# Patient Record
Sex: Female | Born: 1960
Health system: Southern US, Community
[De-identification: ages and names within clinical notes are randomized; demographics above are authoritative.]

## PROBLEM LIST (undated history)

## (undated) DIAGNOSIS — G0490 Encephalitis and encephalomyelitis, unspecified: Secondary | ICD-10-CM

## (undated) DIAGNOSIS — G40409 Other generalized epilepsy and epileptic syndromes, not intractable, without status epilepticus: Secondary | ICD-10-CM

## (undated) DIAGNOSIS — E785 Hyperlipidemia, unspecified: Secondary | ICD-10-CM

## (undated) DIAGNOSIS — G039 Meningitis, unspecified: Secondary | ICD-10-CM

## (undated) DIAGNOSIS — E039 Hypothyroidism, unspecified: Secondary | ICD-10-CM

## (undated) DIAGNOSIS — E7800 Pure hypercholesterolemia, unspecified: Secondary | ICD-10-CM

## (undated) DIAGNOSIS — K219 Gastro-esophageal reflux disease without esophagitis: Secondary | ICD-10-CM

## (undated) DIAGNOSIS — G43909 Migraine, unspecified, not intractable, without status migrainosus: Secondary | ICD-10-CM

## (undated) DIAGNOSIS — K909 Intestinal malabsorption, unspecified: Secondary | ICD-10-CM

## (undated) DIAGNOSIS — R011 Cardiac murmur, unspecified: Secondary | ICD-10-CM

## (undated) DIAGNOSIS — K8020 Calculus of gallbladder without cholecystitis without obstruction: Secondary | ICD-10-CM

## (undated) DIAGNOSIS — R233 Spontaneous ecchymoses: Secondary | ICD-10-CM

## (undated) DIAGNOSIS — K760 Fatty (change of) liver, not elsewhere classified: Secondary | ICD-10-CM

## (undated) DIAGNOSIS — E119 Type 2 diabetes mellitus without complications: Secondary | ICD-10-CM

## (undated) DIAGNOSIS — D509 Iron deficiency anemia, unspecified: Secondary | ICD-10-CM

## (undated) DIAGNOSIS — H409 Unspecified glaucoma: Secondary | ICD-10-CM

## (undated) DIAGNOSIS — H269 Unspecified cataract: Secondary | ICD-10-CM

## (undated) DIAGNOSIS — E042 Nontoxic multinodular goiter: Secondary | ICD-10-CM

## (undated) DIAGNOSIS — R42 Dizziness and giddiness: Secondary | ICD-10-CM

## (undated) DIAGNOSIS — O139 Gestational [pregnancy-induced] hypertension without significant proteinuria, unspecified trimester: Secondary | ICD-10-CM

## (undated) DIAGNOSIS — G4730 Sleep apnea, unspecified: Secondary | ICD-10-CM

## (undated) DIAGNOSIS — J189 Pneumonia, unspecified organism: Secondary | ICD-10-CM

## (undated) DIAGNOSIS — R58 Hemorrhage, not elsewhere classified: Secondary | ICD-10-CM

## (undated) DIAGNOSIS — J42 Unspecified chronic bronchitis: Secondary | ICD-10-CM

## (undated) DIAGNOSIS — K429 Umbilical hernia without obstruction or gangrene: Secondary | ICD-10-CM

## (undated) DIAGNOSIS — R238 Other skin changes: Secondary | ICD-10-CM

## (undated) DIAGNOSIS — Z9289 Personal history of other medical treatment: Secondary | ICD-10-CM

## (undated) HISTORY — DX: Hyperlipidemia, unspecified: E78.5

## (undated) HISTORY — PX: WISDOM TOOTH EXTRACTION: SHX21

## (undated) HISTORY — DX: Hemorrhage, not elsewhere classified: R58

## (undated) HISTORY — DX: Unspecified cataract: H26.9

## (undated) HISTORY — DX: Calculus of gallbladder without cholecystitis without obstruction: K80.20

## (undated) HISTORY — PX: WRIST SURGERY: SHX841

## (undated) HISTORY — DX: Unspecified glaucoma: H40.9

## (undated) HISTORY — DX: Intestinal malabsorption, unspecified: K90.9

## (undated) HISTORY — DX: Nontoxic multinodular goiter: E04.2

## (undated) HISTORY — PX: COLONOSCOPY: SHX174

## (undated) HISTORY — DX: Cardiac murmur, unspecified: R01.1

## (undated) HISTORY — PX: DILATION AND CURETTAGE OF UTERUS: SHX78

## (undated) HISTORY — DX: Fatty (change of) liver, not elsewhere classified: K76.0

## (undated) HISTORY — PX: TONSILLECTOMY AND ADENOIDECTOMY: SUR1326

## (undated) HISTORY — DX: Pure hypercholesterolemia, unspecified: E78.00

## (undated) HISTORY — DX: Umbilical hernia without obstruction or gangrene: K42.9

## (undated) HISTORY — PX: OTHER SURGICAL HISTORY: SHX169

---

## 1987-06-04 HISTORY — PX: CARPAL TUNNEL RELEASE: SHX101

## 1994-06-03 DIAGNOSIS — O139 Gestational [pregnancy-induced] hypertension without significant proteinuria, unspecified trimester: Secondary | ICD-10-CM

## 1994-06-03 HISTORY — DX: Gestational (pregnancy-induced) hypertension without significant proteinuria, unspecified trimester: O13.9

## 1995-06-04 HISTORY — PX: HERNIA REPAIR: SHX51

## 1997-06-03 DIAGNOSIS — G40409 Other generalized epilepsy and epileptic syndromes, not intractable, without status epilepticus: Secondary | ICD-10-CM

## 1997-06-03 DIAGNOSIS — G0490 Encephalitis and encephalomyelitis, unspecified: Secondary | ICD-10-CM

## 1997-06-03 HISTORY — PX: KNEE ARTHROSCOPY W/ MENISCAL REPAIR: SHX1877

## 1997-06-03 HISTORY — DX: Encephalitis and encephalomyelitis, unspecified: G04.90

## 1997-06-03 HISTORY — PX: TUBAL LIGATION: SHX77

## 1997-06-03 HISTORY — DX: Other generalized epilepsy and epileptic syndromes, not intractable, without status epilepticus: G40.409

## 1999-06-04 HISTORY — PX: REDUCTION MAMMAPLASTY: SUR839

## 2006-06-03 DIAGNOSIS — Z9289 Personal history of other medical treatment: Secondary | ICD-10-CM

## 2006-06-03 DIAGNOSIS — D509 Iron deficiency anemia, unspecified: Secondary | ICD-10-CM

## 2006-06-03 HISTORY — DX: Iron deficiency anemia, unspecified: D50.9

## 2006-06-03 HISTORY — PX: ABDOMINAL HYSTERECTOMY: SHX81

## 2006-06-03 HISTORY — DX: Personal history of other medical treatment: Z92.89

## 2007-06-04 HISTORY — PX: OVARIAN CYST REMOVAL: SHX89

## 2008-06-03 DIAGNOSIS — F4321 Adjustment disorder with depressed mood: Secondary | ICD-10-CM

## 2008-06-03 HISTORY — DX: Adjustment disorder with depressed mood: F43.21

## 2009-07-12 ENCOUNTER — Encounter: Admission: RE | Admit: 2009-07-12 | Discharge: 2009-07-12 | Payer: Self-pay | Admitting: Family Medicine

## 2010-03-01 ENCOUNTER — Encounter: Admission: RE | Admit: 2010-03-01 | Discharge: 2010-03-01 | Payer: Self-pay | Admitting: Family Medicine

## 2011-09-17 ENCOUNTER — Other Ambulatory Visit: Payer: Self-pay | Admitting: Endocrinology

## 2011-09-17 DIAGNOSIS — E049 Nontoxic goiter, unspecified: Secondary | ICD-10-CM

## 2011-11-04 ENCOUNTER — Ambulatory Visit
Admission: RE | Admit: 2011-11-04 | Discharge: 2011-11-04 | Disposition: A | Discharge status: Home / Self Care | Payer: BC Managed Care – PPO | Source: Ambulatory Visit | Attending: Endocrinology | Admitting: Endocrinology

## 2011-11-04 DIAGNOSIS — E049 Nontoxic goiter, unspecified: Secondary | ICD-10-CM

## 2012-01-01 ENCOUNTER — Other Ambulatory Visit: Payer: Self-pay | Admitting: Family Medicine

## 2012-01-01 DIAGNOSIS — R7989 Other specified abnormal findings of blood chemistry: Secondary | ICD-10-CM

## 2012-01-22 ENCOUNTER — Ambulatory Visit
Admission: RE | Admit: 2012-01-22 | Discharge: 2012-01-22 | Disposition: A | Discharge status: Home / Self Care | Payer: BC Managed Care – PPO | Source: Ambulatory Visit | Attending: Family Medicine | Admitting: Family Medicine

## 2012-01-22 DIAGNOSIS — R7989 Other specified abnormal findings of blood chemistry: Secondary | ICD-10-CM

## 2012-03-13 ENCOUNTER — Ambulatory Visit
Admission: RE | Admit: 2012-03-13 | Discharge: 2012-03-13 | Disposition: A | Discharge status: Home / Self Care | Payer: BC Managed Care – PPO | Source: Ambulatory Visit | Attending: Family Medicine | Admitting: Family Medicine

## 2012-03-13 ENCOUNTER — Other Ambulatory Visit: Payer: Self-pay | Admitting: Family Medicine

## 2012-03-13 DIAGNOSIS — R1012 Left upper quadrant pain: Secondary | ICD-10-CM

## 2012-03-13 DIAGNOSIS — R1011 Right upper quadrant pain: Secondary | ICD-10-CM

## 2012-03-13 MED ORDER — IOHEXOL 300 MG/ML  SOLN
125.0000 mL | Freq: Once | INTRAMUSCULAR | Status: AC | PRN
  Administered 2012-03-13: 125 mL via INTRAVENOUS

## 2012-03-20 ENCOUNTER — Ambulatory Visit (INDEPENDENT_AMBULATORY_CARE_PROVIDER_SITE_OTHER): Payer: BC Managed Care – PPO | Admitting: General Surgery

## 2012-04-02 ENCOUNTER — Encounter (INDEPENDENT_AMBULATORY_CARE_PROVIDER_SITE_OTHER): Payer: Self-pay | Admitting: General Surgery

## 2012-04-02 ENCOUNTER — Ambulatory Visit (INDEPENDENT_AMBULATORY_CARE_PROVIDER_SITE_OTHER): Payer: BC Managed Care – PPO | Admitting: General Surgery

## 2012-04-02 VITALS — BP 120/76 | HR 88 | Temp 97.9°F | Resp 14 | Ht 62.0 in | Wt 225.6 lb

## 2012-04-02 DIAGNOSIS — K8020 Calculus of gallbladder without cholecystitis without obstruction: Secondary | ICD-10-CM

## 2012-04-02 NOTE — Progress Notes (Signed)
No chief complaint on file.   HISTORY:  Briana Kelley is a 51 y.o. female who presents to clinic with upper abdominal pain.  It is not associated with what she eats.  It is located in her LUQ, mid-epigastrum and RUQ.  It is associated with bloating and some constipation. This has been going on for the past month.  She states that she recently had an EGD and was told her stomach lining was inflamed.  She has been on a PPI for 3 weeks with little change in her symptoms.  She states that she is not able to eat a large amount at any meal due to pain.  She has an extensive surgical history with multiple pelvic surgeries.  The last surgery resulted in a bowel injury that was drained and healed on it's own.    Past Medical History  Diagnosis Date  . Anemia   . Blood transfusion without reported diagnosis   . Diabetes mellitus without complication   . Heart murmur   . Hyperlipidemia   . Seizures   . Thyroid disease        Past Surgical History  Procedure Date  . Abdominal hysterectomy 2008    partial  . Cesarean section [id number]  . Breast surgery 2001    breast reduction  . Hernia repair 1997    umbilical  . Ovarian cyst removal 2009      Current Outpatient Prescriptions  Medication Sig Dispense Refill  . ARMOUR THYROID 90 MG tablet       . atorvastatin (LIPITOR) 10 MG tablet       . butalbital-acetaminophen-caffeine (FIORICET WITH CODEINE) 50-325-40-30 MG per capsule Take 1 capsule by mouth every 4 (four) hours as needed.      . Cholecalciferol (VITAMIN D PO) Take by mouth.      . Flaxseed, Linseed, (FLAX SEED OIL PO) Take by mouth.      Marland Kitchen glimepiride (AMARYL) 2 MG tablet       . KOMBIGLYZE XR 2.10-998 MG TB24       . lisinopril (PRINIVIL,ZESTRIL) 5 MG tablet       . omeprazole (PRILOSEC) 40 MG capsule       . thyroid (ARMOUR) 120 MG tablet Take 120 mg by mouth daily.      . verapamil (CALAN-SR) 240 MG CR tablet       . amitriptyline (ELAVIL) 25 MG tablet          Allergies    Allergen Reactions  . Asa (Aspirin) Shortness Of Breath  . Celebrex (Celecoxib) Shortness Of Breath  . Ultram (Tramadol) Shortness Of Breath  . Pamelor (Nortriptyline Hcl) Other (See Comments)    Jaw pain   . Percocet (Oxycodone-Acetaminophen) Other (See Comments)    halucinations      Family History  Problem Relation Age of Onset  . Breast cancer Mother       History   Social History  . Marital Status: Divorced    Spouse Name: N/A    Number of Children: N/A  . Years of Education: N/A   Social History Main Topics  . Smoking status: Never Smoker   . Smokeless tobacco: Not on file  . Alcohol Use: No  . Drug Use: No  . Sexually Active: Not on file   Other Topics Concern  . Not on file   Social History Narrative  . No narrative on file       REVIEW OF SYSTEMS - PERTINENT POSITIVES ONLY: Review  of Systems - General ROS: negative for - chills, fever or weight loss Hematological and Lymphatic ROS: negative for - bleeding problems, blood clots or jaundice Respiratory ROS: no cough, shortness of breath, or wheezing Cardiovascular ROS: no chest pain or dyspnea on exertion Gastrointestinal ROS: positive for - abdominal pain, constipation and nausea negative for - heartburn or melena Genito-Urinary ROS: no dysuria, trouble voiding, or hematuria  EXAM: Filed Vitals:   04/02/12 1536  BP: 120/76  Pulse: 88  Temp: 97.9 F (36.6 C)  Resp: 14    General appearance: alert, cooperative and no distress Resp: clear to auscultation bilaterally Cardio: regular rate and rhythm GI: soft, tender to palpation in RUQ and mid-epigastrum   LABORATORY RESULTS: Available labs are reviewed  Tbili- 0.5 AST- 48 ALT-57 Alb- 4.1 Amy-26   RADIOLOGY RESULTS:   Images and reports are reviewed. RUQ US IMPRESSION:  1. Multiple gallstones. No pain over the gallbladder. The gallbladder is contracted.  2. Diffuse fatty infiltration of the liver.   Abd CT IMPRESSION:  1. Contracted  gallbladder with gallstones.  2. Hepatic steatosis with sparing along the gallbladder fossa.   ASSESSMENT AND PLAN:  The anatomy & physiology of hepatobiliary & pancreatic function was discussed.  The pathophysiology of gallbladder dysfunction was discussed.  Natural history risks without surgery was discussed.   I feel the risks of no intervention will lead to serious problems that outweigh the operative risks; therefore, I recommended cholecystectomy to remove the pathology.  I explained laparoscopic techniques with possible need for an open approach.  Possible cholangiogram to evaluate the bilary tract was explained as well.    Risks such as bleeding, infection, abscess, leak, injury to other organs, need for further treatment, heart attack, death, and other risks were discussed.  I specifically told her that I was not sure that this will help address the problem of her abd pain as she does not have typical symptoms.   Goals of post-operative recovery were discussed as well.  We will work to minimize all complications.  We discussed her significant adhesions from previous surgery.  I told her that if it did not seem safe to perform a laparoscopic repair that I would do it open.  An educational handout further explaining the pathology and treatment options was given as well.  Questions were answered.  The patient expresses understanding & wishes to proceed with surgery.    Vanita Panda, MD Colon and Rectal Surgery / General Surgery [facility]      Visit Diagnoses: 1. Cholelithiasis     Primary Care Physician: Cala Bradford, MD

## 2012-04-02 NOTE — Patient Instructions (Signed)
Please refer to the educational materials that we gave you.  We will schedule you for surgery.

## 2012-04-14 ENCOUNTER — Telehealth (INDEPENDENT_AMBULATORY_CARE_PROVIDER_SITE_OTHER): Payer: Self-pay

## 2012-04-14 ENCOUNTER — Encounter (INDEPENDENT_AMBULATORY_CARE_PROVIDER_SITE_OTHER): Payer: Self-pay

## 2012-04-14 NOTE — Telephone Encounter (Signed)
I called the pt to let her know I got her message stating she needs a return to work note.  I will get that to her today.  She wants it faxed to [phone] to her attention.  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

## 2012-05-04 ENCOUNTER — Encounter (HOSPITAL_COMMUNITY): Payer: Self-pay

## 2012-05-04 ENCOUNTER — Encounter (HOSPITAL_COMMUNITY)
Admission: RE | Admit: 2012-05-04 | Discharge: 2012-05-04 | Disposition: A | Discharge status: Home / Self Care | Payer: BC Managed Care – PPO | Source: Ambulatory Visit | Attending: General Surgery | Admitting: General Surgery

## 2012-05-04 ENCOUNTER — Ambulatory Visit (HOSPITAL_COMMUNITY)
Admission: RE | Admit: 2012-05-04 | Discharge: 2012-05-04 | Disposition: A | Discharge status: Home / Self Care | Payer: BC Managed Care – PPO | Source: Ambulatory Visit | Attending: General Surgery | Admitting: General Surgery

## 2012-05-04 DIAGNOSIS — Z01818 Encounter for other preprocedural examination: Secondary | ICD-10-CM | POA: Insufficient documentation

## 2012-05-04 DIAGNOSIS — R0989 Other specified symptoms and signs involving the circulatory and respiratory systems: Secondary | ICD-10-CM | POA: Insufficient documentation

## 2012-05-04 DIAGNOSIS — R059 Cough, unspecified: Secondary | ICD-10-CM | POA: Insufficient documentation

## 2012-05-04 DIAGNOSIS — R05 Cough: Secondary | ICD-10-CM | POA: Insufficient documentation

## 2012-05-04 DIAGNOSIS — E119 Type 2 diabetes mellitus without complications: Secondary | ICD-10-CM | POA: Insufficient documentation

## 2012-05-04 DIAGNOSIS — R509 Fever, unspecified: Secondary | ICD-10-CM | POA: Insufficient documentation

## 2012-05-04 HISTORY — DX: Meningitis, unspecified: G03.9

## 2012-05-04 HISTORY — DX: Gastro-esophageal reflux disease without esophagitis: K21.9

## 2012-05-04 HISTORY — DX: Other skin changes: R23.8

## 2012-05-04 HISTORY — DX: Migraine, unspecified, not intractable, without status migrainosus: G43.909

## 2012-05-04 HISTORY — DX: Pneumonia, unspecified organism: J18.9

## 2012-05-04 HISTORY — DX: Dizziness and giddiness: R42

## 2012-05-04 HISTORY — DX: Spontaneous ecchymoses: R23.3

## 2012-05-04 HISTORY — DX: Sleep apnea, unspecified: G47.30

## 2012-05-04 HISTORY — DX: Hypothyroidism, unspecified: E03.9

## 2012-05-04 LAB — BASIC METABOLIC PANEL
BUN: 3 mg/dL — ABNORMAL LOW (ref 6–23)
CO2: 28 mEq/L (ref 19–32)
Calcium: 9.1 mg/dL (ref 8.4–10.5)
Chloride: 106 mEq/L (ref 96–112)
Creatinine, Ser: 0.61 mg/dL (ref 0.50–1.10)
GFR calc Af Amer: >90 mL/min (ref >90)
GFR calc non Af Amer: >90 mL/min (ref >90)
Glucose, Bld: 60 mg/dL — ABNORMAL LOW (ref 70–99)
Potassium: 3.5 mEq/L (ref 3.5–5.1)
Sodium: 144 mEq/L (ref 135–145)

## 2012-05-04 LAB — CBC
HCT: 38.6 % (ref 36.0–46.0)
Hemoglobin: 12.2 g/dL (ref 12.0–15.0)
MCH: 24.8 pg — ABNORMAL LOW (ref 26.0–34.0)
MCHC: 31.6 g/dL (ref 30.0–36.0)
MCV: 78.6 fL (ref 78.0–100.0)
Platelets: 300 10*3/uL (ref 150–400)
RBC: 4.91 MIL/uL (ref 3.87–5.11)
RDW: 17.1 % — ABNORMAL HIGH (ref 11.5–15.5)
WBC: 6.1 10*3/uL (ref 4.0–10.5)

## 2012-05-04 LAB — SURGICAL PCR SCREEN
MRSA, PCR: NEGATIVE
Staphylococcus aureus: NEGATIVE

## 2012-05-04 NOTE — Progress Notes (Signed)
Patient informed Nurse that she had a stress test in [place], [place] with Dr. Lynnae Prude a few years ago. Records requested. Patient denied having a cardiac cath, but did inform Nurse that she had sleep apnea but once she had her tonsils and adenoids removed she no longer wore sleep machine. Patient stated "they told me that I was fine and I haven't worn it since."

## 2012-05-04 NOTE — Pre-Procedure Instructions (Signed)
20 CHAQUANA NICHOLS  05/04/2012   Your procedure is scheduled on: Wednesday May 13, 2012.  Report to [facility] at 0830 AM.  Call this number if you have problems the morning of surgery: [phone]   Remember:   Do not eat food or drink:After Midnight.    Take these medicines the morning of surgery with A SIP OF WATER: Omeprazole (Prilosec), Verapamil (Calan), and Thyroid (Armour)   Do not wear jewelry, make-up or nail polish.  Do not wear lotions, powders, or perfumes. You may NOT wear deodorant.  Do not shave 48 hours prior to surgery.   Do not bring valuables to the hospital.  Contacts, dentures or bridgework may not be worn into surgery.  Leave suitcase in the car. After surgery it may be brought to your room.  For patients admitted to the hospital, checkout time is 11:00 AM the day of discharge.   Patients discharged the day of surgery will not be allowed to drive home.  Name and phone number of your driver:   Special Instructions: Shower using CHG 2 nights before surgery and the night before surgery.  If you shower the day of surgery use CHG.  Use special wash - you have one bottle of CHG for all showers.  You should use approximately 1/3 of the bottle for each shower.   Please read over the following fact sheets that you were given: Pain Booklet, Coughing and Deep Breathing, MRSA Information and Surgical Site Infection Prevention

## 2012-05-11 ENCOUNTER — Encounter (HOSPITAL_COMMUNITY): Payer: Self-pay | Admitting: Pharmacy Technician

## 2012-05-12 MED ORDER — DEXTROSE 5 % IV SOLN
2.0000 g | INTRAVENOUS | Status: AC
  Administered 2012-05-13: 2 g via INTRAVENOUS
  Filled 2012-05-12: qty 2

## 2012-05-13 ENCOUNTER — Ambulatory Visit (HOSPITAL_COMMUNITY): Payer: BC Managed Care – PPO

## 2012-05-13 ENCOUNTER — Encounter (HOSPITAL_COMMUNITY): Payer: Self-pay | Admitting: Certified Registered Nurse Anesthetist

## 2012-05-13 ENCOUNTER — Encounter (HOSPITAL_COMMUNITY): Payer: Self-pay | Admitting: General Practice

## 2012-05-13 ENCOUNTER — Encounter (HOSPITAL_COMMUNITY)
Admission: RE | Disposition: A | Discharge status: Home / Self Care | Payer: Self-pay | Source: Ambulatory Visit | Attending: General Surgery

## 2012-05-13 ENCOUNTER — Encounter (HOSPITAL_COMMUNITY): Payer: Self-pay | Admitting: *Deleted

## 2012-05-13 ENCOUNTER — Ambulatory Visit (HOSPITAL_COMMUNITY): Payer: BC Managed Care – PPO | Admitting: Certified Registered Nurse Anesthetist

## 2012-05-13 ENCOUNTER — Inpatient Hospital Stay (HOSPITAL_COMMUNITY)
Admission: RE | Admit: 2012-05-13 | Discharge: 2012-05-15 | DRG: 494 | Disposition: A | Discharge status: Home / Self Care | Payer: BC Managed Care – PPO | Source: Ambulatory Visit | Attending: General Surgery | Admitting: General Surgery

## 2012-05-13 DIAGNOSIS — G40909 Epilepsy, unspecified, not intractable, without status epilepticus: Secondary | ICD-10-CM | POA: Diagnosis present

## 2012-05-13 DIAGNOSIS — K819 Cholecystitis, unspecified: Secondary | ICD-10-CM

## 2012-05-13 DIAGNOSIS — G4733 Obstructive sleep apnea (adult) (pediatric): Secondary | ICD-10-CM | POA: Diagnosis present

## 2012-05-13 DIAGNOSIS — Z6841 Body Mass Index (BMI) 40.0 and over, adult: Secondary | ICD-10-CM

## 2012-05-13 DIAGNOSIS — E119 Type 2 diabetes mellitus without complications: Secondary | ICD-10-CM | POA: Diagnosis present

## 2012-05-13 DIAGNOSIS — R000 Tachycardia, unspecified: Secondary | ICD-10-CM | POA: Diagnosis not present

## 2012-05-13 DIAGNOSIS — K8010 Calculus of gallbladder with chronic cholecystitis without obstruction: Secondary | ICD-10-CM

## 2012-05-13 DIAGNOSIS — E079 Disorder of thyroid, unspecified: Secondary | ICD-10-CM | POA: Diagnosis present

## 2012-05-13 DIAGNOSIS — K219 Gastro-esophageal reflux disease without esophagitis: Secondary | ICD-10-CM | POA: Diagnosis present

## 2012-05-13 DIAGNOSIS — R030 Elevated blood-pressure reading, without diagnosis of hypertension: Secondary | ICD-10-CM | POA: Diagnosis not present

## 2012-05-13 DIAGNOSIS — K811 Chronic cholecystitis: Principal | ICD-10-CM | POA: Diagnosis present

## 2012-05-13 DIAGNOSIS — Z79899 Other long term (current) drug therapy: Secondary | ICD-10-CM

## 2012-05-13 DIAGNOSIS — E039 Hypothyroidism, unspecified: Secondary | ICD-10-CM | POA: Diagnosis present

## 2012-05-13 HISTORY — DX: Encephalitis and encephalomyelitis, unspecified: G04.90

## 2012-05-13 HISTORY — PX: CHOLECYSTECTOMY: SHX55

## 2012-05-13 HISTORY — DX: Other generalized epilepsy and epileptic syndromes, not intractable, without status epilepticus: G40.409

## 2012-05-13 HISTORY — DX: Type 2 diabetes mellitus without complications: E11.9

## 2012-05-13 HISTORY — DX: Unspecified chronic bronchitis: J42

## 2012-05-13 HISTORY — DX: Gestational (pregnancy-induced) hypertension without significant proteinuria, unspecified trimester: O13.9

## 2012-05-13 HISTORY — DX: Iron deficiency anemia, unspecified: D50.9

## 2012-05-13 HISTORY — DX: Personal history of other medical treatment: Z92.89

## 2012-05-13 LAB — GLUCOSE, CAPILLARY
Glucose-Capillary: 110 mg/dL — ABNORMAL HIGH (ref 70–99)
Glucose-Capillary: 194 mg/dL — ABNORMAL HIGH (ref 70–99)

## 2012-05-13 SURGERY — LAPAROSCOPIC CHOLECYSTECTOMY WITH INTRAOPERATIVE CHOLANGIOGRAM
Anesthesia: General | Site: Abdomen | Wound class: Dirty or Infected

## 2012-05-13 MED ORDER — SODIUM CHLORIDE 0.9 % IR SOLN
Status: DC | PRN
  Administered 2012-05-13: 1

## 2012-05-13 MED ORDER — AMITRIPTYLINE HCL 25 MG PO TABS
25.0000 mg | ORAL_TABLET | Freq: Every day | ORAL | Status: DC
  Administered 2012-05-13 – 2012-05-14 (×2): 25 mg via ORAL
  Filled 2012-05-13 (×3): qty 1

## 2012-05-13 MED ORDER — CHLORHEXIDINE GLUCONATE 4 % EX LIQD: 1.0000 "application " | Freq: Once | CUTANEOUS | Status: DC

## 2012-05-13 MED ORDER — ONDANSETRON HCL 4 MG PO TABS: 4.0000 mg | ORAL_TABLET | Freq: Four times a day (QID) | ORAL | Status: DC | PRN

## 2012-05-13 MED ORDER — LINAGLIPTIN 5 MG PO TABS
5.0000 mg | ORAL_TABLET | Freq: Every day | ORAL | Status: DC
  Administered 2012-05-14 – 2012-05-15 (×2): 5 mg via ORAL
  Filled 2012-05-13 (×2): qty 1

## 2012-05-13 MED ORDER — IOHEXOL 300 MG/ML  SOLN
INTRAMUSCULAR | Status: DC | PRN
  Administered 2012-05-13: 20 mL via INTRAVENOUS

## 2012-05-13 MED ORDER — ROCURONIUM BROMIDE 100 MG/10ML IV SOLN
INTRAVENOUS | Status: DC | PRN
  Administered 2012-05-13 (×2): 10 mg via INTRAVENOUS
  Administered 2012-05-13: 50 mg via INTRAVENOUS

## 2012-05-13 MED ORDER — HYDROCODONE-ACETAMINOPHEN 5-325 MG PO TABS: 1.0000 | ORAL_TABLET | ORAL | Status: DC | PRN

## 2012-05-13 MED ORDER — ONDANSETRON HCL 4 MG/2ML IJ SOLN
INTRAMUSCULAR | Status: AC
  Administered 2012-05-13: 4 mg
  Filled 2012-05-13: qty 2

## 2012-05-13 MED ORDER — METFORMIN HCL ER 500 MG PO TB24
1000.0000 mg | ORAL_TABLET | Freq: Every day | ORAL | Status: DC
  Administered 2012-05-14 – 2012-05-15 (×2): 1000 mg via ORAL
  Filled 2012-05-13 (×3): qty 2

## 2012-05-13 MED ORDER — THYROID 60 MG PO TABS
90.0000 mg | ORAL_TABLET | ORAL | Status: DC
  Filled 2012-05-13: qty 1

## 2012-05-13 MED ORDER — PANTOPRAZOLE SODIUM 40 MG PO TBEC
40.0000 mg | DELAYED_RELEASE_TABLET | Freq: Every day | ORAL | Status: DC
  Administered 2012-05-14 – 2012-05-15 (×2): 40 mg via ORAL
  Filled 2012-05-13 (×3): qty 1

## 2012-05-13 MED ORDER — GLIMEPIRIDE 2 MG PO TABS
2.0000 mg | ORAL_TABLET | Freq: Every day | ORAL | Status: DC
  Administered 2012-05-14 – 2012-05-15 (×2): 2 mg via ORAL
  Filled 2012-05-13 (×3): qty 1

## 2012-05-13 MED ORDER — ONDANSETRON HCL 4 MG/2ML IJ SOLN
INTRAMUSCULAR | Status: DC | PRN
  Administered 2012-05-13: 4 mg via INTRAVENOUS

## 2012-05-13 MED ORDER — FENTANYL CITRATE 0.05 MG/ML IJ SOLN
25.0000 ug | INTRAMUSCULAR | Status: DC | PRN
  Administered 2012-05-13 (×2): 25 ug via INTRAVENOUS

## 2012-05-13 MED ORDER — PROPOFOL 10 MG/ML IV BOLUS
INTRAVENOUS | Status: DC | PRN
  Administered 2012-05-13: 180 mg via INTRAVENOUS

## 2012-05-13 MED ORDER — PROMETHAZINE HCL 25 MG/ML IJ SOLN
6.2500 mg | Freq: Once | INTRAMUSCULAR | Status: AC
  Administered 2012-05-13: 6.25 mg via INTRAVENOUS

## 2012-05-13 MED ORDER — PROMETHAZINE HCL 25 MG/ML IJ SOLN
INTRAMUSCULAR | Status: AC
  Administered 2012-05-13: 6.25 mg via INTRAVENOUS
  Filled 2012-05-13: qty 1

## 2012-05-13 MED ORDER — HYDROMORPHONE HCL PF 1 MG/ML IJ SOLN
1.0000 mg | INTRAMUSCULAR | Status: DC | PRN
  Administered 2012-05-13 – 2012-05-14 (×2): 1 mg via INTRAVENOUS
  Filled 2012-05-13 (×3): qty 1

## 2012-05-13 MED ORDER — HEMOSTATIC AGENTS (NO CHARGE) OPTIME
TOPICAL | Status: DC | PRN
  Administered 2012-05-13: 1 via TOPICAL

## 2012-05-13 MED ORDER — NEOSTIGMINE METHYLSULFATE 1 MG/ML IJ SOLN
INTRAMUSCULAR | Status: DC | PRN
  Administered 2012-05-13: 5 mg via INTRAVENOUS

## 2012-05-13 MED ORDER — ATORVASTATIN CALCIUM 10 MG PO TABS
10.0000 mg | ORAL_TABLET | Freq: Every day | ORAL | Status: DC
  Administered 2012-05-13 – 2012-05-15 (×3): 10 mg via ORAL
  Filled 2012-05-13 (×3): qty 1

## 2012-05-13 MED ORDER — FENTANYL CITRATE 0.05 MG/ML IJ SOLN
INTRAMUSCULAR | Status: AC
  Administered 2012-05-13: 25 ug via INTRAVENOUS
  Filled 2012-05-13: qty 2

## 2012-05-13 MED ORDER — BUPIVACAINE-EPINEPHRINE 0.25% -1:200000 IJ SOLN
INTRAMUSCULAR | Status: DC | PRN
  Administered 2012-05-13: 10 mL

## 2012-05-13 MED ORDER — FENTANYL CITRATE 0.05 MG/ML IJ SOLN
INTRAMUSCULAR | Status: DC | PRN
  Administered 2012-05-13: 100 ug via INTRAVENOUS
  Administered 2012-05-13 (×2): 50 ug via INTRAVENOUS

## 2012-05-13 MED ORDER — THYROID 120 MG PO TABS
120.0000 mg | ORAL_TABLET | ORAL | Status: DC
  Administered 2012-05-14 – 2012-05-15 (×2): 120 mg via ORAL
  Filled 2012-05-13 (×3): qty 1

## 2012-05-13 MED ORDER — THYROID 120 MG PO TABS: 120.0000 mg | ORAL_TABLET | ORAL | Status: DC

## 2012-05-13 MED ORDER — INSULIN ASPART 100 UNIT/ML ~~LOC~~ SOLN
0.0000 [IU] | Freq: Three times a day (TID) | SUBCUTANEOUS | Status: DC
  Administered 2012-05-14: 2 [IU] via SUBCUTANEOUS
  Administered 2012-05-14: 1 [IU] via SUBCUTANEOUS
  Administered 2012-05-14: 2 [IU] via SUBCUTANEOUS
  Administered 2012-05-15: 1 [IU] via SUBCUTANEOUS

## 2012-05-13 MED ORDER — SAXAGLIPTIN-METFORMIN ER 2.5-1000 MG PO TB24: 2.0000 | ORAL_TABLET | Freq: Every day | ORAL | Status: DC

## 2012-05-13 MED ORDER — LACTATED RINGERS IV SOLN
INTRAVENOUS | Status: DC | PRN
  Administered 2012-05-13 (×2): via INTRAVENOUS

## 2012-05-13 MED ORDER — MIDAZOLAM HCL 5 MG/5ML IJ SOLN
INTRAMUSCULAR | Status: DC | PRN
  Administered 2012-05-13: 2 mg via INTRAVENOUS

## 2012-05-13 MED ORDER — BUPIVACAINE-EPINEPHRINE PF 0.25-1:200000 % IJ SOLN
INTRAMUSCULAR | Status: AC
  Filled 2012-05-13: qty 30

## 2012-05-13 MED ORDER — ENOXAPARIN SODIUM 40 MG/0.4ML ~~LOC~~ SOLN
40.0000 mg | SUBCUTANEOUS | Status: DC
  Administered 2012-05-14: 40 mg via SUBCUTANEOUS
  Filled 2012-05-13 (×3): qty 0.4

## 2012-05-13 MED ORDER — PHENYLEPHRINE HCL 10 MG/ML IJ SOLN
INTRAMUSCULAR | Status: DC | PRN
  Administered 2012-05-13 (×5): 40 ug via INTRAVENOUS

## 2012-05-13 MED ORDER — ONDANSETRON HCL 4 MG/2ML IJ SOLN
4.0000 mg | Freq: Four times a day (QID) | INTRAMUSCULAR | Status: DC | PRN
  Administered 2012-05-14 (×2): 4 mg via INTRAVENOUS
  Filled 2012-05-13 (×2): qty 2

## 2012-05-13 MED ORDER — GLYCOPYRROLATE 0.2 MG/ML IJ SOLN
INTRAMUSCULAR | Status: DC | PRN
  Administered 2012-05-13: .8 mg via INTRAVENOUS

## 2012-05-13 MED ORDER — 0.9 % SODIUM CHLORIDE (POUR BTL) OPTIME
TOPICAL | Status: DC | PRN
  Administered 2012-05-13: 1000 mL

## 2012-05-13 MED ORDER — SODIUM CHLORIDE 0.9 % IV SOLN
INTRAVENOUS | Status: DC
  Administered 2012-05-13 – 2012-05-14 (×2): via INTRAVENOUS

## 2012-05-13 MED ORDER — EPHEDRINE SULFATE 50 MG/ML IJ SOLN
INTRAMUSCULAR | Status: DC | PRN
  Administered 2012-05-13 (×4): 5 mg via INTRAVENOUS

## 2012-05-13 MED ORDER — ONDANSETRON HCL 4 MG/2ML IJ SOLN
4.0000 mg | Freq: Four times a day (QID) | INTRAMUSCULAR | Status: AC | PRN
  Administered 2012-05-13: 4 mg via INTRAVENOUS

## 2012-05-13 MED ORDER — LACTATED RINGERS IV SOLN
INTRAVENOUS | Status: DC
  Administered 2012-05-13: 10:00:00 via INTRAVENOUS

## 2012-05-13 MED ORDER — ALBUTEROL SULFATE HFA 108 (90 BASE) MCG/ACT IN AERS
INHALATION_SPRAY | RESPIRATORY_TRACT | Status: DC | PRN
  Administered 2012-05-13: 2 via RESPIRATORY_TRACT

## 2012-05-13 MED ORDER — LIDOCAINE HCL (CARDIAC) 20 MG/ML IV SOLN
INTRAVENOUS | Status: DC | PRN
  Administered 2012-05-13: 60 mg via INTRAVENOUS

## 2012-05-13 MED ORDER — VERAPAMIL HCL ER 240 MG PO TBCR
240.0000 mg | EXTENDED_RELEASE_TABLET | Freq: Every day | ORAL | Status: DC
  Administered 2012-05-14 – 2012-05-15 (×2): 240 mg via ORAL
  Filled 2012-05-13 (×3): qty 1

## 2012-05-13 MED ORDER — LISINOPRIL 5 MG PO TABS
5.0000 mg | ORAL_TABLET | Freq: Every day | ORAL | Status: DC
  Administered 2012-05-13 – 2012-05-15 (×3): 5 mg via ORAL
  Filled 2012-05-13 (×3): qty 1

## 2012-05-13 SURGICAL SUPPLY — 44 items
APPLIER CLIP 5 13 M/L LIGAMAX5 (MISCELLANEOUS) ×3
BANDAGE ADHESIVE 1X3 (GAUZE/BANDAGES/DRESSINGS) ×12 IMPLANT
BENZOIN TINCTURE PRP APPL 2/3 (GAUZE/BANDAGES/DRESSINGS) ×3 IMPLANT
CANISTER SUCTION 2500CC (MISCELLANEOUS) ×3 IMPLANT
CATH REDDICK CHOLANGI 4FR 50CM (CATHETERS) ×3 IMPLANT
CHLORAPREP W/TINT 26ML (MISCELLANEOUS) ×3 IMPLANT
CLIP APPLIE 5 13 M/L LIGAMAX5 (MISCELLANEOUS) ×2 IMPLANT
CLOTH BEACON ORANGE TIMEOUT ST (SAFETY) ×3 IMPLANT
COVER MAYO STAND STRL (DRAPES) IMPLANT
COVER SURGICAL LIGHT HANDLE (MISCELLANEOUS) ×3 IMPLANT
DECANTER SPIKE VIAL GLASS SM (MISCELLANEOUS) ×3 IMPLANT
DRAPE C-ARM 42X72 X-RAY (DRAPES) IMPLANT
DRAPE UTILITY 15X26 W/TAPE STR (DRAPE) ×6 IMPLANT
ELECT REM PT RETURN 9FT ADLT (ELECTROSURGICAL) ×3
ELECTRODE REM PT RTRN 9FT ADLT (ELECTROSURGICAL) ×2 IMPLANT
GLOVE BIO SURGEON STRL SZ 6.5 (GLOVE) ×3 IMPLANT
GLOVE BIO SURGEON STRL SZ7.5 (GLOVE) ×3 IMPLANT
GLOVE BIOGEL PI IND STRL 7.0 (GLOVE) ×6 IMPLANT
GLOVE BIOGEL PI INDICATOR 7.0 (GLOVE) ×3
GLOVE SURG SIGNA 7.5 PF LTX (GLOVE) ×3 IMPLANT
GOWN PREVENTION PLUS XLARGE (GOWN DISPOSABLE) ×3 IMPLANT
GOWN PREVENTION PLUS XXLARGE (GOWN DISPOSABLE) ×3 IMPLANT
GOWN STRL NON-REIN LRG LVL3 (GOWN DISPOSABLE) ×9 IMPLANT
GOWN STRL REIN XL XLG (GOWN DISPOSABLE) ×3 IMPLANT
KIT BASIN OR (CUSTOM PROCEDURE TRAY) ×3 IMPLANT
KIT ROOM TURNOVER OR (KITS) ×3 IMPLANT
MATRIX HEMOSTAT SURGIFLO (HEMOSTASIS) ×3 IMPLANT
NS IRRIG 1000ML POUR BTL (IV SOLUTION) ×3 IMPLANT
PAD ARMBOARD 7.5X6 YLW CONV (MISCELLANEOUS) ×6 IMPLANT
POUCH SPECIMEN RETRIEVAL 10MM (ENDOMECHANICALS) IMPLANT
SCISSORS LAP 5X35 DISP (ENDOMECHANICALS) IMPLANT
SET CHOLANGIOGRAPH 5 50 .035 (SET/KITS/TRAYS/PACK) ×3 IMPLANT
SET IRRIG TUBING LAPAROSCOPIC (IRRIGATION / IRRIGATOR) ×3 IMPLANT
SLEEVE ENDOPATH XCEL 5M (ENDOMECHANICALS) ×6 IMPLANT
SPECIMEN JAR SMALL (MISCELLANEOUS) ×3 IMPLANT
SUT MON AB 4-0 PC3 18 (SUTURE) ×3 IMPLANT
SUT VIC AB 2-0 SH 27 (SUTURE) ×1
SUT VIC AB 2-0 SH 27X BRD (SUTURE) ×2 IMPLANT
TOWEL OR 17X24 6PK STRL BLUE (TOWEL DISPOSABLE) ×3 IMPLANT
TOWEL OR 17X26 10 PK STRL BLUE (TOWEL DISPOSABLE) ×3 IMPLANT
TRAY LAPAROSCOPIC (CUSTOM PROCEDURE TRAY) ×3 IMPLANT
TROCAR XCEL BLUNT TIP 100MML (ENDOMECHANICALS) ×3 IMPLANT
TROCAR XCEL NON-BLD 5MMX100MML (ENDOMECHANICALS) ×3 IMPLANT
WATER STERILE IRR 1000ML POUR (IV SOLUTION) IMPLANT

## 2012-05-13 NOTE — Op Note (Signed)
05/13/2012  6:09 PM  PATIENT:  Briana Kelley  51 y.o. female  Patient Care Team: Cala Bradford, MD as PCP - General (Family Medicine)  PRE-OPERATIVE DIAGNOSIS:  cholelithiasis  POST-OPERATIVE DIAGNOSIS:  Cholecystitis  PROCEDURE:  Procedure(s): LAPAROSCOPIC CHOLECYSTECTOMY WITH INTRAOPERATIVE CHOLANGIOGRAM  SURGEON:  Surgeon(s): Romie Levee, MD Axel Filler, MD  ASSISTANT: Derrell Lolling   ANESTHESIA:   general  EBL:  Total I/O In: 1000 [I.V.:1000] Out: 75 [Urine:75]  DRAINS: none   SPECIMEN:  Source of Specimen:  gallbladder  DISPOSITION OF SPECIMEN:  PATHOLOGY  COUNTS:  YES  PLAN OF CARE: Admit for overnight observation  PATIENT DISPOSITION:  PACU - hemodynamically stable.  INDICATION: This is a 51yoF with upper quadrant pain.  She has a contracted gallbladder with gallstones and this is thought to possibly be the cause of her symptoms.  The anatomy & physiology of hepatobiliary & pancreatic function was discussed.  The pathophysiology of gallbladder dysfunction was discussed.  Natural history risks without surgery was discussed.   I feel the risks of no intervention will lead to serious problems that outweigh the operative risks; therefore, I recommended cholecystectomy to remove the pathology.  I explained laparoscopic techniques with possible need for an open approach.  Probable cholangiogram to evaluate the bilary tract was explained as well.    Risks such as bleeding, infection, abscess, leak, injury to other organs, need for further treatment, heart attack, death, and other risks were discussed.  I noted a good likelihood this will help address the problem.  Possibility that this will not correct all abdominal symptoms was explained.  Goals of post-operative recovery were discussed as well.    OR FINDINGS: Contracted, intrahepatic gallbladder  DESCRIPTION:   The patient was identified & brought into the operating room. The patient was positioned supine  with arms out. SCDs were active during the entire case. The patient underwent general anesthesia without any difficulty.  The abdomen was prepped and draped in a sterile fashion. A Surgical Timeout was performed and confirmed our plan.  We positioned the patient in reverse [place] & right side up.  I placed a Hassan laparoscopic port through the umbilicus using open entry technique.  Entry was clean. There were no adhesions to the anterior abdominal wall supraumbilically.  We induced carbon dioxide insufflation. Camera inspection revealed no injury.    I proceeded to continue with laparoscopic technique. I placed a #5 port in mid subcostal region, another 5mm port in the right flank near the anterior axillary line, and a 5mm port in the left subxiphoid region obliquely within the falciform ligament.  I turned attention to the right upper quadrant.  The gallbladder fundus was contracted and difficult to elevate cephalad. I used cautery and blunt dissection to free the peritoneal coverings between the gallbladder and the liver on the posteriolateral and anteriomedial walls.   I used careful blunt and cautery dissection with a maryland dissector to help get a good critical view of the cystic artery and cystic duct. I did further dissection to free a few centimeters of the  gallbladder off the liver bed to get a good critical view of the infundibulum and cystic duct. I mobilized the cystic artery.  I skeletonized the cystic duct.  After getting a good 360 view, I decided to perform a cholangiogram.  I placed a clip on the infundibulum.   I did a partial cystic duct-otomy and ensured patency. I placed a 5 French balloon cholangiocatheter through a puncture site at the  right subcostal ridge of the abdominal wall and directed it into the cystic duct.  We ran a cholangiogram with dilute radio-opaque contrast and continuous fluoroscopy.  Contrast flowed from a side branch consistent with cystic duct  cannulization. Contrast flowed up the common hepatic duct into the right and left intrahepatic chains out to secondary radicals. Contrast flowed down the common bile duct easily across the normal ampulla into the duodenum.  This was consistent with a normal cholangiogram.  I removed the cholangiocatheter.  I placed clips on the cystic duct x3.  I completed cystic duct transection.   I placed clips on the cystic artery x3 with 2 proximally.  I ligated the cystic artery using scissors. I freed the gallbladder from its remaining attachments to the liver.  The gallbladder was significantly intrahepatic and the resection was difficult.  I ensured hemostasis on the gallbladder fossa of the liver and elsewhere. I inspected the rest of the abdomen & detected no injury nor bleeding elsewhere.  I irrigated the RUQ with normal saline and placed fibular for added hemostasis.  I removed the gallbladder through the umbilical port site.  I closed the umbilical fascia using 0 Vicryl stitches.   I closed the skin using 2-0 and 4-0 vicryl stitches.  Sterile dressings were applied. The patient was extubated & arrived in the PACU in stable condition.  I had discussed postoperative care with the patient in the holding area.   I will discuss  operative findings and postoperative goals / instructions with the patient's family.  Instructions are written in the chart as well.  Due to the difficult nature of the case, and some desaturations during the case, I am going to admit her for overnight observation.

## 2012-05-13 NOTE — Anesthesia Postprocedure Evaluation (Signed)
Anesthesia Post Note  Patient: Briana Kelley  Procedure(s) Performed: Procedure(s) (LRB): LAPAROSCOPIC CHOLECYSTECTOMY WITH INTRAOPERATIVE CHOLANGIOGRAM (N/A)  Anesthesia type: General  Patient location: PACU  Post pain: Pain level controlled and Adequate analgesia  Post assessment: Post-op Vital signs reviewed, Patient's Cardiovascular Status Stable, Respiratory Function Stable, Patent Airway and Pain level controlled  Last Vitals:  Filed Vitals:   05/13/12 1500  BP: 135/75  Pulse: 84  Temp:   Resp: 26    Post vital signs: Reviewed and stable  Level of consciousness: awake, alert  and oriented  Complications: No apparent anesthesia complications

## 2012-05-13 NOTE — Transfer of Care (Signed)
Immediate Anesthesia Transfer of Care Note  Patient: Briana Kelley  Procedure(s) Performed: Procedure(s) (LRB) with comments: LAPAROSCOPIC CHOLECYSTECTOMY WITH INTRAOPERATIVE CHOLANGIOGRAM (N/A)  Patient Location: PACU  Anesthesia Type:General  Level of Consciousness: awake, oriented, sedated and patient cooperative  Airway & Oxygen Therapy: Patient Spontanous Breathing and Patient connected to face mask oxygen  Post-op Assessment: Report given to PACU RN, Post -op Vital signs reviewed and stable and Patient moving all extremities  Post vital signs: Reviewed and stable  Complications: No apparent anesthesia complications

## 2012-05-13 NOTE — Preoperative (Signed)
Beta Blockers   Reason not to administer Beta Blockers:Not Applicable 

## 2012-05-13 NOTE — Anesthesia Preprocedure Evaluation (Signed)
Anesthesia Evaluation  Patient identified by MRN, date of birth, ID band Patient awake    Reviewed: Allergy & Precautions, H&P , NPO status , Patient's Chart, lab work & pertinent test results  Airway Mallampati: III  Neck ROM: full    Dental   Pulmonary sleep apnea ,          Cardiovascular     Neuro/Psych  Headaches, Seizures -,     GI/Hepatic GERD-  ,  Endo/Other  diabetes, Type 2Hypothyroidism Morbid obesity  Renal/GU      Musculoskeletal   Abdominal   Peds  Hematology   Anesthesia Other Findings   Reproductive/Obstetrics                           Anesthesia Physical Anesthesia Plan  ASA: III  Anesthesia Plan: General   Post-op Pain Management:    Induction: Intravenous  Airway Management Planned: Oral ETT  Additional Equipment:   Intra-op Plan:   Post-operative Plan: Extubation in OR  Informed Consent: I have reviewed the patients History and Physical, chart, labs and discussed the procedure including the risks, benefits and alternatives for the proposed anesthesia with the patient or authorized representative who has indicated his/her understanding and acceptance.     Plan Discussed with: CRNA and Surgeon  Anesthesia Plan Comments:         Anesthesia Quick Evaluation

## 2012-05-13 NOTE — Progress Notes (Signed)
Arrived to room 9 from pacu, sleepy but easily arousable, denies nausea, c/o abd pain 5/10, unable to move self from stretcher to bed, mother at bedside, oriented to room and surroundings

## 2012-05-13 NOTE — Anesthesia Procedure Notes (Signed)
Procedure Name: Intubation Date/Time: 05/13/2012 12:05 PM Performed by: Angelica Pou Pre-anesthesia Checklist: Patient identified, Timeout performed, Emergency Drugs available, Suction available and Patient being monitored Patient Re-evaluated:Patient Re-evaluated prior to inductionOxygen Delivery Method: Circle system utilized Preoxygenation: Pre-oxygenation with 100% oxygen Intubation Type: IV induction Ventilation: Mask ventilation without difficulty and Oral airway inserted - appropriate to patient size Laryngoscope Size: Mac and 3 Grade View: Grade I Tube type: Oral Number of attempts: 1 Airway Equipment and Method: Stylet and Oral airway Placement Confirmation: ETT inserted through vocal cords under direct vision,  breath sounds checked- equal and bilateral and positive ETCO2 Secured at: 21 cm Tube secured with: Tape Dental Injury: Teeth and Oropharynx as per pre-operative assessment  Comments: Positioned to pt comfort using a shoulder roll prior to induction.

## 2012-05-13 NOTE — H&P (Signed)
No chief complaint on file.  HISTORY: Briana Kelley is a 51 y.o. female who presents to clinic with upper abdominal pain. It is not associated with what she eats. It is located in her LUQ, mid-epigastrum and RUQ. It is associated with bloating and some constipation. This has been going on for the past month. She states that she recently had an EGD and was told her stomach lining was inflamed. She has been on a PPI for 3 weeks with little change in her symptoms. She states that she is not able to eat a large amount at any meal due to pain. She has an extensive surgical history with multiple pelvic surgeries. The last surgery resulted in a bowel injury that was drained and healed on it's own.   Of note the patient complains of recent hematuria that has resolved, no pain or fevers.  We will get UA if her urine is bloody in PACU  Past Medical History   Diagnosis  Date   .  Anemia    .  Blood transfusion without reported diagnosis    .  Diabetes mellitus without complication    .  Heart murmur    .  Hyperlipidemia    .  Seizures    .  Thyroid disease     Past Surgical History   Procedure  Date   .  Abdominal hysterectomy  2008     partial   .  Cesarean section  [id number]   .  Breast surgery  2001     breast reduction   .  Hernia repair  1997     umbilical   .  Ovarian cyst removal  2009    Current Outpatient Prescriptions   Medication  Sig  Dispense  Refill   .  ARMOUR THYROID 90 MG tablet      .  atorvastatin (LIPITOR) 10 MG tablet      .  butalbital-acetaminophen-caffeine (FIORICET WITH CODEINE) 50-325-40-30 MG per capsule  Take 1 capsule by mouth every 4 (four) hours as needed.     .  Cholecalciferol (VITAMIN D PO)  Take by mouth.     .  Flaxseed, Linseed, (FLAX SEED OIL PO)  Take by mouth.     Marland Kitchen  glimepiride (AMARYL) 2 MG tablet      .  KOMBIGLYZE XR 2.10-998 MG TB24      .  lisinopril (PRINIVIL,ZESTRIL) 5 MG tablet      .  omeprazole (PRILOSEC) 40 MG capsule      .  thyroid  (ARMOUR) 120 MG tablet  Take 120 mg by mouth daily.     .  verapamil (CALAN-SR) 240 MG CR tablet      .  amitriptyline (ELAVIL) 25 MG tablet       Allergies   Allergen  Reactions   .  Asa (Aspirin)  Shortness Of Breath   .  Celebrex (Celecoxib)  Shortness Of Breath   .  Ultram (Tramadol)  Shortness Of Breath   .  Pamelor (Nortriptyline Hcl)  Other (See Comments)     Jaw pain   .  Percocet (Oxycodone-Acetaminophen)  Other (See Comments)     halucinations    Family History   Problem  Relation  Age of Onset   .  Breast cancer  Mother     History    Social History   .  Marital Status:  Divorced     Spouse Name:  N/A     Number  of Children:  N/A   .  Years of Education:  N/A    Social History Main Topics   .  Smoking status:  Never Smoker   .  Smokeless tobacco:  Not on file   .  Alcohol Use:  No   .  Drug Use:  No   .  Sexually Active:  Not on file    Other Topics  Concern   .  Not on file    Social History Narrative   .  No narrative on file   REVIEW OF SYSTEMS - PERTINENT POSITIVES ONLY:  Review of Systems - General ROS: negative for - chills, fever or weight loss  Hematological and Lymphatic ROS: negative for - bleeding problems, blood clots or jaundice  Respiratory ROS: no cough, shortness of breath, or wheezing  Cardiovascular ROS: no chest pain or dyspnea on exertion  Gastrointestinal ROS: positive for - abdominal pain, constipation and nausea  negative for - heartburn or melena  Genito-Urinary ROS: no dysuria, trouble voiding, or hematuria  EXAM: Filed Vitals:   05/13/12 0841  BP: 120/78  Pulse: 92  Temp: 98.2 F (36.8 C)  Resp: 18     General appearance: alert, cooperative and no distress  Resp: clear to auscultation bilaterally  Cardio: regular rate and rhythm  GI: soft, tender to palpation in RUQ and mid-epigastrum  LABORATORY RESULTS:  Available labs are reviewed  Tbili- 0.5  AST- 48  ALT-57  Alb- 4.1  Amy-26  RADIOLOGY RESULTS:  Images and  reports are reviewed.  RUQ US IMPRESSION:  1. Multiple gallstones. No pain over the gallbladder. The gallbladder is contracted.  2. Diffuse fatty infiltration of the liver.  Abd CT IMPRESSION:  1. Contracted gallbladder with gallstones.  2. Hepatic steatosis with sparing along the gallbladder fossa.  ASSESSMENT AND PLAN:  The anatomy & physiology of hepatobiliary & pancreatic function was discussed. The pathophysiology of gallbladder dysfunction was discussed. Natural history risks without surgery was discussed. I feel the risks of no intervention will lead to serious problems that outweigh the operative risks; therefore, I recommended cholecystectomy to remove the pathology. I explained laparoscopic techniques with possible need for an open approach. Possible cholangiogram to evaluate the bilary tract was explained as well.  Risks such as bleeding, infection, abscess, leak, injury to other organs, need for further treatment, heart attack, death, and other risks were discussed. I specifically told her that I was not sure that this will help address the problem of her abd pain as she does not have typical symptoms. Goals of post-operative recovery were discussed as well. We will work to minimize all complications. We discussed her significant adhesions from previous surgery. I told her that if it did not seem safe to perform a laparoscopic repair that I would do it open. An educational handout further explaining the pathology and treatment options was given as well. Questions were answered. The patient expresses understanding & wishes to proceed with surgery.  Vanita Panda, MD  Colon and Rectal Surgery / General Surgery  [facility]

## 2012-05-13 NOTE — Progress Notes (Signed)
Report given to kay rn as caregiver 

## 2012-05-14 ENCOUNTER — Inpatient Hospital Stay (HOSPITAL_COMMUNITY): Payer: BC Managed Care – PPO

## 2012-05-14 LAB — GLUCOSE, CAPILLARY
Glucose-Capillary: 151 mg/dL — ABNORMAL HIGH (ref 70–99)
Glucose-Capillary: 170 mg/dL — ABNORMAL HIGH (ref 70–99)
Glucose-Capillary: 152 mg/dL — ABNORMAL HIGH (ref 70–99)
Glucose-Capillary: 136 mg/dL — ABNORMAL HIGH (ref 70–99)
Glucose-Capillary: 143 mg/dL — ABNORMAL HIGH (ref 70–99)

## 2012-05-14 LAB — CBC
HCT: 34.5 % — ABNORMAL LOW (ref 36.0–46.0)
Hemoglobin: 11.1 g/dL — ABNORMAL LOW (ref 12.0–15.0)
MCH: 24.8 pg — ABNORMAL LOW (ref 26.0–34.0)
MCHC: 32.2 g/dL (ref 30.0–36.0)
MCV: 77.2 fL — ABNORMAL LOW (ref 78.0–100.0)
Platelets: 406 10*3/uL — ABNORMAL HIGH (ref 150–400)
RBC: 4.47 MIL/uL (ref 3.87–5.11)
RDW: 17 % — ABNORMAL HIGH (ref 11.5–15.5)
WBC: 22.6 10*3/uL — ABNORMAL HIGH (ref 4.0–10.5)

## 2012-05-14 MED ORDER — HYDROCODONE-ACETAMINOPHEN 5-325 MG PO TABS: 1.0000 | ORAL_TABLET | ORAL | Status: DC | PRN

## 2012-05-14 MED ORDER — IBUPROFEN 600 MG PO TABS
600.0000 mg | ORAL_TABLET | Freq: Three times a day (TID) | ORAL | Status: DC | PRN
  Administered 2012-05-15 (×2): 600 mg via ORAL
  Filled 2012-05-14 (×4): qty 1

## 2012-05-14 MED ORDER — BUTALBITAL-APAP-CAFFEINE 50-325-40 MG PO TABS
1.0000 | ORAL_TABLET | ORAL | Status: DC | PRN
  Administered 2012-05-14 – 2012-05-15 (×2): 1 via ORAL
  Filled 2012-05-14 (×2): qty 1

## 2012-05-14 NOTE — Progress Notes (Signed)
1 Day Post-Op lap chole Subjective: Coughing overnight, sipping on liquids  Objective: Vital signs in last 24 hours: Temp:  [97.1 F (36.2 C)-99.3 F (37.4 C)] 99.3 F (37.4 C) (12/12 1009) Pulse Rate:  [79-117] 113  (12/12 1009) Resp:  [17-28] 18  (12/12 1009) BP: (121-164)/(63-105) 132/77 mmHg (12/12 1009) SpO2:  [92 %-98 %] 95 % (12/12 1009) Weight:  [225 lb (102.059 kg)] 225 lb (102.059 kg) (12/12 0818)   Intake/Output from previous day: 12/11 0701 - 12/12 0700 In: 1000 [I.V.:1000] Out: 75 [Urine:75] Intake/Output this shift:     General appearance: alert and cooperative Resp: clear to auscultation bilaterally GI: soft, appropriately tender  Incision: no significant drainage  Lab Results:   Basename 05/14/12 0525  WBC 22.6*  HGB 11.1*  HCT 34.5*  PLT 406*   BMET No results found for this basename: NA:2,K:2,CL:2,CO2:2,GLUCOSE:2,BUN:2,CREATININE:2,CALCIUM:2 in the last 72 hours PT/INR No results found for this basename: LABPROT:2,INR:2 in the last 72 hours ABG No results found for this basename: PHART:2,PCO2:2,PO2:2,HCO3:2 in the last 72 hours  MEDS, Scheduled    . amitriptyline  25 mg Oral QHS  . atorvastatin  10 mg Oral Daily  . [COMPLETED] cefOXitin  2 g Intravenous On Call to OR  . enoxaparin (LOVENOX) injection  40 mg Subcutaneous Q24H  . glimepiride  2 mg Oral QAC breakfast  . insulin aspart  0-9 Units Subcutaneous TID WC  . linagliptin  5 mg Oral Daily  . lisinopril  5 mg Oral Daily  . metFORMIN  1,000 mg Oral Q breakfast  . [COMPLETED] ondansetron      . pantoprazole  40 mg Oral Daily  . [COMPLETED] promethazine  6.25 mg Intravenous Once  . thyroid  120 mg Oral Custom  . thyroid  90 mg Oral Custom  . verapamil  240 mg Oral Daily  . [DISCONTINUED] chlorhexidine  1 application Topical Once  . [DISCONTINUED] Saxagliptin-Metformin  2 tablet Oral Daily  . [DISCONTINUED] thyroid  120 mg Oral See admin instructions  . [DISCONTINUED] thyroid  90 mg  Oral 2 times weekly    Studies/Results: Dg Cholangiogram Operative  05/13/2012  *RADIOLOGY REPORT*  Clinical Data:   Cholelithiasis  INTRAOPERATIVE CHOLANGIOGRAM  Technique:  Cholangiographic images from the C-arm fluoroscopic device were submitted for interpretation post-operatively.  Please see the procedural report for the amount of contrast and the fluoroscopy time utilized.  Comparison:  None  Findings:  No persistent filling defects in the common duct. Intrahepatic ducts are incompletely visualized, appearing decompressed centrally. Contrast passes into the duodenum.  IMPRESSION  Negative for retained common duct stone.   Original Report Authenticated By: D. Andria Rhein, MD     Assessment: s/p Procedure(s): LAPAROSCOPIC CHOLECYSTECTOMY WITH INTRAOPERATIVE CHOLANGIOGRAM Patient Active Problem List  Diagnosis  . Cholecystitis      Plan: Advance diet Pt with desats in OR and coughing, will check CXR to make sure no major pathology is causing this Advance diet as tolerated Pt may go home later today if she tolerates a diet and her pain is controlled with PO meds   LOS: 1 day     .Vanita Panda, MD [facility] Surgery, [place] [phone]   05/14/2012 11:44 AM

## 2012-05-15 ENCOUNTER — Encounter (HOSPITAL_COMMUNITY): Payer: Self-pay | Admitting: General Surgery

## 2012-05-15 LAB — CBC
HCT: 33.2 % — ABNORMAL LOW (ref 36.0–46.0)
Hemoglobin: 10.5 g/dL — ABNORMAL LOW (ref 12.0–15.0)
MCH: 24.4 pg — ABNORMAL LOW (ref 26.0–34.0)
MCHC: 31.6 g/dL (ref 30.0–36.0)
MCV: 77 fL — ABNORMAL LOW (ref 78.0–100.0)
Platelets: 349 10*3/uL (ref 150–400)
RBC: 4.31 MIL/uL (ref 3.87–5.11)
RDW: 17.1 % — ABNORMAL HIGH (ref 11.5–15.5)
WBC: 19.8 10*3/uL — ABNORMAL HIGH (ref 4.0–10.5)

## 2012-05-15 LAB — COMPREHENSIVE METABOLIC PANEL
ALT: 27 U/L (ref 0–35)
AST: 23 U/L (ref 0–37)
Albumin: 2.8 g/dL — ABNORMAL LOW (ref 3.5–5.2)
Alkaline Phosphatase: 84 U/L (ref 39–117)
BUN: 10 mg/dL (ref 6–23)
CO2: 25 mEq/L (ref 19–32)
Calcium: 8.9 mg/dL (ref 8.4–10.5)
Chloride: 101 mEq/L (ref 96–112)
Creatinine, Ser: 0.47 mg/dL — ABNORMAL LOW (ref 0.50–1.10)
GFR calc Af Amer: >90 mL/min (ref >90)
GFR calc non Af Amer: >90 mL/min (ref >90)
Glucose, Bld: 161 mg/dL — ABNORMAL HIGH (ref 70–99)
Potassium: 2.9 mEq/L — ABNORMAL LOW (ref 3.5–5.1)
Sodium: 138 mEq/L (ref 135–145)
Total Bilirubin: 0.6 mg/dL (ref 0.3–1.2)
Total Protein: 6.6 g/dL (ref 6.0–8.3)

## 2012-05-15 LAB — GLUCOSE, CAPILLARY
Glucose-Capillary: 129 mg/dL — ABNORMAL HIGH (ref 70–99)
Glucose-Capillary: 120 mg/dL — ABNORMAL HIGH (ref 70–99)
Glucose-Capillary: 115 mg/dL — ABNORMAL HIGH (ref 70–99)

## 2012-05-15 MED ORDER — IBUPROFEN 600 MG PO TABS: 600.0000 mg | ORAL_TABLET | Freq: Three times a day (TID) | ORAL | Status: DC | PRN

## 2012-05-15 MED ORDER — POTASSIUM CHLORIDE CRYS ER 20 MEQ PO TBCR
40.0000 meq | EXTENDED_RELEASE_TABLET | Freq: Every day | ORAL | Status: DC
  Administered 2012-05-15: 40 meq via ORAL
  Filled 2012-05-15: qty 2

## 2012-05-15 NOTE — Progress Notes (Signed)
2 Days Post-Op lap chole Subjective: Looks better today, slightly tachy and hypertensive but refuses narcotics  Objective: Vital signs in last 24 hours: Temp:  [99.4 F (37.4 C)-100.4 F (38 C)] 99.4 F (37.4 C) (12/13 1440) Pulse Rate:  [99-122] 99  (12/13 1440) Resp:  [17-19] 18  (12/13 1440) BP: (116-152)/(68-94) 116/68 mmHg (12/13 1440) SpO2:  [92 %] 92 % (12/13 1440)   Intake/Output from previous day: 12/12 0701 - 12/13 0700 In: 1663.5 [P.O.:240; I.V.:1423.5] Out: 400 [Urine:400] Intake/Output this shift: Total I/O In: -  Out: 800 [Urine:800]   General appearance: alert and cooperative Resp: clear to auscultation bilaterally GI: soft, appropriately tender  Incision: no significant drainage  Lab Results:   [facility] 05/15/12 0943 05/14/12 0525  WBC 19.8* 22.6*  HGB 10.5* 11.1*  HCT 33.2* 34.5*  PLT 349 406*   BMET  Basename 05/15/12 0943  NA 138  K 2.9*  CL 101  CO2 25  GLUCOSE 161*  BUN 10  CREATININE 0.47*  CALCIUM 8.9   PT/INR No results found for this basename: LABPROT:2,INR:2 in the last 72 hours ABG No results found for this basename: PHART:2,PCO2:2,PO2:2,HCO3:2 in the last 72 hours  MEDS, Scheduled    . amitriptyline  25 mg Oral QHS  . atorvastatin  10 mg Oral Daily  . enoxaparin (LOVENOX) injection  40 mg Subcutaneous Q24H  . glimepiride  2 mg Oral QAC breakfast  . insulin aspart  0-9 Units Subcutaneous TID WC  . linagliptin  5 mg Oral Daily  . lisinopril  5 mg Oral Daily  . metFORMIN  1,000 mg Oral Q breakfast  . pantoprazole  40 mg Oral Daily  . potassium chloride  40 mEq Oral Daily  . thyroid  120 mg Oral Custom  . thyroid  90 mg Oral Custom  . verapamil  240 mg Oral Daily    Studies/Results: Dg Chest Port 1 View  05/14/2012  *RADIOLOGY REPORT*  Clinical Data: Cough, oxygen desaturation, vomiting, recent cholecystectomy.  PORTABLE CHEST - 1 VIEW  Comparison: Chest x-ray of 05/04/2012  Findings: The lungs are not as well  aerated with mild basilar atelectasis.  The heart is within upper limits of normal.  No bony abnormality is seen.  IMPRESSION: Diminished aeration with mild basilar atelectasis.   Original Report Authenticated By: Dwyane Dee, M.D.     Assessment: s/p Procedure(s): LAPAROSCOPIC CHOLECYSTECTOMY WITH INTRAOPERATIVE CHOLANGIOGRAM Patient Active Problem List  Diagnosis  . Cholecystitis      Plan: Tolerating a diet.  Pain better.  CXR showed no pathology.  Ok for d/c home.    LOS: 2 days     .Briana Panda, MD [facility] Surgery, [place] [phone]   05/15/2012 6:35 PM

## 2012-05-16 ENCOUNTER — Encounter (HOSPITAL_COMMUNITY): Payer: Self-pay

## 2012-05-16 ENCOUNTER — Inpatient Hospital Stay (HOSPITAL_COMMUNITY)
Admission: EM | Admit: 2012-05-16 | Discharge: 2012-05-21 | DRG: 584 | Disposition: A | Discharge status: Home / Self Care | Payer: BC Managed Care – PPO | Attending: Internal Medicine | Admitting: Internal Medicine

## 2012-05-16 ENCOUNTER — Telehealth (INDEPENDENT_AMBULATORY_CARE_PROVIDER_SITE_OTHER): Payer: Self-pay | Admitting: General Surgery

## 2012-05-16 ENCOUNTER — Emergency Department (HOSPITAL_COMMUNITY): Payer: BC Managed Care – PPO

## 2012-05-16 DIAGNOSIS — G43909 Migraine, unspecified, not intractable, without status migrainosus: Secondary | ICD-10-CM | POA: Diagnosis present

## 2012-05-16 DIAGNOSIS — G4733 Obstructive sleep apnea (adult) (pediatric): Secondary | ICD-10-CM | POA: Diagnosis present

## 2012-05-16 DIAGNOSIS — R0602 Shortness of breath: Secondary | ICD-10-CM | POA: Diagnosis present

## 2012-05-16 DIAGNOSIS — A498 Other bacterial infections of unspecified site: Secondary | ICD-10-CM | POA: Diagnosis present

## 2012-05-16 DIAGNOSIS — J189 Pneumonia, unspecified organism: Secondary | ICD-10-CM | POA: Diagnosis present

## 2012-05-16 DIAGNOSIS — K651 Peritoneal abscess: Secondary | ICD-10-CM | POA: Diagnosis present

## 2012-05-16 DIAGNOSIS — R509 Fever, unspecified: Secondary | ICD-10-CM

## 2012-05-16 DIAGNOSIS — Z8701 Personal history of pneumonia (recurrent): Secondary | ICD-10-CM

## 2012-05-16 DIAGNOSIS — K819 Cholecystitis, unspecified: Secondary | ICD-10-CM

## 2012-05-16 DIAGNOSIS — K219 Gastro-esophageal reflux disease without esophagitis: Secondary | ICD-10-CM | POA: Diagnosis present

## 2012-05-16 DIAGNOSIS — A419 Sepsis, unspecified organism: Principal | ICD-10-CM | POA: Diagnosis present

## 2012-05-16 DIAGNOSIS — T8143XA Infection following a procedure, organ and space surgical site, initial encounter: Secondary | ICD-10-CM | POA: Diagnosis present

## 2012-05-16 DIAGNOSIS — E785 Hyperlipidemia, unspecified: Secondary | ICD-10-CM | POA: Diagnosis present

## 2012-05-16 DIAGNOSIS — E876 Hypokalemia: Secondary | ICD-10-CM | POA: Diagnosis present

## 2012-05-16 DIAGNOSIS — R109 Unspecified abdominal pain: Secondary | ICD-10-CM | POA: Diagnosis present

## 2012-05-16 DIAGNOSIS — N390 Urinary tract infection, site not specified: Secondary | ICD-10-CM | POA: Diagnosis present

## 2012-05-16 DIAGNOSIS — D72829 Elevated white blood cell count, unspecified: Secondary | ICD-10-CM | POA: Diagnosis present

## 2012-05-16 DIAGNOSIS — Z6841 Body Mass Index (BMI) 40.0 and over, adult: Secondary | ICD-10-CM

## 2012-05-16 DIAGNOSIS — E119 Type 2 diabetes mellitus without complications: Secondary | ICD-10-CM | POA: Diagnosis present

## 2012-05-16 DIAGNOSIS — T8140XA Infection following a procedure, unspecified, initial encounter: Secondary | ICD-10-CM | POA: Diagnosis present

## 2012-05-16 DIAGNOSIS — E669 Obesity, unspecified: Secondary | ICD-10-CM | POA: Diagnosis present

## 2012-05-16 DIAGNOSIS — E039 Hypothyroidism, unspecified: Secondary | ICD-10-CM | POA: Diagnosis present

## 2012-05-16 LAB — URINALYSIS, ROUTINE W REFLEX MICROSCOPIC
Glucose, UA: NEGATIVE mg/dL
Hgb urine dipstick: NEGATIVE
Ketones, ur: >80 mg/dL — AB
Nitrite: POSITIVE — AB
Protein, ur: 100 mg/dL — AB
Specific Gravity, Urine: 1.031 — ABNORMAL HIGH (ref 1.005–1.030)
Urobilinogen, UA: 1 mg/dL (ref 0.0–1.0)
pH: 6 (ref 5.0–8.0)

## 2012-05-16 LAB — CBC WITH DIFFERENTIAL/PLATELET
Basophils Absolute: 0.1 10*3/uL (ref 0.0–0.1)
Basophils Relative: 0 % (ref 0–1)
Eosinophils Absolute: 0.3 10*3/uL (ref 0.0–0.7)
Eosinophils Relative: 2 % (ref 0–5)
HCT: 34.2 % — ABNORMAL LOW (ref 36.0–46.0)
Hemoglobin: 10.8 g/dL — ABNORMAL LOW (ref 12.0–15.0)
Lymphocytes Relative: 20 % (ref 12–46)
Lymphs Abs: 2.8 10*3/uL (ref 0.7–4.0)
MCH: 24.3 pg — ABNORMAL LOW (ref 26.0–34.0)
MCHC: 31.6 g/dL (ref 30.0–36.0)
MCV: 77 fL — ABNORMAL LOW (ref 78.0–100.0)
Monocytes Absolute: 1.5 10*3/uL — ABNORMAL HIGH (ref 0.1–1.0)
Monocytes Relative: 11 % (ref 3–12)
Neutro Abs: 9.5 10*3/uL — ABNORMAL HIGH (ref 1.7–7.7)
Neutrophils Relative %: 67 % (ref 43–77)
Platelets: 368 10*3/uL (ref 150–400)
RBC: 4.44 MIL/uL (ref 3.87–5.11)
RDW: 16.9 % — ABNORMAL HIGH (ref 11.5–15.5)
WBC: 14.2 10*3/uL — ABNORMAL HIGH (ref 4.0–10.5)

## 2012-05-16 LAB — COMPREHENSIVE METABOLIC PANEL
ALT: 18 U/L (ref 0–35)
AST: 16 U/L (ref 0–37)
Albumin: 2.6 g/dL — ABNORMAL LOW (ref 3.5–5.2)
Alkaline Phosphatase: 85 U/L (ref 39–117)
BUN: 7 mg/dL (ref 6–23)
CO2: 22 mEq/L (ref 19–32)
Calcium: 9.3 mg/dL (ref 8.4–10.5)
Chloride: 106 mEq/L (ref 96–112)
Creatinine, Ser: 0.46 mg/dL — ABNORMAL LOW (ref 0.50–1.10)
GFR calc Af Amer: >90 mL/min (ref >90)
GFR calc non Af Amer: >90 mL/min (ref >90)
Glucose, Bld: 83 mg/dL (ref 70–99)
Potassium: 3.3 mEq/L — ABNORMAL LOW (ref 3.5–5.1)
Sodium: 142 mEq/L (ref 135–145)
Total Bilirubin: 0.4 mg/dL (ref 0.3–1.2)
Total Protein: 6.8 g/dL (ref 6.0–8.3)

## 2012-05-16 LAB — URINE MICROSCOPIC-ADD ON

## 2012-05-16 LAB — LIPASE, BLOOD: Lipase: 26 U/L (ref 11–59)

## 2012-05-16 MED ORDER — LEVOFLOXACIN IN D5W 750 MG/150ML IV SOLN
750.0000 mg | Freq: Once | INTRAVENOUS | Status: AC
  Administered 2012-05-17: 750 mg via INTRAVENOUS
  Filled 2012-05-16: qty 150

## 2012-05-16 MED ORDER — SODIUM CHLORIDE 0.9 % IV BOLUS (SEPSIS)
1000.0000 mL | Freq: Once | INTRAVENOUS | Status: AC
  Administered 2012-05-16: 1000 mL via INTRAVENOUS

## 2012-05-16 MED ORDER — FENTANYL CITRATE 0.05 MG/ML IJ SOLN
50.0000 ug | Freq: Once | INTRAMUSCULAR | Status: AC
  Administered 2012-05-16: 50 ug via INTRAVENOUS
  Filled 2012-05-16: qty 2

## 2012-05-16 MED ORDER — ONDANSETRON HCL 4 MG/2ML IJ SOLN
4.0000 mg | Freq: Once | INTRAMUSCULAR | Status: AC
  Administered 2012-05-16: 4 mg via INTRAVENOUS
  Filled 2012-05-16: qty 2

## 2012-05-16 NOTE — ED Provider Notes (Signed)
History     CSN: [id number]  Arrival date & time 05/16/12  2127   First MD Initiated Contact with Patient 05/16/12 2155      Chief Complaint  Patient presents with  . Abdominal Pain    (Consider location/radiation/quality/duration/timing/severity/associated sxs/prior treatment) HPI Pt is approximately 3 days s/p laparoscopic cholecystectomy who was discharged home yesterday evening. She was doing well until about 8pm tonight when she started having severe sharp RUQ pain, associated with subjective fever and worse with cough and deep.   Past Medical History  Diagnosis Date  . Heart murmur   . Hyperlipidemia   . GERD (gastroesophageal reflux disease)   . Hypothyroidism   . Meningitis   . Vertigo   . Bruises easily   . Gestational hypertension 1996  . Pneumonia     "several times" (05/13/2012)  . Chronic bronchitis     "most likely q yr" (05/13/2012)  . Sleep apnea     does not wear mask  . Type II diabetes mellitus   . History of blood transfusion 2008    "related to low iron" (05/13/2012)  . Iron deficiency anemia 2008  . Migraine     "chronic" (05/13/2012)  . Grand mal seizure 1999    "related to encephalitis and meningitis" (05/13/2012)  . Encephalitis 1999    Past Surgical History  Procedure Date  . Cesarean section [id number]  . Reduction mammaplasty 2001    breast reduction  . Hernia repair 1997    umbilical  . Ovarian cyst removal 2009  . Knee arthroscopy w/ meniscal repair 1999    left knee  . Carpal tunnel release 1989    left wrist  . Wisdom tooth extraction 1980's?  . Colonoscopy   . Dilation and curettage of uterus 1997; 1998  . Cholecystectomy 05/13/2012    laparoscopic  . Tonsillectomy and adenoidectomy ~ 2000  . Abdominal hysterectomy 2008    partial  . Tubal ligation 1999  . Cholecystectomy 05/13/2012    Procedure: LAPAROSCOPIC CHOLECYSTECTOMY WITH INTRAOPERATIVE CHOLANGIOGRAM;  Surgeon: Romie Levee, MD;  Location: [facility] OR;  Service:  General;  Laterality: N/A;    Family History  Problem Relation Age of Onset  . Breast cancer Mother     History  Substance Use Topics  . Smoking status: Never Smoker   . Smokeless tobacco: Never Used  . Alcohol Use: No    OB History    Grav Para Term Preterm Abortions TAB SAB Ect Mult Living                  Review of Systems All other systems reviewed and are negative except as noted in HPI.   Allergies  Asa; Celebrex; Cinnamon flavor; Nitroglycerin; Percocet; Ultram; Pamelor; and Codeine  Home Medications   Current Outpatient Rx  Name  Route  Sig  Dispense  Refill  . AMITRIPTYLINE HCL 25 MG PO TABS   Oral   Take 50 mg by mouth at bedtime.          Mack Guise THYROID 90 MG PO TABS   Oral   Take 90 mg by mouth 2 (two) times a week. On Saturday and Sunday         . ATORVASTATIN CALCIUM 10 MG PO TABS   Oral   Take 10 mg by mouth daily.          Marland Kitchen BUTALBITAL-APAP-CAFF-COD 50-325-40-30 MG PO CAPS   Oral   Take 1 capsule by mouth every 4 (four) hours  as needed. For headache         . VITAMIN D 1000 UNITS PO TABS   Oral   Take 1,000 Units by mouth daily.         Marland Kitchen FLAX SEED OIL PO   Oral   Take 1 tablet by mouth daily.          Marland Kitchen GLIMEPIRIDE 2 MG PO TABS   Oral   Take 2 mg by mouth daily before breakfast.          . IBUPROFEN 600 MG PO TABS   Oral   Take 1 tablet (600 mg total) by mouth 3 (three) times daily as needed for pain.   30 tablet   0   . KOMBIGLYZE XR 2.10-998 MG PO TB24   Oral   Take 2 tablets by mouth daily.          Marland Kitchen LISINOPRIL 5 MG PO TABS   Oral   Take 5 mg by mouth daily.          Marland Kitchen NARATRIPTAN HCL 2.5 MG PO TABS   Oral   Take 2.5 mg by mouth daily as needed. Take one (1) tablet at onset of headache; if returns or does not resolve, may repeat after 4 hours; do not exceed five (5) mg in 24 hours.         . OMEPRAZOLE 40 MG PO CPDR   Oral   Take 40 mg by mouth daily.          . THYROID 120 MG PO TABS    Oral   Take 120 mg by mouth See admin instructions. Take 1 tablet Mon- Fri         . VERAPAMIL HCL ER 240 MG PO TBCR   Oral   Take 240 mg by mouth daily.            BP 142/84  Pulse 106  Temp 98.5 F (36.9 C) (Oral)  Resp 16  SpO2 92%  Physical Exam  Nursing note and vitals reviewed. Constitutional: She is oriented to person, place, and time. She appears well-developed and well-nourished.  HENT:  Head: Normocephalic and atraumatic.  Eyes: EOM are normal. Pupils are equal, round, and reactive to light.  Neck: Normal range of motion. Neck supple.  Cardiovascular: Normal rate, normal heart sounds and intact distal pulses.   Pulmonary/Chest: Effort normal and breath sounds normal.       Shallow breathing due to pain  Abdominal: Bowel sounds are normal. She exhibits no distension. There is tenderness (RUQ). There is guarding. There is no rebound.  Musculoskeletal: Normal range of motion. She exhibits no edema and no tenderness.  Neurological: She is alert and oriented to person, place, and time. She has normal strength. No cranial nerve deficit or sensory deficit.  Skin: Skin is warm and dry. No rash noted.  Psychiatric: She has a normal mood and affect.    ED Course  Procedures (including critical care time)  Labs Reviewed  CBC WITH DIFFERENTIAL - Abnormal; Notable for the following:    WBC 14.2 (*)     Hemoglobin 10.8 (*)     HCT 34.2 (*)     MCV 77.0 (*)     MCH 24.3 (*)     RDW 16.9 (*)     Neutro Abs 9.5 (*)     Monocytes Absolute 1.5 (*)     All other components within normal limits  COMPREHENSIVE METABOLIC PANEL  LIPASE, BLOOD  URINALYSIS, ROUTINE W REFLEX MICROSCOPIC   No results found.   No diagnosis found.    MDM  Labs and imaging pending. Care signed out to Dr. Dierdre Highman at the change of shift.         Earnest Thalman B. Bernette Mayers, MD 05/16/12 2255

## 2012-05-16 NOTE — Telephone Encounter (Signed)
Senta Kantor called and reported that her mother, Briana Kelley had been coughing a lot and tonight developed severe right side pain under her rib cage.  She has a subjective fever and is coughing up sputum.  The pain is worse with coughing and a deep breath.  I recommend that she go to the emergency room and be evaluated to make sure she does not have a pneumonia or some other infection.

## 2012-05-16 NOTE — ED Notes (Signed)
The pt is alert family  In her room.  She is c/o the room being hot.  Room temp adjusted.  Pain some better

## 2012-05-16 NOTE — ED Notes (Signed)
Patient transported to X-ray 

## 2012-05-16 NOTE — ED Notes (Signed)
Pt reports RUQ pain starting this evening, increase deep breathing and coughing, pt reports having her gall bladder removed Wednesday

## 2012-05-17 ENCOUNTER — Inpatient Hospital Stay (HOSPITAL_COMMUNITY): Payer: BC Managed Care – PPO

## 2012-05-17 ENCOUNTER — Emergency Department (HOSPITAL_COMMUNITY): Payer: BC Managed Care – PPO

## 2012-05-17 ENCOUNTER — Encounter (HOSPITAL_COMMUNITY): Payer: Self-pay | Admitting: Internal Medicine

## 2012-05-17 DIAGNOSIS — R509 Fever, unspecified: Secondary | ICD-10-CM

## 2012-05-17 DIAGNOSIS — A419 Sepsis, unspecified organism: Secondary | ICD-10-CM | POA: Diagnosis present

## 2012-05-17 DIAGNOSIS — R109 Unspecified abdominal pain: Secondary | ICD-10-CM

## 2012-05-17 DIAGNOSIS — G43909 Migraine, unspecified, not intractable, without status migrainosus: Secondary | ICD-10-CM

## 2012-05-17 DIAGNOSIS — E119 Type 2 diabetes mellitus without complications: Secondary | ICD-10-CM

## 2012-05-17 DIAGNOSIS — J189 Pneumonia, unspecified organism: Secondary | ICD-10-CM

## 2012-05-17 DIAGNOSIS — E669 Obesity, unspecified: Secondary | ICD-10-CM

## 2012-05-17 DIAGNOSIS — E785 Hyperlipidemia, unspecified: Secondary | ICD-10-CM

## 2012-05-17 DIAGNOSIS — R0602 Shortness of breath: Secondary | ICD-10-CM

## 2012-05-17 DIAGNOSIS — E039 Hypothyroidism, unspecified: Secondary | ICD-10-CM

## 2012-05-17 DIAGNOSIS — G4733 Obstructive sleep apnea (adult) (pediatric): Secondary | ICD-10-CM

## 2012-05-17 DIAGNOSIS — D72829 Elevated white blood cell count, unspecified: Secondary | ICD-10-CM

## 2012-05-17 DIAGNOSIS — E876 Hypokalemia: Secondary | ICD-10-CM

## 2012-05-17 DIAGNOSIS — N390 Urinary tract infection, site not specified: Secondary | ICD-10-CM

## 2012-05-17 LAB — BASIC METABOLIC PANEL
BUN: 4 mg/dL — ABNORMAL LOW (ref 6–23)
CO2: 23 mEq/L (ref 19–32)
Calcium: 8.5 mg/dL (ref 8.4–10.5)
Chloride: 103 mEq/L (ref 96–112)
Creatinine, Ser: 0.43 mg/dL — ABNORMAL LOW (ref 0.50–1.10)
GFR calc Af Amer: >90 mL/min (ref >90)
GFR calc non Af Amer: >90 mL/min (ref >90)
Glucose, Bld: 78 mg/dL (ref 70–99)
Potassium: 3.3 mEq/L — ABNORMAL LOW (ref 3.5–5.1)
Sodium: 139 mEq/L (ref 135–145)

## 2012-05-17 LAB — GLUCOSE, CAPILLARY
Glucose-Capillary: 89 mg/dL (ref 70–99)
Glucose-Capillary: 94 mg/dL (ref 70–99)
Glucose-Capillary: 81 mg/dL (ref 70–99)

## 2012-05-17 LAB — HEMOGLOBIN A1C
Hgb A1c MFr Bld: 7.1 % — ABNORMAL HIGH (ref <5.7)
Mean Plasma Glucose: 157 mg/dL — ABNORMAL HIGH (ref <117)

## 2012-05-17 LAB — CBC
HCT: 31.5 % — ABNORMAL LOW (ref 36.0–46.0)
Hemoglobin: 9.8 g/dL — ABNORMAL LOW (ref 12.0–15.0)
MCH: 24.3 pg — ABNORMAL LOW (ref 26.0–34.0)
MCHC: 31.1 g/dL (ref 30.0–36.0)
MCV: 78 fL (ref 78.0–100.0)
Platelets: 349 10*3/uL (ref 150–400)
RBC: 4.04 MIL/uL (ref 3.87–5.11)
RDW: 17 % — ABNORMAL HIGH (ref 11.5–15.5)
WBC: 11.6 10*3/uL — ABNORMAL HIGH (ref 4.0–10.5)

## 2012-05-17 LAB — PROTIME-INR
INR: 1.21 (ref 0.00–1.49)
Prothrombin Time: 15.1 seconds (ref 11.6–15.2)

## 2012-05-17 MED ORDER — ALUM & MAG HYDROXIDE-SIMETH 200-200-20 MG/5ML PO SUSP: 30.0000 mL | Freq: Four times a day (QID) | ORAL | Status: DC | PRN

## 2012-05-17 MED ORDER — CIPROFLOXACIN IN D5W 400 MG/200ML IV SOLN
400.0000 mg | Freq: Once | INTRAVENOUS | Status: AC
  Administered 2012-05-17: 400 mg via INTRAVENOUS
  Filled 2012-05-17: qty 200

## 2012-05-17 MED ORDER — ONDANSETRON HCL 4 MG PO TABS: 4.0000 mg | ORAL_TABLET | Freq: Four times a day (QID) | ORAL | Status: DC | PRN

## 2012-05-17 MED ORDER — ALBUTEROL SULFATE (5 MG/ML) 0.5% IN NEBU
2.5000 mg | INHALATION_SOLUTION | Freq: Four times a day (QID) | RESPIRATORY_TRACT | Status: DC
  Administered 2012-05-17 – 2012-05-18 (×3): 2.5 mg via RESPIRATORY_TRACT
  Filled 2012-05-17 (×3): qty 0.5

## 2012-05-17 MED ORDER — SODIUM CHLORIDE 0.9 % IV SOLN
INTRAVENOUS | Status: DC
  Administered 2012-05-17: 05:00:00 via INTRAVENOUS

## 2012-05-17 MED ORDER — FENTANYL CITRATE 0.05 MG/ML IJ SOLN
INTRAMUSCULAR | Status: AC | PRN
  Administered 2012-05-17: 25 ug via INTRAVENOUS
  Administered 2012-05-17: 50 ug via INTRAVENOUS
  Administered 2012-05-17: 25 ug via INTRAVENOUS

## 2012-05-17 MED ORDER — MIDAZOLAM HCL 2 MG/2ML IJ SOLN
INTRAMUSCULAR | Status: AC | PRN
  Administered 2012-05-17 (×2): 1 mg via INTRAVENOUS

## 2012-05-17 MED ORDER — FENTANYL CITRATE 0.05 MG/ML IJ SOLN
INTRAMUSCULAR | Status: AC
  Filled 2012-05-17: qty 2

## 2012-05-17 MED ORDER — ONDANSETRON HCL 4 MG/2ML IJ SOLN: 4.0000 mg | Freq: Three times a day (TID) | INTRAMUSCULAR | Status: AC | PRN

## 2012-05-17 MED ORDER — INSULIN ASPART 100 UNIT/ML ~~LOC~~ SOLN
0.0000 [IU] | SUBCUTANEOUS | Status: DC
  Administered 2012-05-18 (×2): 1 [IU] via SUBCUTANEOUS
  Administered 2012-05-19: 2 [IU] via SUBCUTANEOUS
  Administered 2012-05-20 – 2012-05-21 (×4): 1 [IU] via SUBCUTANEOUS

## 2012-05-17 MED ORDER — ENOXAPARIN SODIUM 40 MG/0.4ML ~~LOC~~ SOLN
40.0000 mg | Freq: Every day | SUBCUTANEOUS | Status: DC
  Filled 2012-05-17: qty 0.4

## 2012-05-17 MED ORDER — FENTANYL CITRATE 0.05 MG/ML IJ SOLN
50.0000 ug | INTRAMUSCULAR | Status: DC | PRN
  Administered 2012-05-17 – 2012-05-19 (×7): 50 ug via INTRAVENOUS
  Filled 2012-05-17 (×7): qty 2

## 2012-05-17 MED ORDER — CIPROFLOXACIN IN D5W 400 MG/200ML IV SOLN
400.0000 mg | Freq: Two times a day (BID) | INTRAVENOUS | Status: DC
  Administered 2012-05-17 – 2012-05-18 (×2): 400 mg via INTRAVENOUS
  Filled 2012-05-17 (×4): qty 200

## 2012-05-17 MED ORDER — IOHEXOL 300 MG/ML  SOLN
20.0000 mL | INTRAMUSCULAR | Status: AC
  Administered 2012-05-17: 20 mL via ORAL

## 2012-05-17 MED ORDER — PIPERACILLIN-TAZOBACTAM 3.375 G IVPB
3.3750 g | Freq: Three times a day (TID) | INTRAVENOUS | Status: DC
  Administered 2012-05-17 – 2012-05-19 (×6): 3.375 g via INTRAVENOUS
  Filled 2012-05-17 (×8): qty 50

## 2012-05-17 MED ORDER — PIPERACILLIN-TAZOBACTAM 3.375 G IVPB
3.3750 g | Freq: Once | INTRAVENOUS | Status: AC
  Administered 2012-05-17: 3.375 g via INTRAVENOUS
  Filled 2012-05-17: qty 50

## 2012-05-17 MED ORDER — VANCOMYCIN HCL IN DEXTROSE 1-5 GM/200ML-% IV SOLN: 1000.0000 mg | Freq: Once | INTRAVENOUS | Status: DC

## 2012-05-17 MED ORDER — ENOXAPARIN SODIUM 40 MG/0.4ML ~~LOC~~ SOLN
40.0000 mg | Freq: Every day | SUBCUTANEOUS | Status: DC
  Administered 2012-05-18 – 2012-05-20 (×3): 40 mg via SUBCUTANEOUS
  Filled 2012-05-17 (×5): qty 0.4

## 2012-05-17 MED ORDER — IOHEXOL 300 MG/ML  SOLN
100.0000 mL | Freq: Once | INTRAMUSCULAR | Status: AC | PRN
  Administered 2012-05-17: 100 mL via INTRAVENOUS

## 2012-05-17 MED ORDER — VANCOMYCIN HCL 10 G IV SOLR
2000.0000 mg | Freq: Once | INTRAVENOUS | Status: AC
  Administered 2012-05-17: 2000 mg via INTRAVENOUS
  Filled 2012-05-17: qty 2000

## 2012-05-17 MED ORDER — VANCOMYCIN HCL 1000 MG IV SOLR
750.0000 mg | Freq: Three times a day (TID) | INTRAVENOUS | Status: DC
  Administered 2012-05-17 – 2012-05-19 (×6): 750 mg via INTRAVENOUS
  Filled 2012-05-17 (×9): qty 750

## 2012-05-17 MED ORDER — ALBUTEROL SULFATE (5 MG/ML) 0.5% IN NEBU: 2.5000 mg | INHALATION_SOLUTION | RESPIRATORY_TRACT | Status: DC | PRN

## 2012-05-17 MED ORDER — ACETAMINOPHEN 650 MG RE SUPP: 650.0000 mg | Freq: Four times a day (QID) | RECTAL | Status: DC | PRN

## 2012-05-17 MED ORDER — ZOLPIDEM TARTRATE 5 MG PO TABS
5.0000 mg | ORAL_TABLET | Freq: Every evening | ORAL | Status: DC | PRN
  Administered 2012-05-20 (×2): 5 mg via ORAL
  Filled 2012-05-17 (×2): qty 1

## 2012-05-17 MED ORDER — BENZONATATE 100 MG PO CAPS
200.0000 mg | ORAL_CAPSULE | Freq: Three times a day (TID) | ORAL | Status: DC | PRN
  Administered 2012-05-19 – 2012-05-21 (×2): 200 mg via ORAL
  Filled 2012-05-17 (×4): qty 2

## 2012-05-17 MED ORDER — MIDAZOLAM HCL 2 MG/2ML IJ SOLN
INTRAMUSCULAR | Status: AC
  Filled 2012-05-17: qty 2

## 2012-05-17 MED ORDER — ACETAMINOPHEN 325 MG PO TABS
650.0000 mg | ORAL_TABLET | Freq: Four times a day (QID) | ORAL | Status: DC | PRN
  Administered 2012-05-17 – 2012-05-20 (×3): 650 mg via ORAL
  Filled 2012-05-17 (×3): qty 2

## 2012-05-17 MED ORDER — SODIUM CHLORIDE 0.9 % IV SOLN: INTRAVENOUS | Status: AC

## 2012-05-17 MED ORDER — POTASSIUM CHLORIDE 10 MEQ/100ML IV SOLN
10.0000 meq | INTRAVENOUS | Status: AC
  Administered 2012-05-17 (×2): 10 meq via INTRAVENOUS
  Filled 2012-05-17 (×2): qty 100

## 2012-05-17 MED ORDER — ONDANSETRON HCL 4 MG/2ML IJ SOLN
4.0000 mg | Freq: Four times a day (QID) | INTRAMUSCULAR | Status: DC | PRN
  Administered 2012-05-17 – 2012-05-19 (×3): 4 mg via INTRAVENOUS
  Filled 2012-05-17 (×4): qty 2

## 2012-05-17 NOTE — Discharge Summary (Signed)
Physician Discharge Summary  Patient ID: Briana Kelley MRN: [id number] DOB/AGE: 52-Sep-1962 52 y.o.  Admit date: 05/13/2012 Discharge date: 05/17/2012  Admission Diagnoses: cholecystis, chronic  Discharge Diagnoses:  Principal Problem:  *Cholecystitis   Discharged Condition: good  Hospital Course: The patient was admitted after a complicated laparoscopic cholecystectomy.  She was discharged home once she was able to tolerate a diet and her pain was controlled.  Consults: None  Significant Diagnostic Studies: labs: cbc, cmet  Treatments: IV hydration and analgesia: ibuprofen  Discharge Exam: Blood pressure 116/68, pulse 110, temperature 101.3 F (38.5 C), temperature source Oral, resp. rate 18, height 5\' 2"  (1.575 m), weight 225 lb (102.059 kg), SpO2 91.00%. General appearance: alert and cooperative GI: normal findings: soft, non-tender  Disposition: 01-Home or Self Care  Discharge Orders    Future Appointments: Provider: Department: Dept Phone: Center:   06/01/2012 10:25 AM Romie Levee, MD [facility] Surgery, [place] [phone] None       Medication List     As of 05/17/2012 10:37 AM    TAKE these medications         amitriptyline 25 MG tablet   Commonly known as: ELAVIL   Take 50 mg by mouth at bedtime.      thyroid 120 MG tablet   Commonly known as: ARMOUR   Take 120 mg by mouth See admin instructions. Take 1 tablet Mon- Fri      ARMOUR THYROID 90 MG tablet   Generic drug: thyroid   Take 90 mg by mouth 2 (two) times a week. On Saturday and Sunday      atorvastatin 10 MG tablet   Commonly known as: LIPITOR   Take 10 mg by mouth daily.      butalbital-acetaminophen-caffeine 50-325-40-30 MG per capsule   Commonly known as: FIORICET WITH CODEINE   Take 1 capsule by mouth every 4 (four) hours as needed. For headache      cholecalciferol 1000 UNITS tablet   Commonly known as: VITAMIN D   Take 1,000 Units by mouth daily.      FLAX SEED OIL PO   Take 1 tablet by mouth daily.      glimepiride 2 MG tablet   Commonly known as: AMARYL   Take 2 mg by mouth daily before breakfast.      ibuprofen 600 MG tablet   Commonly known as: ADVIL,MOTRIN   Take 1 tablet (600 mg total) by mouth 3 (three) times daily as needed for pain.      KOMBIGLYZE XR 2.10-998 MG Tb24   Generic drug: Saxagliptin-Metformin   Take 2 tablets by mouth daily.      lisinopril 5 MG tablet   Commonly known as: PRINIVIL,ZESTRIL   Take 5 mg by mouth daily.      omeprazole 40 MG capsule   Commonly known as: PRILOSEC   Take 40 mg by mouth daily.      verapamil 240 MG CR tablet   Commonly known as: CALAN-SR   Take 240 mg by mouth daily.         Signed: Shaneisha Burkel C. 05/17/2012, 10:37 AM

## 2012-05-17 NOTE — ED Notes (Signed)
Zosyn hung

## 2012-05-17 NOTE — Progress Notes (Signed)
Patient ID: Briana Kelley, female   DOB: 09-13-1960, 51 y.o.   MRN: [id number]    Subjective: This is a patient of Dr. Romie Levee who underwent a lap chole on Wednesday 05-13-12 and was found to have cholecystitis.  She did have fibular placed for additional hemostasis.  She had difficulty with pain control post-operatively and was sent home late Friday evening.  However, apparently after being at home for less than a day continued to have worsening abdominal pain.  She came back to the [facility] where she was found to have a UTI and likely a gb fossa abscess.  We have been asked to evaluate.  Objective: Vital signs in last 24 hours: Temp:  [98.5 F (36.9 C)-98.9 F (37.2 C)] 98.7 F (37.1 C) (12/15 0430) Pulse Rate:  [82-106] 106  (12/15 0430) Resp:  [16-22] 20  (12/15 0430) BP: (140-151)/(76-84) 151/76 mmHg (12/15 0430) SpO2:  [92 %-96 %] 93 % (12/15 0756) Weight:  [221 lb 9 oz (100.5 kg)] 221 lb 9 oz (100.5 kg) (12/15 0430)    Intake/Output from previous day: 12/14 0701 - 12/15 0700 In: 1000 [I.V.:1000] Out: -  Intake/Output this shift:    PE: Heart: regular but tachy Lungs: CTAB Abd: soft, exquisitely tender in RUQ, few BS, obese, does not appear distended  Lab Results:   Basename 05/16/12 2218 05/15/12 0943  WBC 14.2* 19.8*  HGB 10.8* 10.5*  HCT 34.2* 33.2*  PLT 368 349   BMET  Basename 05/16/12 2218 05/15/12 0943  NA 142 138  K 3.3* 2.9*  CL 106 101  CO2 22 25  GLUCOSE 83 161*  BUN 7 10  CREATININE 0.46* 0.47*  CALCIUM 9.3 8.9   PT/INR No results found for this basename: LABPROT:2,INR:2 in the last 72 hours CMP     Component Value Date/Time   NA 142 05/16/2012 2218   K 3.3* 05/16/2012 2218   CL 106 05/16/2012 2218   CO2 22 05/16/2012 2218   GLUCOSE 83 05/16/2012 2218   BUN 7 05/16/2012 2218   CREATININE 0.46* 05/16/2012 2218   CALCIUM 9.3 05/16/2012 2218   PROT 6.8 05/16/2012 2218   ALBUMIN 2.6* 05/16/2012 2218   AST 16 05/16/2012 2218   ALT 18  05/16/2012 2218   ALKPHOS 85 05/16/2012 2218   BILITOT 0.4 05/16/2012 2218   GFRNONAA >90 05/16/2012 2218   GFRAA >90 05/16/2012 2218   Lipase     Component Value Date/Time   LIPASE 26 05/16/2012 2218       Studies/Results: Ct Abdomen Pelvis W Contrast  05/17/2012  *RADIOLOGY REPORT*  Clinical Data: Right upper quadrant pain.  Cough.  CT ABDOMEN AND PELVIS WITH CONTRAST  Technique:  Multidetector CT imaging of the abdomen and pelvis was performed following the standard protocol during bolus administration of intravenous contrast.  Contrast: OMNIPAQUE IOHEXOL 300 MG/ML  SOLN  Comparison: 03/13/2012  Findings: Atelectasis or infiltration in both lung bases.  Diffuse low attenuation change throughout the liver consistent with fatty infiltration.  Since the previous study, there has been interval cholecystectomy.  There is gas and fluid in the gallbladder fossa with infiltration in the right upper quadrant fat.  Changes may represent postoperative collection although abscess in the gallbladder fossa is suspected.  No biliary gas or bile duct dilatation.  The pancreas, spleen, adrenal glands, kidneys, and retroperitoneal lymph nodes are unremarkable.  Calcification of the abdominal aorta without aneurysm.  The stomach, small bowel, and colon are not abnormally distended.  There is infiltration in the anterior abdominal wall in the midline suggesting postoperative change. Small ventral abdominal wall hernia at the site of surgery containing fat.  Pelvis:  The uterus appears surgically absent.  No abnormal adnexal masses.  Bladder wall is not thickened.  No free or loculated pelvic fluid collections.  The appendix is normal.  No diverticulitis.  Normal alignment of the lumbar vertebrae.  IMPRESSION: Interval resection of the gallbladder with gas, fluid, and infiltrative changes in the gallbladder fossa worrisome for abscess.  Probable postoperative hernia containing fat at the midline.  Diffuse  fatty infiltration of the liver.   Original Report Authenticated By: Burman Nieves, M.D.    Dg Abd Acute W/chest  05/16/2012  *RADIOLOGY REPORT*  Clinical Data: Right upper quadrant pain.  Cough and fever.  ACUTE ABDOMEN SERIES (ABDOMEN 2 VIEW & CHEST 1 VIEW)  Comparison: Chest 05/14/2012.  CT abdomen pelvis 03/13/2012  Findings: Shallow inspiration.  Heart size and pulmonary vascularity are normal.  There is linear atelectasis or infiltration in the left lung base and right upper lung.  This is progressing since the previous study.  Gas and stool throughout the abdomen.  No small or large bowel distension.  Surgical clips in the right upper quadrant.  No free air.  No abnormal air fluid levels.  No radiopaque stones. Visualized bones appear intact.  IMPRESSION: Shallow inspiration with increasing linear atelectasis or infiltration in the left lung base and right upper lung. Nonobstructive bowel gas pattern.   Original Report Authenticated By: Burman Nieves, M.D.     Anti-infectives: Anti-infectives     Start     Dose/Rate Route Frequency Ordered Stop   05/17/12 1800   ciprofloxacin (CIPRO) IVPB 400 mg        400 mg 200 mL/hr over 60 Minutes Intravenous 2 times daily 05/17/12 0438     05/17/12 1400   vancomycin (VANCOCIN) 750 mg in sodium chloride 0.9 % 150 mL IVPB        750 mg 150 mL/hr over 60 Minutes Intravenous Every 8 hours 05/17/12 0500     05/17/12 1200  piperacillin-tazobactam (ZOSYN) IVPB 3.375 g       3.375 g 12.5 mL/hr over 240 Minutes Intravenous Every 8 hours 05/17/12 0458     05/17/12 0530   vancomycin (VANCOCIN) 2,000 mg in sodium chloride 0.9 % 500 mL IVPB        2,000 mg 250 mL/hr over 120 Minutes Intravenous  Once 05/17/12 0458 05/17/12 0729   05/17/12 0330   vancomycin (VANCOCIN) IVPB 1000 mg/200 mL premix  Status:  Discontinued        1,000 mg 200 mL/hr over 60 Minutes Intravenous  Once 05/17/12 0327 05/17/12 0451   05/17/12 0315   ciprofloxacin (CIPRO) IVPB 400  mg        400 mg 200 mL/hr over 60 Minutes Intravenous  Once 05/17/12 0305 05/17/12 0504   05/17/12 0315  piperacillin-tazobactam (ZOSYN) IVPB 3.375 g       3.375 g 12.5 mL/hr over 240 Minutes Intravenous  Once 05/17/12 0309 05/17/12 0731   05/17/12 0000   levofloxacin (LEVAQUIN) IVPB 750 mg        750 mg 100 mL/hr over 90 Minutes Intravenous  Once 05/16/12 2350 05/17/12 0220           Assessment/Plan  1. S/p lap chole 2. Likely post op gb fossa abscess 3. UTI Patient Active Problem List  Diagnosis  . Cholecystitis  . HCAP (healthcare-associated pneumonia)  .  Fever  . Abdominal  pain, other specified site  . Hypokalemia  . Leukocytosis  . SOB (shortness of breath)  . Diabetes mellitus  . Hyperlipidemia  . Hypothyroid  . Obesity  . Migraines  . Obstructive sleep apnea (adult) (pediatric)  . UTI (lower urinary tract infection)   Plan: 1. Will make NPO.  I have spoken with the patient about her CT scan findings.  We will ask IR to place a perc drain in the gb fossa abscess for drainage. 2. Hold DVT prophylaxis today 3. Can have clears after the procedure 4. Cont abx therapy   LOS: 1 day    Jourdon Zimmerle E 05/17/2012, 8:01 AM Pager: [phone]

## 2012-05-17 NOTE — ED Notes (Signed)
Report called to the nurse on 4n

## 2012-05-17 NOTE — Procedures (Signed)
Interventional Radiology Procedure Note  Procedure: CT guided placement of a 21F drain in gallbladder fossa fluid collection.  90 mL purulent red/brown fluid aspirated and sent for culture. Complications: None Recommendations: - Maintain drain to bulb suction until acute symptoms resolve and output decreased, then convert to gravity bag - Consider repeat CT prior to tube removal  Signed,  Sterling Big, MD Vascular & Interventional Radiologist [facility] Radiology

## 2012-05-17 NOTE — ED Notes (Signed)
1 liter of saline has infused.  nss hung [employer]

## 2012-05-17 NOTE — H&P (Signed)
[facility] History and Physical  Briana Kelley:[id number] DOB: [birth year] DOA: 05/16/2012  Referring physician: EDP PCP: Cala Bradford, MD  Specialists:   Chief Complaint: SOB, Cough Fevers  HPI: Briana Kelley is a 51 y.o. female who presents to the ED with complaints of 3 days of fevers Cough and worsening SOB over the past 24 hours.   She also reports having Nausea and Vomiting x 1 and not being able to hold down much food following her surgery 4 days ago.   She had a Laparoscopic Cholecystectomy performed on 12/11.      Review of Systems: The patient denies weight loss, vision loss, decreased hearing, hoarseness, chest pain, syncope, dyspnea on exertion, peripheral edema, balance deficits, hemoptysis, melena, hematochezia, severe indigestion/heartburn, hematuria, incontinence, dysuria, muscle weakness, suspicious skin lesions, transient blindness, difficulty walking, depression, unusual weight change, abnormal bleeding, enlarged lymph nodes, angioedema, and breast masses.    Past Medical History  Diagnosis Date  . Heart murmur   . Hyperlipidemia   . GERD (gastroesophageal reflux disease)   . Hypothyroidism   . Meningitis   . Vertigo   . Bruises easily   . Gestational hypertension 1996  . Pneumonia     "several times" (05/13/2012)  . Chronic bronchitis     "most likely q yr" (05/13/2012)  . Sleep apnea     does not wear mask  . Type II diabetes mellitus   . History of blood transfusion 2008    "related to low iron" (05/13/2012)  . Iron deficiency anemia 2008  . Migraine     "chronic" (05/13/2012)  . Grand mal seizure 1999    "related to encephalitis and meningitis" (05/13/2012)  . Encephalitis 1999   Past Surgical History  Procedure Date  . Cesarean section [id number]  . Reduction mammaplasty 2001    breast reduction  . Hernia repair 1997    umbilical  . Ovarian cyst removal 2009  . Knee arthroscopy w/ meniscal repair 1999    left knee  . Carpal  tunnel release 1989    left wrist  . Wisdom tooth extraction 1980's?  . Colonoscopy   . Dilation and curettage of uterus 1997; 1998  . Cholecystectomy 05/13/2012    laparoscopic  . Tonsillectomy and adenoidectomy ~ 2000  . Abdominal hysterectomy 2008    partial  . Tubal ligation 1999  . Cholecystectomy 05/13/2012    Procedure: LAPAROSCOPIC CHOLECYSTECTOMY WITH INTRAOPERATIVE CHOLANGIOGRAM;  Surgeon: Romie Levee, MD;  Location: [facility] OR;  Service: General;  Laterality: N/A;     Medications:  HOME MEDS: Prior to Admission medications   Medication Sig Start Date End Date Taking? Authorizing Provider  amitriptyline (ELAVIL) 25 MG tablet Take 50 mg by mouth at bedtime.  04/01/12  Yes Historical Provider, MD  ARMOUR THYROID 90 MG tablet Take 90 mg by mouth 2 (two) times a week. On Saturday and Sunday 03/30/12  Yes Historical Provider, MD  atorvastatin (LIPITOR) 10 MG tablet Take 10 mg by mouth daily.  03/20/12  Yes Historical Provider, MD  butalbital-acetaminophen-caffeine (FIORICET WITH CODEINE) 50-325-40-30 MG per capsule Take 1 capsule by mouth every 4 (four) hours as needed. For headache   Yes Historical Provider, MD  cholecalciferol (VITAMIN D) 1000 UNITS tablet Take 1,000 Units by mouth daily.   Yes Historical Provider, MD  Flaxseed, Linseed, (FLAX SEED OIL PO) Take 1 tablet by mouth daily.    Yes Historical Provider, MD  glimepiride (AMARYL) 2 MG tablet Take 2 mg by mouth daily  before breakfast.  03/20/12  Yes Historical Provider, MD  ibuprofen (ADVIL,MOTRIN) 600 MG tablet Take 1 tablet (600 mg total) by mouth 3 (three) times daily as needed for pain. 05/15/12  Yes Romie Levee, MD  KOMBIGLYZE XR 2.10-998 MG TB24 Take 2 tablets by mouth daily.  03/23/12  Yes Historical Provider, MD  lisinopril (PRINIVIL,ZESTRIL) 5 MG tablet Take 5 mg by mouth daily.  02/05/12  Yes Historical Provider, MD  naratriptan (AMERGE) 2.5 MG tablet Take 2.5 mg by mouth daily as needed. Take one (1) tablet at  onset of headache; if returns or does not resolve, may repeat after 4 hours; do not exceed five (5) mg in 24 hours.   Yes Historical Provider, MD  omeprazole (PRILOSEC) 40 MG capsule Take 40 mg by mouth daily.  03/02/12  Yes Historical Provider, MD  thyroid (ARMOUR) 120 MG tablet Take 120 mg by mouth See admin instructions. Take 1 tablet Mon- Fri   Yes Historical Provider, MD  verapamil (CALAN-SR) 240 MG CR tablet Take 240 mg by mouth daily.  04/01/12  Yes Historical Provider, MD    Allergies:  Allergies  Allergen Reactions  . Asa (Aspirin) Shortness Of Breath and Other (See Comments)    "cold sweats and migraines" (05/13/2012)  . Celebrex (Celecoxib) Shortness Of Breath  . Cinnamon Flavor Anaphylaxis    sneezing  . Nitroglycerin Nausea And Vomiting    Chills and migraine; patient stayed in hospital one week after given Nitroglycerin  . Percocet (Oxycodone-Acetaminophen) Other (See Comments)    Halucinations; "and blood in my urine" (05/13/2012)  . Ultram (Tramadol) Shortness Of Breath  . Pamelor (Nortriptyline Hcl) Other (See Comments)    Jaw pain   . Codeine Nausea Only    Gives her migraines, makes sick    Social History:   reports that she has never smoked. She has never used smokeless tobacco. She reports that she does not drink alcohol or use illicit drugs.  Family History: Family History  Problem Relation Age of Onset  . Breast cancer Mother   . Breast cancer Maternal Aunt   . Lung cancer Maternal Aunt   . CAD Maternal Grandmother   . Diabetes Father   . Diabetes Paternal Aunt      Physical Exam:  GEN:  Pleasant 51 year old Obese Caucasian Female examined  and in no acute distress; cooperative with exam Filed Vitals:   05/16/12 2134 05/17/12 0248 05/17/12 0356 05/17/12 0430  BP:  140/80 146/83 151/76  Pulse:  82 100 106  Temp: 98.5 F (36.9 C) 98.9 F (37.2 C) 98.6 F (37 C) 98.7 F (37.1 C)  TempSrc: Oral  Oral Oral  Resp: 16 22 16 20   Height:    5\' 2"   (1.575 m)  Weight:    100.5 kg (221 lb 9 oz)  SpO2: 92% 96%  95%   Blood pressure 151/76, pulse 106, temperature 98.7 F (37.1 C), temperature source Oral, resp. rate 20, height 5\' 2"  (1.575 m), weight 100.5 kg (221 lb 9 oz), SpO2 95.00%. PSYCH: She is alert and oriented x4; does not appear anxious does not appear depressed; affect is normal HEENT: Normocephalic and Atraumatic, Mucous membranes pink; PERRLA; EOM intact; Fundi:  Benign;  No scleral icterus, Nares: Patent, Oropharynx: Clear, Fair Dentition, Neck:  FROM, no cervical lymphadenopathy nor thyromegaly or carotid bruit; no JVD; Breasts:: Not examined CHEST WALL: No tenderness CHEST: Normal respiration, clear to auscultation bilaterally HEART: Regular rate and rhythm; no murmurs rubs or gallops  BACK: No kyphosis or scoliosis; no CVA tenderness ABDOMEN: Positive Bowel Sounds, Laparoscopic Surgical Wounds covered with Steri-Strips on ABD X 4 No signs of Infection, Obese, soft non-tender; no masses, no organomegaly, no pannus; no intertriginous candida. Rectal Exam: Not done EXTREMITIES: No bone or joint deformity; age-appropriate arthropathy of the hands and knees; no cyanosis, clubbing or edema; no ulcerations. Genitalia: not examined PULSES: 2+ and symmetric SKIN: Normal hydration no rash or ulceration CNS: Cranial nerves 2-12 grossly intact no focal neurologic deficit    Labs on Admission:  Basic Metabolic Panel:  Lab 05/16/12 1610 05/15/12 0943  NA 142 138  K 3.3* 2.9*  CL 106 101  CO2 22 25  GLUCOSE 83 161*  BUN 7 10  CREATININE 0.46* 0.47*  CALCIUM 9.3 8.9  MG -- --  PHOS -- --   Liver Function Tests:  Lab 05/16/12 2218 05/15/12 0943  AST 16 23  ALT 18 27  ALKPHOS 85 84  BILITOT 0.4 0.6  PROT 6.8 6.6  ALBUMIN 2.6* 2.8*    Lab 05/16/12 2218  LIPASE 26  AMYLASE --   No results found for this basename: AMMONIA:5 in the last 168 hours CBC:  Lab 05/16/12 2218 05/15/12 0943 05/14/12 0525  WBC 14.2* 19.8*  22.6*  NEUTROABS 9.5* -- --  HGB 10.8* 10.5* 11.1*  HCT 34.2* 33.2* 34.5*  MCV 77.0* 77.0* 77.2*  PLT 368 349 406*   Cardiac Enzymes: No results found for this basename: CKTOTAL:5,CKMB:5,CKMBINDEX:5,TROPONINI:5 in the last 168 hours  BNP (last 3 results) No results found for this basename: PROBNP:3 in the last 8760 hours CBG:  Lab 05/15/12 1728 05/15/12 1155 05/15/12 0825 05/14/12 2240 05/14/12 1737  GLUCAP 115* 120* 129* 143* 136*    Radiological Exams on Admission: Ct Abdomen Pelvis W Contrast  05/17/2012  *RADIOLOGY REPORT*  Clinical Data: Right upper quadrant pain.  Cough.  CT ABDOMEN AND PELVIS WITH CONTRAST  Technique:  Multidetector CT imaging of the abdomen and pelvis was performed following the standard protocol during bolus administration of intravenous contrast.  Contrast: OMNIPAQUE IOHEXOL 300 MG/ML  SOLN  Comparison: 03/13/2012  Findings: Atelectasis or infiltration in both lung bases.  Diffuse low attenuation change throughout the liver consistent with fatty infiltration.  Since the previous study, there has been interval cholecystectomy.  There is gas and fluid in the gallbladder fossa with infiltration in the right upper quadrant fat.  Changes may represent postoperative collection although abscess in the gallbladder fossa is suspected.  No biliary gas or bile duct dilatation.  The pancreas, spleen, adrenal glands, kidneys, and retroperitoneal lymph nodes are unremarkable.  Calcification of the abdominal aorta without aneurysm.  The stomach, small bowel, and colon are not abnormally distended.  There is infiltration in the anterior abdominal wall in the midline suggesting postoperative change. Small ventral abdominal wall hernia at the site of surgery containing fat.  Pelvis:  The uterus appears surgically absent.  No abnormal adnexal masses.  Bladder wall is not thickened.  No free or loculated pelvic fluid collections.  The appendix is normal.  No diverticulitis.  Normal  alignment of the lumbar vertebrae.  IMPRESSION: Interval resection of the gallbladder with gas, fluid, and infiltrative changes in the gallbladder fossa worrisome for abscess.  Probable postoperative hernia containing fat at the midline.  Diffuse fatty infiltration of the liver.   Original Report Authenticated By: Burman Nieves, M.D.    Dg Abd Acute W/chest  05/16/2012  *RADIOLOGY REPORT*  Clinical Data: Right upper quadrant  pain.  Cough and fever.  ACUTE ABDOMEN SERIES (ABDOMEN 2 VIEW & CHEST 1 VIEW)  Comparison: Chest 05/14/2012.  CT abdomen pelvis 03/13/2012  Findings: Shallow inspiration.  Heart size and pulmonary vascularity are normal.  There is linear atelectasis or infiltration in the left lung base and right upper lung.  This is progressing since the previous study.  Gas and stool throughout the abdomen.  No small or large bowel distension.  Surgical clips in the right upper quadrant.  No free air.  No abnormal air fluid levels.  No radiopaque stones. Visualized bones appear intact.  IMPRESSION: Shallow inspiration with increasing linear atelectasis or infiltration in the left lung base and right upper lung. Nonobstructive bowel gas pattern.   Original Report Authenticated By: Burman Nieves, M.D.      Assessment: Principal Problem:  *HCAP (healthcare-associated pneumonia) Active Problems:  Fever  Abdominal  pain, other specified site  Hypokalemia  Leukocytosis  SOB (shortness of breath)  Diabetes mellitus  Hyperlipidemia  Hypothyroid  Obesity  Migraines  Obstructive sleep apnea (adult) (pediatric)  UTI (lower urinary tract infection)     Plan:         Admit to Med/Surg Bed IV Vancomycin, Zosyn, and Cipro Albuterol Nebs, Tessalon Perles PRN Monitor O2 Sats IVFs Clear Liquid Diet SSI coverage Pain Control with IV Fentanyl (due to her Multiple Allergies) Reconcile Home Medications Dr. Lindie Spruce consulted due to ABD Pain and Recent Surgery DVT Prophylaxis     Code  Status:  FULL CODE Family Communication:  Family at Bedside Disposition Plan:  Return to Home  Time spent:  84 Minutes  Ron Parker [facility] Pager [phone]  If 7PM-7AM, please contact night-coverage [email or web address] Password [facility] 05/17/2012, 4:39 AM

## 2012-05-17 NOTE — ED Notes (Signed)
Dr Lovell Sheehan in with the pt

## 2012-05-17 NOTE — Progress Notes (Signed)
ANTIBIOTIC CONSULT NOTE - INITIAL  Pharmacy Consult for vancomycin Zosyn  Indication: rule out pneumonia  Allergies  Allergen Reactions  . Asa (Aspirin) Shortness Of Breath and Other (See Comments)    "cold sweats and migraines" (05/13/2012)  . Celebrex (Celecoxib) Shortness Of Breath  . Cinnamon Flavor Anaphylaxis    sneezing  . Nitroglycerin Nausea And Vomiting    Chills and migraine; patient stayed in hospital one week after given Nitroglycerin  . Percocet (Oxycodone-Acetaminophen) Other (See Comments)    Halucinations; "and blood in my urine" (05/13/2012)  . Ultram (Tramadol) Shortness Of Breath  . Pamelor (Nortriptyline Hcl) Other (See Comments)    Jaw pain   . Codeine Nausea Only    Gives her migraines, makes sick    Patient Measurements: Height: 5\' 2"  (157.5 cm) Weight: 221 lb 9 oz (100.5 kg) IBW/kg (Calculated) : 50.1   Vital Signs: Temp: 98.7 F (37.1 C) (12/15 0430) Temp src: Oral (12/15 0430) BP: 151/76 mmHg (12/15 0430) Pulse Rate: 106  (12/15 0430) Intake/Output from previous day: 12/14 0701 - 12/15 0700 In: 1000 [I.V.:1000] Out: -  Intake/Output from this shift: Total I/O In: 1000 [I.V.:1000] Out: -   Labs:  Basename 05/16/12 2218 05/15/12 0943 05/14/12 0525  WBC 14.2* 19.8* 22.6*  HGB 10.8* 10.5* 11.1*  PLT 368 349 406*  LABCREA -- -- --  CREATININE 0.46* 0.47* --   Estimated Creatinine Clearance: 91.3 ml/min (by C-G formula based on Cr of 0.46). No results found for this basename: VANCOTROUGH:2,VANCOPEAK:2,VANCORANDOM:2,GENTTROUGH:2,GENTPEAK:2,GENTRANDOM:2,TOBRATROUGH:2,TOBRAPEAK:2,TOBRARND:2,AMIKACINPEAK:2,AMIKACINTROU:2,AMIKACIN:2, in the last 72 hours   Microbiology: Recent Results (from the past 720 hour(s))  SURGICAL PCR SCREEN     Status: Normal   Collection Time   05/04/12  2:44 PM      Component Value Range Status Comment   MRSA, PCR NEGATIVE  NEGATIVE Final    Staphylococcus aureus NEGATIVE  NEGATIVE Final     Medical  History: Past Medical History  Diagnosis Date  . Heart murmur   . Hyperlipidemia   . GERD (gastroesophageal reflux disease)   . Hypothyroidism   . Meningitis   . Vertigo   . Bruises easily   . Gestational hypertension 1996  . Pneumonia     "several times" (05/13/2012)  . Chronic bronchitis     "most likely q yr" (05/13/2012)  . Sleep apnea     does not wear mask  . Type II diabetes mellitus   . History of blood transfusion 2008    "related to low iron" (05/13/2012)  . Iron deficiency anemia 2008  . Migraine     "chronic" (05/13/2012)  . Grand mal seizure 1999    "related to encephalitis and meningitis" (05/13/2012)  . Encephalitis 1999    Medications:  Scheduled:    . sodium chloride   Intravenous STAT  . albuterol  2.5 mg Nebulization Q6H  . ciprofloxacin  400 mg Intravenous Once  . ciprofloxacin  400 mg Intravenous BID  . enoxaparin (LOVENOX) injection  40 mg Subcutaneous Daily  . [COMPLETED] fentaNYL  50 mcg Intravenous Once  . [EXPIRED] iohexol  20 mL Oral Q1 Hr x 2  . [COMPLETED] levofloxacin (LEVAQUIN) IV  750 mg Intravenous Once  . [COMPLETED] ondansetron  4 mg Intravenous Once  . piperacillin-tazobactam (ZOSYN)  IV  3.375 g Intravenous Once  . [COMPLETED] sodium chloride  1,000 mL Intravenous Once  . vancomycin  1,000 mg Intravenous Once   Assessment: 51 yo female admitted with possible pneumonia. Pharmacy consulted to manage vancomycin and  Zosyn. Patient is also to receive ciprofloxacin.   Goal of Therapy:  Vancomycin trough 15-20 mcg/mL   Plan:  1. Zosyn 3.375gm IV Q8H (4 hr infusion). 2. Vancomycin 2gm IV x 1, then 750gm IV Q8H.   Emeline Gins 05/17/2012,4:46 AM

## 2012-05-17 NOTE — ED Notes (Signed)
Pain med given.  Pt on the phone

## 2012-05-17 NOTE — H&P (Signed)
Briana Kelley is an 51 y.o. female.   Chief Complaint: GB fossa collection. HPI: Patient of Dr. Romie Levee who underwent a lap chole on Wednesday 05-13-12 and was found to have cholecystitis.  She did have fibular placed for additional hemostasis.  She had difficulty with pain control post-operatively and was sent home late Friday evening.  However, apparently after being at home for less than a day continued to have worsening abdominal pain.  She came back to the [facility] where she was found to have a UTI and likely a gb fossa abscess.     Past Medical History  Diagnosis Date  . Heart murmur   . Hyperlipidemia   . GERD (gastroesophageal reflux disease)   . Hypothyroidism   . Meningitis   . Vertigo   . Bruises easily   . Gestational hypertension 1996  . Pneumonia     "several times" (05/13/2012)  . Chronic bronchitis     "most likely q yr" (05/13/2012)  . Sleep apnea     does not wear mask  . Type II diabetes mellitus   . History of blood transfusion 2008    "related to low iron" (05/13/2012)  . Iron deficiency anemia 2008  . Migraine     "chronic" (05/13/2012)  . Grand mal seizure 1999    "related to encephalitis and meningitis" (05/13/2012)  . Encephalitis 1999    Past Surgical History  Procedure Date  . Cesarean section [id number]  . Reduction mammaplasty 2001    breast reduction  . Hernia repair 1997    umbilical  . Ovarian cyst removal 2009  . Knee arthroscopy w/ meniscal repair 1999    left knee  . Carpal tunnel release 1989    left wrist  . Wisdom tooth extraction 1980's?  . Colonoscopy   . Dilation and curettage of uterus 1997; 1998  . Cholecystectomy 05/13/2012    laparoscopic  . Tonsillectomy and adenoidectomy ~ 2000  . Abdominal hysterectomy 2008    partial  . Tubal ligation 1999  . Cholecystectomy 05/13/2012    Procedure: LAPAROSCOPIC CHOLECYSTECTOMY WITH INTRAOPERATIVE CHOLANGIOGRAM;  Surgeon: Romie Levee, MD;  Location: [facility] OR;  Service: General;   Laterality: N/A;    Family History  Problem Relation Age of Onset  . Breast cancer Mother   . Breast cancer Maternal Aunt   . Lung cancer Maternal Aunt   . CAD Maternal Grandmother   . Diabetes Father   . Diabetes Paternal Aunt    Social History:  reports that she has never smoked. She has never used smokeless tobacco. She reports that she does not drink alcohol or use illicit drugs.  Allergies:  Allergies  Allergen Reactions  . Asa (Aspirin) Shortness Of Breath and Other (See Comments)    "cold sweats and migraines" (05/13/2012)  . Celebrex (Celecoxib) Shortness Of Breath  . Cinnamon Flavor Anaphylaxis    sneezing  . Nitroglycerin Nausea And Vomiting    Chills and migraine; patient stayed in hospital one week after given Nitroglycerin  . Percocet (Oxycodone-Acetaminophen) Other (See Comments)    Halucinations; "and blood in my urine" (05/13/2012)  . Ultram (Tramadol) Shortness Of Breath  . Pamelor (Nortriptyline Hcl) Other (See Comments)    Jaw pain   . Codeine Nausea Only    Gives her migraines, makes sick    Medications Prior to Admission  Medication Sig Dispense Refill  . amitriptyline (ELAVIL) 25 MG tablet Take 50 mg by mouth at bedtime.       Marland Kitchen  ARMOUR THYROID 90 MG tablet Take 90 mg by mouth 2 (two) times a week. On Saturday and Sunday      . atorvastatin (LIPITOR) 10 MG tablet Take 10 mg by mouth daily.       . butalbital-acetaminophen-caffeine (FIORICET WITH CODEINE) 50-325-40-30 MG per capsule Take 1 capsule by mouth every 4 (four) hours as needed. For headache      . cholecalciferol (VITAMIN D) 1000 UNITS tablet Take 1,000 Units by mouth daily.      . Flaxseed, Linseed, (FLAX SEED OIL PO) Take 1 tablet by mouth daily.       Marland Kitchen glimepiride (AMARYL) 2 MG tablet Take 2 mg by mouth daily before breakfast.       . ibuprofen (ADVIL,MOTRIN) 600 MG tablet Take 1 tablet (600 mg total) by mouth 3 (three) times daily as needed for pain.  30 tablet  0  . KOMBIGLYZE XR  2.10-998 MG TB24 Take 2 tablets by mouth daily.       Marland Kitchen lisinopril (PRINIVIL,ZESTRIL) 5 MG tablet Take 5 mg by mouth daily.       . naratriptan (AMERGE) 2.5 MG tablet Take 2.5 mg by mouth daily as needed. Take one (1) tablet at onset of headache; if returns or does not resolve, may repeat after 4 hours; do not exceed five (5) mg in 24 hours.      Marland Kitchen omeprazole (PRILOSEC) 40 MG capsule Take 40 mg by mouth daily.       Marland Kitchen thyroid (ARMOUR) 120 MG tablet Take 120 mg by mouth See admin instructions. Take 1 tablet Mon- Fri      . verapamil (CALAN-SR) 240 MG CR tablet Take 240 mg by mouth daily.         Results for orders placed during the hospital encounter of 05/16/12 (from the past 48 hour(s))  CBC WITH DIFFERENTIAL     Status: Abnormal   Collection Time   05/16/12 10:18 PM      Component Value Range Comment   WBC 14.2 (*) 4.0 - 10.5 K/uL    RBC 4.44  3.87 - 5.11 MIL/uL    Hemoglobin 10.8 (*) 12.0 - 15.0 g/dL    HCT 45.4 (*) 09.8 - 46.0 %    MCV 77.0 (*) 78.0 - 100.0 fL    MCH 24.3 (*) 26.0 - 34.0 pg    MCHC 31.6  30.0 - 36.0 g/dL    RDW 11.9 (*) 14.7 - 15.5 %    Platelets 368  150 - 400 K/uL    Neutrophils Relative 67  43 - 77 %    Neutro Abs 9.5 (*) 1.7 - 7.7 K/uL    Lymphocytes Relative 20  12 - 46 %    Lymphs Abs 2.8  0.7 - 4.0 K/uL    Monocytes Relative 11  3 - 12 %    Monocytes Absolute 1.5 (*) 0.1 - 1.0 K/uL    Eosinophils Relative 2  0 - 5 %    Eosinophils Absolute 0.3  0.0 - 0.7 K/uL    Basophils Relative 0  0 - 1 %    Basophils Absolute 0.1  0.0 - 0.1 K/uL   COMPREHENSIVE METABOLIC PANEL     Status: Abnormal   Collection Time   05/16/12 10:18 PM      Component Value Range Comment   Sodium 142  135 - 145 mEq/L    Potassium 3.3 (*) 3.5 - 5.1 mEq/L    Chloride 106  96 - 112  mEq/L    CO2 22  19 - 32 mEq/L    Glucose, Bld 83  70 - 99 mg/dL    BUN 7  6 - 23 mg/dL    Creatinine, Ser 1.61 (*) 0.50 - 1.10 mg/dL    Calcium 9.3  8.4 - 09.6 mg/dL    Total Protein 6.8  6.0 - 8.3  g/dL    Albumin 2.6 (*) 3.5 - 5.2 g/dL    AST 16  0 - 37 U/L    ALT 18  0 - 35 U/L    Alkaline Phosphatase 85  39 - 117 U/L    Total Bilirubin 0.4  0.3 - 1.2 mg/dL    GFR calc non Af Amer >90  >90 mL/min    GFR calc Af Amer >90  >90 mL/min   LIPASE, BLOOD     Status: Normal   Collection Time   05/16/12 10:18 PM      Component Value Range Comment   Lipase 26  11 - 59 U/L   URINALYSIS, ROUTINE W REFLEX MICROSCOPIC     Status: Abnormal   Collection Time   05/16/12 10:45 PM      Component Value Range Comment   Color, Urine AMBER (*) YELLOW BIOCHEMICALS MAY BE AFFECTED BY COLOR   APPearance CLOUDY (*) CLEAR    Specific Gravity, Urine 1.031 (*) 1.005 - 1.030    pH 6.0  5.0 - 8.0    Glucose, UA NEGATIVE  NEGATIVE mg/dL    Hgb urine dipstick NEGATIVE  NEGATIVE    Bilirubin Urine SMALL (*) NEGATIVE    Ketones, ur >80 (*) NEGATIVE mg/dL    Protein, ur 045 (*) NEGATIVE mg/dL    Urobilinogen, UA 1.0  0.0 - 1.0 mg/dL    Nitrite POSITIVE (*) NEGATIVE    Leukocytes, UA MODERATE (*) NEGATIVE   URINE MICROSCOPIC-ADD ON     Status: Abnormal   Collection Time   05/16/12 10:45 PM      Component Value Range Comment   Squamous Epithelial / LPF FEW (*) RARE    WBC, UA 21-50  <3 WBC/hpf    RBC / HPF 0-2  <3 RBC/hpf    Bacteria, UA MANY (*) RARE   GLUCOSE, CAPILLARY     Status: Normal   Collection Time   05/17/12  5:08 AM      Component Value Range Comment   Glucose-Capillary 89  70 - 99 mg/dL   GLUCOSE, CAPILLARY     Status: Normal   Collection Time   05/17/12  8:11 AM      Component Value Range Comment   Glucose-Capillary 94  70 - 99 mg/dL   CBC     Status: Abnormal   Collection Time   05/17/12  9:14 AM      Component Value Range Comment   WBC 11.6 (*) 4.0 - 10.5 K/uL    RBC 4.04  3.87 - 5.11 MIL/uL    Hemoglobin 9.8 (*) 12.0 - 15.0 g/dL    HCT 40.9 (*) 81.1 - 46.0 %    MCV 78.0  78.0 - 100.0 fL    MCH 24.3 (*) 26.0 - 34.0 pg    MCHC 31.1  30.0 - 36.0 g/dL    RDW 91.4 (*) 78.2 -  15.5 %    Platelets 349  150 - 400 K/uL    Ct Abdomen Pelvis W Contrast  05/17/2012  *RADIOLOGY REPORT*  Clinical Data: Right upper quadrant pain.  Cough.  CT  ABDOMEN AND PELVIS WITH CONTRAST  Technique:  Multidetector CT imaging of the abdomen and pelvis was performed following the standard protocol during bolus administration of intravenous contrast.  Contrast: OMNIPAQUE IOHEXOL 300 MG/ML  SOLN  Comparison: 03/13/2012  Findings: Atelectasis or infiltration in both lung bases.  Diffuse low attenuation change throughout the liver consistent with fatty infiltration.  Since the previous study, there has been interval cholecystectomy.  There is gas and fluid in the gallbladder fossa with infiltration in the right upper quadrant fat.  Changes may represent postoperative collection although abscess in the gallbladder fossa is suspected.  No biliary gas or bile duct dilatation.  The pancreas, spleen, adrenal glands, kidneys, and retroperitoneal lymph nodes are unremarkable.  Calcification of the abdominal aorta without aneurysm.  The stomach, small bowel, and colon are not abnormally distended.  There is infiltration in the anterior abdominal wall in the midline suggesting postoperative change. Small ventral abdominal wall hernia at the site of surgery containing fat.  Pelvis:  The uterus appears surgically absent.  No abnormal adnexal masses.  Bladder wall is not thickened.  No free or loculated pelvic fluid collections.  The appendix is normal.  No diverticulitis.  Normal alignment of the lumbar vertebrae.  IMPRESSION: Interval resection of the gallbladder with gas, fluid, and infiltrative changes in the gallbladder fossa worrisome for abscess.  Probable postoperative hernia containing fat at the midline.  Diffuse fatty infiltration of the liver.   Original Report Authenticated By: Burman Nieves, M.D.    Dg Abd Acute W/chest  05/16/2012  *RADIOLOGY REPORT*  Clinical Data: Right upper quadrant pain.   Cough and fever.  ACUTE ABDOMEN SERIES (ABDOMEN 2 VIEW & CHEST 1 VIEW)  Comparison: Chest 05/14/2012.  CT abdomen pelvis 03/13/2012  Findings: Shallow inspiration.  Heart size and pulmonary vascularity are normal.  There is linear atelectasis or infiltration in the left lung base and right upper lung.  This is progressing since the previous study.  Gas and stool throughout the abdomen.  No small or large bowel distension.  Surgical clips in the right upper quadrant.  No free air.  No abnormal air fluid levels.  No radiopaque stones. Visualized bones appear intact.  IMPRESSION: Shallow inspiration with increasing linear atelectasis or infiltration in the left lung base and right upper lung. Nonobstructive bowel gas pattern.   Original Report Authenticated By: Burman Nieves, M.D.     Review of Systems  Constitutional: Positive for fever and malaise/fatigue.  Respiratory: Negative for cough and sputum production.   Cardiovascular: Negative for chest pain.  Gastrointestinal: Positive for nausea, vomiting and abdominal pain.  Genitourinary: Positive for dysuria.    Blood pressure 151/76, pulse 106, temperature 98.7 F (37.1 C), temperature source Oral, resp. rate 20, height 5\' 2"  (1.575 m), weight 221 lb 9 oz (100.5 kg), SpO2 93.00%. Physical Exam  Constitutional: She is oriented to person, place, and time. She appears well-developed and well-nourished. No distress.  Cardiovascular: Normal rate.  Exam reveals no gallop and no friction rub.   No murmur heard.      Tachy   Respiratory: Effort normal and breath sounds normal. No respiratory distress.  GI: Soft. She exhibits no distension. There is tenderness.       Few BS   Neurological: She is alert and oriented to person, place, and time.  Skin: Skin is warm and dry.  Psychiatric: She has a normal mood and affect. Her behavior is normal. Judgment and thought content normal.  Assessment/Plan Collection in GB fossa amendable to percutaneous  drainage with narrow window requiring possible transhepatic access to fossa. Procedure details, reasons for drain and risks discussed with patient with her apparent understanding. Written consent obtained. Plan for drainage procedure under CT guidance later this morning.   CAMPBELL,PAMELA D 05/17/2012, 9:59 AM

## 2012-05-17 NOTE — ED Notes (Signed)
The [pt returned from c-t.  Alert pts family at the bedside

## 2012-05-17 NOTE — Progress Notes (Signed)
I have interviewed and examined this patient. I agree with plan outlined by K.Earl Gala, PA  For IR drainage GB fossa abscess today.  Angelia Mould. Derrell Lolling, M.D., [facility] General and Minimally invasive Surgery Breast and Colorectal Surgery Office:   [phone] Pager:   [phone] \

## 2012-05-17 NOTE — ED Notes (Signed)
levaguinn hung

## 2012-05-17 NOTE — H&P (Signed)
Agree with PA note.    Signed,  Nickalaus Crooke K. Briton Sellman, MD Vascular & Interventional Radiologist [facility]  

## 2012-05-17 NOTE — Progress Notes (Signed)
[facility] PROGRESS NOTE  Briana Kelley WJX:[id number] DOB: January 24, 1961 DOA: 05/16/2012 PCP: Cala Bradford, MD  Assessment/Plan: Sepsis -Secondary to GB fossa abscess vs HCAP -Blood cultures x2 sets -Continue vancomycin, Zosyn, Cipro pending culture data - Appreciate surgery followup--as noted for IR drainage Pyuria -Followup urine culture Fever/leukocytosis -Fevers subjective at home -WBC actually trending down from the time of discharge on 05/15/2012 -Continue empiric antibiotics, follow cultures Tachycardia -Likely physiologic the acute infectious process -Check EKG -Continue IV fluids Hypokalemia -Replete -Check magnesium Diabetes mellitus type 2 -Check hemoglobin A1c -Continue NovoLog sliding scale Leg edema -Patient complains of pain -Venous Doppler to rule out DVT in light of recent surgery      Family Communication:   Pt at beside Disposition Plan:   Home when medically stable Total time 60 min, >50% spent counseling and coordinating care    Antibiotics:  Ciprofloxacin December 15>>>  Vancomycin December 15>>>  Zosyn December 15>>>    Procedures/Studies: Dg Chest 2 View  05/04/2012  *RADIOLOGY REPORT*  Clinical Data: Cough.  Fever.  Congestion.  Diabetes.  Preoperative for gallbladder surgery.  CHEST - 2 VIEW  Comparison: /11/13  Findings: Low lung volumes are present, causing crowding of the pulmonary vasculature.  Airway thickening may reflect bronchitis or reactive airways disease.  No airspace opacity is identified to suggest bacterial pneumonia pattern.  No airspace opacity observed. No pleural effusion identified. Cardiac and mediastinal contours appear unremarkable.  IMPRESSION:  1. Airway thickening may reflect bronchitis or reactive airways disease.  No airspace opacity is identified to suggest bacterial pneumonia pattern. 2.  Low lung volumes.   Original Report Authenticated By: Gaylyn Rong, M.D.    Dg Cholangiogram  Operative  05/13/2012  *RADIOLOGY REPORT*  Clinical Data:   Cholelithiasis  INTRAOPERATIVE CHOLANGIOGRAM  Technique:  Cholangiographic images from the C-arm fluoroscopic device were submitted for interpretation post-operatively.  Please see the procedural report for the amount of contrast and the fluoroscopy time utilized.  Comparison:  None  Findings:  No persistent filling defects in the common duct. Intrahepatic ducts are incompletely visualized, appearing decompressed centrally. Contrast passes into the duodenum.  IMPRESSION  Negative for retained common duct stone.   Original Report Authenticated By: D. Andria Rhein, MD    Ct Abdomen Pelvis W Contrast  05/17/2012  *RADIOLOGY REPORT*  Clinical Data: Right upper quadrant pain.  Cough.  CT ABDOMEN AND PELVIS WITH CONTRAST  Technique:  Multidetector CT imaging of the abdomen and pelvis was performed following the standard protocol during bolus administration of intravenous contrast.  Contrast: OMNIPAQUE IOHEXOL 300 MG/ML  SOLN  Comparison: 03/13/2012  Findings: Atelectasis or infiltration in both lung bases.  Diffuse low attenuation change throughout the liver consistent with fatty infiltration.  Since the previous study, there has been interval cholecystectomy.  There is gas and fluid in the gallbladder fossa with infiltration in the right upper quadrant fat.  Changes may represent postoperative collection although abscess in the gallbladder fossa is suspected.  No biliary gas or bile duct dilatation.  The pancreas, spleen, adrenal glands, kidneys, and retroperitoneal lymph nodes are unremarkable.  Calcification of the abdominal aorta without aneurysm.  The stomach, small bowel, and colon are not abnormally distended.  There is infiltration in the anterior abdominal wall in the midline suggesting postoperative change. Small ventral abdominal wall hernia at the site of surgery containing fat.  Pelvis:  The uterus appears surgically absent.  No abnormal  adnexal masses.  Bladder wall is not thickened.  No free  or loculated pelvic fluid collections.  The appendix is normal.  No diverticulitis.  Normal alignment of the lumbar vertebrae.  IMPRESSION: Interval resection of the gallbladder with gas, fluid, and infiltrative changes in the gallbladder fossa worrisome for abscess.  Probable postoperative hernia containing fat at the midline.  Diffuse fatty infiltration of the liver.   Original Report Authenticated By: Burman Nieves, M.D.    Dg Chest Port 1 View  05/14/2012  *RADIOLOGY REPORT*  Clinical Data: Cough, oxygen desaturation, vomiting, recent cholecystectomy.  PORTABLE CHEST - 1 VIEW  Comparison: Chest x-ray of 05/04/2012  Findings: The lungs are not as well aerated with mild basilar atelectasis.  The heart is within upper limits of normal.  No bony abnormality is seen.  IMPRESSION: Diminished aeration with mild basilar atelectasis.   Original Report Authenticated By: Dwyane Dee, M.D.    Dg Abd Acute W/chest  05/16/2012  *RADIOLOGY REPORT*  Clinical Data: Right upper quadrant pain.  Cough and fever.  ACUTE ABDOMEN SERIES (ABDOMEN 2 VIEW & CHEST 1 VIEW)  Comparison: Chest 05/14/2012.  CT abdomen pelvis 03/13/2012  Findings: Shallow inspiration.  Heart size and pulmonary vascularity are normal.  There is linear atelectasis or infiltration in the left lung base and right upper lung.  This is progressing since the previous study.  Gas and stool throughout the abdomen.  No small or large bowel distension.  Surgical clips in the right upper quadrant.  No free air.  No abnormal air fluid levels.  No radiopaque stones. Visualized bones appear intact.  IMPRESSION: Shallow inspiration with increasing linear atelectasis or infiltration in the left lung base and right upper lung. Nonobstructive bowel gas pattern.   Original Report Authenticated By: Burman Nieves, M.D.          Subjective: "I feel miserable all over". Patient feels the breathing is better  than yesterday. Continues to complain of subjective fevers. No chills or rigors. Patient feels hungry, and she wants to eat. Denies nausea, vomiting, diarrhea. States abdominal pain is about the same. No dysuria or hematuria.  Objective: Filed Vitals:   05/17/12 0356 05/17/12 0430 05/17/12 0756 05/17/12 1021  BP: 146/83 151/76  133/78  Pulse: 100 106    Temp: 98.6 F (37 C) 98.7 F (37.1 C)  99.2 F (37.3 C)  TempSrc: Oral Oral  Oral  Resp: 16 20  20   Height:  5\' 2"  (1.575 m)    Weight:  100.5 kg (221 lb 9 oz)    SpO2:  95% 93% 95%    Intake/Output Summary (Last 24 hours) at 05/17/12 1029 Last data filed at 05/17/12 0011  Gross per 24 hour  Intake   1000 ml  Output      0 ml  Net   1000 ml   Weight change:  Exam:   General:  Pt is alert, follows commands appropriately, not in acute distress  HEENT: No icterus, No thrush,  [place]/AT  Cardiovascular: RRR, S1/S2, no rubs, no gallops  Respiratory: Bibasilar crackles. No wheezes or rhonchi. Good air movement.  Abdomen: Soft/+BS, diffusely tender isolated to her right upper quadrant, epigastric--no rebound tenderness--no peritoneal signs non distended, no guarding  Extremities: 1+ edema, No lymphangitis, No petechiae, No rashes, no synovitis  Data Reviewed: Basic Metabolic Panel:  Lab 05/17/12 1610 05/16/12 2218 05/15/12 0943  NA 139 142 138  K 3.3* 3.3* 2.9*  CL 103 106 101  CO2 23 22 25   GLUCOSE 78 83 161*  BUN 4* 7 10  CREATININE 0.43*  0.46* 0.47*  CALCIUM 8.5 9.3 8.9  MG -- -- --  PHOS -- -- --   Liver Function Tests:  Lab 05/16/12 2218 05/15/12 0943  AST 16 23  ALT 18 27  ALKPHOS 85 84  BILITOT 0.4 0.6  PROT 6.8 6.6  ALBUMIN 2.6* 2.8*    Lab 05/16/12 2218  LIPASE 26  AMYLASE --   No results found for this basename: AMMONIA:5 in the last 168 hours CBC:  Lab 05/17/12 0914 05/16/12 2218 05/15/12 0943 05/14/12 0525  WBC 11.6* 14.2* 19.8* 22.6*  NEUTROABS -- 9.5* -- --  HGB 9.8* 10.8* 10.5* 11.1*   HCT 31.5* 34.2* 33.2* 34.5*  MCV 78.0 77.0* 77.0* 77.2*  PLT 349 368 349 406*   Cardiac Enzymes: No results found for this basename: CKTOTAL:5,CKMB:5,CKMBINDEX:5,TROPONINI:5 in the last 168 hours BNP: No components found with this basename: POCBNP:5 CBG:  Lab 05/17/12 0811 05/17/12 0508 05/15/12 1728 05/15/12 1155 05/15/12 0825  GLUCAP 94 89 115* 120* 129*    No results found for this or any previous visit (from the past 240 hour(s)).   Scheduled Meds:   . sodium chloride   Intravenous STAT  . albuterol  2.5 mg Nebulization Q6H  . ciprofloxacin  400 mg Intravenous BID  . enoxaparin (LOVENOX) injection  40 mg Subcutaneous Daily  . insulin aspart  0-9 Units Subcutaneous Q4H  . piperacillin-tazobactam (ZOSYN)  IV  3.375 g Intravenous Q8H  . potassium chloride  10 mEq Intravenous Q1 Hr x 2  . vancomycin  750 mg Intravenous Q8H   Continuous Infusions:   . sodium chloride 100 mL/hr at 05/17/12 0455     Kenneith Stief, DO  [facility] Pager [phone]  If 7PM-7AM, please contact night-coverage [email or web address] Password [email or web address] 05/17/2012, 10:29 AM   LOS: 1 day

## 2012-05-17 NOTE — ED Notes (Signed)
The pts family has returned from home

## 2012-05-17 NOTE — ED Notes (Signed)
The pt is drinking oral contrast 1st cup

## 2012-05-17 NOTE — ED Notes (Signed)
The pt has finished her oral contrast c-t notified

## 2012-05-17 NOTE — ED Notes (Signed)
The pt has finished her 1st cup of contrast

## 2012-05-17 NOTE — ED Provider Notes (Signed)
Results for orders placed during the hospital encounter of 05/16/12  CBC WITH DIFFERENTIAL      Component Value Range   WBC 14.2 (*) 4.0 - 10.5 K/uL   RBC 4.44  3.87 - 5.11 MIL/uL   Hemoglobin 10.8 (*) 12.0 - 15.0 g/dL   HCT 16.1 (*) 09.6 - 04.5 %   MCV 77.0 (*) 78.0 - 100.0 fL   MCH 24.3 (*) 26.0 - 34.0 pg   MCHC 31.6  30.0 - 36.0 g/dL   RDW 40.9 (*) 81.1 - 91.4 %   Platelets 368  150 - 400 K/uL   Neutrophils Relative 67  43 - 77 %   Neutro Abs 9.5 (*) 1.7 - 7.7 K/uL   Lymphocytes Relative 20  12 - 46 %   Lymphs Abs 2.8  0.7 - 4.0 K/uL   Monocytes Relative 11  3 - 12 %   Monocytes Absolute 1.5 (*) 0.1 - 1.0 K/uL   Eosinophils Relative 2  0 - 5 %   Eosinophils Absolute 0.3  0.0 - 0.7 K/uL   Basophils Relative 0  0 - 1 %   Basophils Absolute 0.1  0.0 - 0.1 K/uL  COMPREHENSIVE METABOLIC PANEL      Component Value Range   Sodium 142  135 - 145 mEq/L   Potassium 3.3 (*) 3.5 - 5.1 mEq/L   Chloride 106  96 - 112 mEq/L   CO2 22  19 - 32 mEq/L   Glucose, Bld 83  70 - 99 mg/dL   BUN 7  6 - 23 mg/dL   Creatinine, Ser 7.82 (*) 0.50 - 1.10 mg/dL   Calcium 9.3  8.4 - 95.6 mg/dL   Total Protein 6.8  6.0 - 8.3 g/dL   Albumin 2.6 (*) 3.5 - 5.2 g/dL   AST 16  0 - 37 U/L   ALT 18  0 - 35 U/L   Alkaline Phosphatase 85  39 - 117 U/L   Total Bilirubin 0.4  0.3 - 1.2 mg/dL   GFR calc non Af Amer >90  >90 mL/min   GFR calc Af Amer >90  >90 mL/min  LIPASE, BLOOD      Component Value Range   Lipase 26  11 - 59 U/L  URINALYSIS, ROUTINE W REFLEX MICROSCOPIC      Component Value Range   Color, Urine AMBER (*) YELLOW   APPearance CLOUDY (*) CLEAR   Specific Gravity, Urine 1.031 (*) 1.005 - 1.030   pH 6.0  5.0 - 8.0   Glucose, UA NEGATIVE  NEGATIVE mg/dL   Hgb urine dipstick NEGATIVE  NEGATIVE   Bilirubin Urine SMALL (*) NEGATIVE   Ketones, ur >80 (*) NEGATIVE mg/dL   Protein, ur 213 (*) NEGATIVE mg/dL   Urobilinogen, UA 1.0  0.0 - 1.0 mg/dL   Nitrite POSITIVE (*) NEGATIVE   Leukocytes, UA  MODERATE (*) NEGATIVE  URINE MICROSCOPIC-ADD ON      Component Value Range   Squamous Epithelial / LPF FEW (*) RARE   WBC, UA 21-50  <3 WBC/hpf   RBC / HPF 0-2  <3 RBC/hpf   Bacteria, UA MANY (*) RARE      Ct Abdomen Pelvis W Contrast  05/17/2012  *RADIOLOGY REPORT*  Clinical Data: Right upper quadrant pain.  Cough.  CT ABDOMEN AND PELVIS WITH CONTRAST  Technique:  Multidetector CT imaging of the abdomen and pelvis was performed following the standard protocol during bolus administration of intravenous contrast.  Contrast: OMNIPAQUE IOHEXOL 300  MG/ML  SOLN  Comparison: 03/13/2012  Findings: Atelectasis or infiltration in both lung bases.  Diffuse low attenuation change throughout the liver consistent with fatty infiltration.  Since the previous study, there has been interval cholecystectomy.  There is gas and fluid in the gallbladder fossa with infiltration in the right upper quadrant fat.  Changes may represent postoperative collection although abscess in the gallbladder fossa is suspected.  No biliary gas or bile duct dilatation.  The pancreas, spleen, adrenal glands, kidneys, and retroperitoneal lymph nodes are unremarkable.  Calcification of the abdominal aorta without aneurysm.  The stomach, small bowel, and colon are not abnormally distended.  There is infiltration in the anterior abdominal wall in the midline suggesting postoperative change. Small ventral abdominal wall hernia at the site of surgery containing fat.  Pelvis:  The uterus appears surgically absent.  No abnormal adnexal masses.  Bladder wall is not thickened.  No free or loculated pelvic fluid collections.  The appendix is normal.  No diverticulitis.  Normal alignment of the lumbar vertebrae.  IMPRESSION: Interval resection of the gallbladder with gas, fluid, and infiltrative changes in the gallbladder fossa worrisome for abscess.  Probable postoperative hernia containing fat at the midline.  Diffuse fatty infiltration of the  liver.   Original Report Authenticated By: Burman Nieves, M.D.    Dg Abd Acute W/chest  05/16/2012  *RADIOLOGY REPORT*  Clinical Data: Right upper quadrant pain.  Cough and fever.  ACUTE ABDOMEN SERIES (ABDOMEN 2 VIEW & CHEST 1 VIEW)  Comparison: Chest 05/14/2012.  CT abdomen pelvis 03/13/2012  Findings: Shallow inspiration.  Heart size and pulmonary vascularity are normal.  There is linear atelectasis or infiltration in the left lung base and right upper lung.  This is progressing since the previous study.  Gas and stool throughout the abdomen.  No small or large bowel distension.  Surgical clips in the right upper quadrant.  No free air.  No abnormal air fluid levels.  No radiopaque stones. Visualized bones appear intact.  IMPRESSION: Shallow inspiration with increasing linear atelectasis or infiltration in the left lung base and right upper lung. Nonobstructive bowel gas pattern.   Original Report Authenticated By: Burman Nieves, M.D.     IV levaquin for UTI/ R sided PNA.  CT scan obtained/ reviewed as above.   3:06 AM [facility] consult. Dr Lindie Spruce evaluated bedside recs MED admit and IV zosyn and MED admit   Sunnie Nielsen, MD 05/17/12 [phone]

## 2012-05-18 DIAGNOSIS — M79609 Pain in unspecified limb: Secondary | ICD-10-CM

## 2012-05-18 DIAGNOSIS — M7989 Other specified soft tissue disorders: Secondary | ICD-10-CM

## 2012-05-18 LAB — GLUCOSE, CAPILLARY
Glucose-Capillary: 132 mg/dL — ABNORMAL HIGH (ref 70–99)
Glucose-Capillary: 116 mg/dL — ABNORMAL HIGH (ref 70–99)
Glucose-Capillary: 135 mg/dL — ABNORMAL HIGH (ref 70–99)
Glucose-Capillary: 147 mg/dL — ABNORMAL HIGH (ref 70–99)
Glucose-Capillary: 156 mg/dL — ABNORMAL HIGH (ref 70–99)

## 2012-05-18 LAB — URINE CULTURE: Colony Count: 90000

## 2012-05-18 LAB — CBC
HCT: 31.6 % — ABNORMAL LOW (ref 36.0–46.0)
Hemoglobin: 9.7 g/dL — ABNORMAL LOW (ref 12.0–15.0)
MCH: 24.1 pg — ABNORMAL LOW (ref 26.0–34.0)
MCHC: 30.7 g/dL (ref 30.0–36.0)
MCV: 78.4 fL (ref 78.0–100.0)
Platelets: 376 10*3/uL (ref 150–400)
RBC: 4.03 MIL/uL (ref 3.87–5.11)
RDW: 17 % — ABNORMAL HIGH (ref 11.5–15.5)
WBC: 9.8 10*3/uL (ref 4.0–10.5)

## 2012-05-18 LAB — BASIC METABOLIC PANEL
BUN: <3 mg/dL — ABNORMAL LOW (ref 6–23)
CO2: 23 mEq/L (ref 19–32)
Calcium: 9 mg/dL (ref 8.4–10.5)
Chloride: 101 mEq/L (ref 96–112)
Creatinine, Ser: 0.43 mg/dL — ABNORMAL LOW (ref 0.50–1.10)
GFR calc Af Amer: >90 mL/min (ref >90)
GFR calc non Af Amer: >90 mL/min (ref >90)
Glucose, Bld: 109 mg/dL — ABNORMAL HIGH (ref 70–99)
Potassium: 3 mEq/L — ABNORMAL LOW (ref 3.5–5.1)
Sodium: 137 mEq/L (ref 135–145)

## 2012-05-18 LAB — MAGNESIUM: Magnesium: 1.6 mg/dL (ref 1.5–2.5)

## 2012-05-18 MED ORDER — POTASSIUM CHLORIDE 20 MEQ/15ML (10%) PO LIQD
40.0000 meq | Freq: Once | ORAL | Status: DC
  Filled 2012-05-18: qty 30

## 2012-05-18 MED ORDER — ALBUTEROL SULFATE (5 MG/ML) 0.5% IN NEBU
2.5000 mg | INHALATION_SOLUTION | Freq: Three times a day (TID) | RESPIRATORY_TRACT | Status: DC
  Administered 2012-05-18 – 2012-05-20 (×7): 2.5 mg via RESPIRATORY_TRACT
  Filled 2012-05-18 (×7): qty 0.5

## 2012-05-18 MED ORDER — SODIUM CHLORIDE 0.9 % IV SOLN
INTRAVENOUS | Status: DC
  Administered 2012-05-18 – 2012-05-21 (×4): via INTRAVENOUS
  Filled 2012-05-18 (×9): qty 1000

## 2012-05-18 MED ORDER — POTASSIUM CHLORIDE 10 MEQ/100ML IV SOLN
10.0000 meq | INTRAVENOUS | Status: AC
  Administered 2012-05-18 (×2): 10 meq via INTRAVENOUS
  Filled 2012-05-18 (×2): qty 100

## 2012-05-18 MED ORDER — SODIUM CHLORIDE 0.9 % IJ SOLN
10.0000 mL | INTRAMUSCULAR | Status: DC | PRN
  Administered 2012-05-18 – 2012-05-20 (×3): 10 mL

## 2012-05-18 MED ORDER — POTASSIUM CHLORIDE 10 MEQ/100ML IV SOLN
10.0000 meq | INTRAVENOUS | Status: DC
  Administered 2012-05-18: 10 meq via INTRAVENOUS
  Filled 2012-05-18 (×5): qty 100

## 2012-05-18 MED ORDER — POTASSIUM CHLORIDE CRYS ER 20 MEQ PO TBCR
40.0000 meq | EXTENDED_RELEASE_TABLET | Freq: Once | ORAL | Status: AC
  Administered 2012-05-18: 40 meq via ORAL
  Filled 2012-05-18: qty 2

## 2012-05-18 NOTE — Progress Notes (Signed)
[facility] PROGRESS NOTE  Briana Kelley RUE:[id number] DOB: [birth year] DOA: 05/16/2012 PCP: Cala Bradford, MD  Assessment/Plan: Sepsis  -Secondary to GB fossa abscess vs HCAP -Status post percutaneous drainage gallbladder fossa abscess 05/17/12  -Blood cultures x2 sets--negative to date -Continue vancomycin, Zosyn pending culture data  - Appreciate surgery followup--as noted for IR drainage  -d/c cipro UTI--E.coli -continue zosyn for now (HCAP) -plan d/c with po abx  Fever/leukocytosis  -102.19F fever last night -WBC actually trending down from the time of discharge on 05/15/2012  -Continue empiric antibiotics, follow cultures -Gram stain from gallbladder drainage--gram-positive cocci in chains -Await final culture data from gallbladder drainage prior to  deescalation to oral antibiotics Tachycardia  -Likely physiologic the acute infectious process  -Continue IV fluids  Hypokalemia  -Replete  -Check magnesium  Diabetes mellitus type 2  -Check hemoglobin A1c--7.1 -Continue NovoLog sliding scale  Leg edema  -Venous Doppler lower extremities negative for DVT  Antibiotics:  Ciprofloxacin December 15>>>05/18/12  Vancomycin December 15>>>  Zosyn December 15>>>   Family Communication:   Pt at beside Disposition Plan:   Home when medically stable     Procedures/Studies: Dg Chest 2 View  05/04/2012  *RADIOLOGY REPORT*  Clinical Data: Cough.  Fever.  Congestion.  Diabetes.  Preoperative for gallbladder surgery.  CHEST - 2 VIEW  Comparison: /11/13  Findings: Low lung volumes are present, causing crowding of the pulmonary vasculature.  Airway thickening may reflect bronchitis or reactive airways disease.  No airspace opacity is identified to suggest bacterial pneumonia pattern.  No airspace opacity observed. No pleural effusion identified. Cardiac and mediastinal contours appear unremarkable.  IMPRESSION:  1. Airway thickening may reflect bronchitis or reactive airways  disease.  No airspace opacity is identified to suggest bacterial pneumonia pattern. 2.  Low lung volumes.   Original Report Authenticated By: Gaylyn Rong, M.D.    Dg Cholangiogram Operative  05/13/2012  *RADIOLOGY REPORT*  Clinical Data:   Cholelithiasis  INTRAOPERATIVE CHOLANGIOGRAM  Technique:  Cholangiographic images from the C-arm fluoroscopic device were submitted for interpretation post-operatively.  Please see the procedural report for the amount of contrast and the fluoroscopy time utilized.  Comparison:  None  Findings:  No persistent filling defects in the common duct. Intrahepatic ducts are incompletely visualized, appearing decompressed centrally. Contrast passes into the duodenum.  IMPRESSION  Negative for retained common duct stone.   Original Report Authenticated By: D. Andria Rhein, MD    Ct Guided Abscess Drain  05/17/2012  *RADIOLOGY REPORT*  CT GUIDED ABSCESS DRAIN  Date: 05/17/2012  Clinical History: 51 year old female status post laparoscopic cholecystectomy on 05/13/2012.  She has had continued postoperative pain, fever and chills. CT scan last night confirms a fluid and gas collection in the gallbladder fossa concerning for abscess.  She presents for CT-guided abscess drain placement.  Procedures Performed: 1. CT guided placement of a 12-French all-purpose drainage catheter in the gallbladder fossa  Interventional Radiologist:  Sterling Big, MD  Sedation: Moderate (conscious) sedation was used. Two mg Versed, 100 mcg Fentanyl were administered intravenously.  The patient's vital signs were monitored continuously by radiology nursing throughout the procedure.  Sedation Time: 45 minutes  PROCEDURE/FINDINGS:   Informed consent was obtained from the patient following explanation of the procedure, risks, benefits and alternatives. The patient understands, agrees and consents for the procedure. All questions were addressed. A time out was performed.  Maximal barrier sterile  technique utilized including caps, mask, sterile gowns, sterile gloves, large sterile drape, hand hygiene, and betadine  skin prep.  A planning axial CT scan was performed.  The gas and fluid collection in the gallbladder fossa was successfully identified. There is a narrow window cephalad to the hepatic flexure of the colon.  An appropriate skin entry site was selected and marked. Local anesthesia was achieved by infiltration of 1% lidocaine. Under direct CT fluoroscopic guidance a 20 cm 18 gauge trocar needle was carefully advanced through the anterior abdominal wall over the hepatic flexure and into the sub hepatic fluid collection. Axial CT imaging with sagittal reformats were used to confirm the needle trajectory and location.  Aspiration of purulent red brown material also confirmed needle tip location within the abscess.  A 0.035 inches wire was then coiled within the abscess cavity and the tract serially dilated to 12-French.  A 12 [place] all- purpose drainage catheter was then advanced over the wire and formed within the abscess collection.  A total of 90 ml of red/brown fluid was successfully aspirated and sent for culture. The drain was secured in position with O Prolene suture and a plastic bumper as well as with an adhesive fixation device.  Post aspiration CT imaging confirms near total resolution of the abscess collection.  The patient tolerated the procedure well, there is no immediate complication.  IMPRESSION:  Successful placement of a 12-French drainage catheter in the gallbladder fossa abscess.  A total of 90 ml of purulent reddish brown fluid was aspirated and sent for culture. Post aspiration imaging confirms near total resolution of the abscess collection.  Drain management:  Recommend maintaining drains to bulb suction until systemic symptoms have resolved and drain output is minimal. Consider follow-up CT scan prior to tube removal.  Signed,  Sterling Big, MD Vascular &  Interventional Radiologist [facility]   Original Report Authenticated By: Malachy Moan, M.D.    Ct Abdomen Pelvis W Contrast  05/17/2012  *RADIOLOGY REPORT*  Clinical Data: Right upper quadrant pain.  Cough.  CT ABDOMEN AND PELVIS WITH CONTRAST  Technique:  Multidetector CT imaging of the abdomen and pelvis was performed following the standard protocol during bolus administration of intravenous contrast.  Contrast: OMNIPAQUE IOHEXOL 300 MG/ML  SOLN  Comparison: 03/13/2012  Findings: Atelectasis or infiltration in both lung bases.  Diffuse low attenuation change throughout the liver consistent with fatty infiltration.  Since the previous study, there has been interval cholecystectomy.  There is gas and fluid in the gallbladder fossa with infiltration in the right upper quadrant fat.  Changes may represent postoperative collection although abscess in the gallbladder fossa is suspected.  No biliary gas or bile duct dilatation.  The pancreas, spleen, adrenal glands, kidneys, and retroperitoneal lymph nodes are unremarkable.  Calcification of the abdominal aorta without aneurysm.  The stomach, small bowel, and colon are not abnormally distended.  There is infiltration in the anterior abdominal wall in the midline suggesting postoperative change. Small ventral abdominal wall hernia at the site of surgery containing fat.  Pelvis:  The uterus appears surgically absent.  No abnormal adnexal masses.  Bladder wall is not thickened.  No free or loculated pelvic fluid collections.  The appendix is normal.  No diverticulitis.  Normal alignment of the lumbar vertebrae.  IMPRESSION: Interval resection of the gallbladder with gas, fluid, and infiltrative changes in the gallbladder fossa worrisome for abscess.  Probable postoperative hernia containing fat at the midline.  Diffuse fatty infiltration of the liver.   Original Report Authenticated By: Burman Nieves, M.D.    Dg Chest [facility]  1 View  05/14/2012   *RADIOLOGY REPORT*  Clinical Data: Cough, oxygen desaturation, vomiting, recent cholecystectomy.  PORTABLE CHEST - 1 VIEW  Comparison: Chest x-ray of 05/04/2012  Findings: The lungs are not as well aerated with mild basilar atelectasis.  The heart is within upper limits of normal.  No bony abnormality is seen.  IMPRESSION: Diminished aeration with mild basilar atelectasis.   Original Report Authenticated By: Dwyane Dee, M.D.    Dg Abd Acute W/chest  05/16/2012  *RADIOLOGY REPORT*  Clinical Data: Right upper quadrant pain.  Cough and fever.  ACUTE ABDOMEN SERIES (ABDOMEN 2 VIEW & CHEST 1 VIEW)  Comparison: Chest 05/14/2012.  CT abdomen pelvis 03/13/2012  Findings: Shallow inspiration.  Heart size and pulmonary vascularity are normal.  There is linear atelectasis or infiltration in the left lung base and right upper lung.  This is progressing since the previous study.  Gas and stool throughout the abdomen.  No small or large bowel distension.  Surgical clips in the right upper quadrant.  No free air.  No abnormal air fluid levels.  No radiopaque stones. Visualized bones appear intact.  IMPRESSION: Shallow inspiration with increasing linear atelectasis or infiltration in the left lung base and right upper lung. Nonobstructive bowel gas pattern.   Original Report Authenticated By: Burman Nieves, M.D.          Subjective: Patient is feeling better today, but still complains of abdominal pain. Denies any fevers, chills, chest pain, shortness breath, nausea, vomiting, diarrhea.  Objective: Filed Vitals:   05/18/12 0200 05/18/12 0211 05/18/12 0600 05/18/12 1446  BP: 138/84  145/77 142/74  Pulse: 85  79 106  Temp: 97.8 F (36.6 C)  98 F (36.7 C) 98.1 F (36.7 C)  TempSrc: Oral  Oral Oral  Resp: 20  20 18   Height:      Weight:      SpO2: 92% 94% 95% 94%    Intake/Output Summary (Last 24 hours) at 05/18/12 1601 Last data filed at 05/18/12 4098  Gross per 24 hour  Intake      0 ml  Output      20 ml  Net    -20 ml   Weight change:  Exam:   General:  Pt is alert, follows commands appropriately, not in acute distress  HEENT: No icterus, No thrush,  [place]/AT  Cardiovascular: RRR, S1/S2, no rubs, no gallops  Respiratory: CTA bilaterally, no wheezing, no crackles, no rhonchi  Abdomen: Soft/+BS, epigastric and right upper quadrant tenderness. No peritoneal signs. No rebound tenderness non distended, no guarding  Extremities: Trace edema, No lymphangitis, No petechiae, No rashes, no synovitis  Data Reviewed: Basic Metabolic Panel:  Lab 05/18/12 1191 05/17/12 0914 05/16/12 2218 05/15/12 0943  NA 137 139 142 138  K 3.0* 3.3* 3.3* 2.9*  CL 101 103 106 101  CO2 23 23 22 25   GLUCOSE 109* 78 83 161*  BUN <3* 4* 7 10  CREATININE 0.43* 0.43* 0.46* 0.47*  CALCIUM 9.0 8.5 9.3 8.9  MG 1.6 -- -- --  PHOS -- -- -- --   Liver Function Tests:  Lab 05/16/12 2218 05/15/12 0943  AST 16 23  ALT 18 27  ALKPHOS 85 84  BILITOT 0.4 0.6  PROT 6.8 6.6  ALBUMIN 2.6* 2.8*    Lab 05/16/12 2218  LIPASE 26  AMYLASE --   No results found for this basename: AMMONIA:5 in the last 168 hours CBC:  Lab 05/18/12 0610 05/17/12 0914 05/16/12 2218 05/15/12 4782  05/14/12 0525  WBC 9.8 11.6* 14.2* 19.8* 22.6*  NEUTROABS -- -- 9.5* -- --  HGB 9.7* 9.8* 10.8* 10.5* 11.1*  HCT 31.6* 31.5* 34.2* 33.2* 34.5*  MCV 78.4 78.0 77.0* 77.0* 77.2*  PLT 376 349 368 349 406*   Cardiac Enzymes: No results found for this basename: CKTOTAL:5,CKMB:5,CKMBINDEX:5,TROPONINI:5 in the last 168 hours BNP: No components found with this basename: POCBNP:5 CBG:  Lab 05/18/12 1207 05/18/12 0641 05/17/12 2205 05/17/12 1709 05/17/12 0811  GLUCAP 135* 116* 132* 81 94    Recent Results (from the past 240 hour(s))  URINE CULTURE     Status: Normal   Collection Time   05/16/12 10:45 PM      Component Value Range Status Comment   Specimen Description URINE, CLEAN CATCH   Final    Special Requests ADDED 2311    Final    Culture  Setup Time 05/17/2012 01:36   Final    Colony Count 90,000 COLONIES/ML   Final    Culture ESCHERICHIA COLI   Final    Report Status 05/18/2012 FINAL   Final    Organism ID, Bacteria ESCHERICHIA COLI   Final   CULTURE, BLOOD (ROUTINE X 2)     Status: Normal (Preliminary result)   Collection Time   05/17/12  9:16 AM      Component Value Range Status Comment   Specimen Description BLOOD RIGHT ARM   Final    Special Requests BOTTLES DRAWN AEROBIC AND ANAEROBIC 10CC   Final    Culture  Setup Time 05/17/2012 16:06   Final    Culture     Final    Value:        BLOOD CULTURE RECEIVED NO GROWTH TO DATE CULTURE WILL BE HELD FOR 5 DAYS BEFORE ISSUING A FINAL NEGATIVE REPORT   Report Status PENDING   Incomplete   CULTURE, BLOOD (ROUTINE X 2)     Status: Normal (Preliminary result)   Collection Time   05/17/12  9:25 AM      Component Value Range Status Comment   Specimen Description BLOOD RIGHT HAND   Final    Special Requests BOTTLES DRAWN AEROBIC AND ANAEROBIC 10CC   Final    Culture  Setup Time 05/17/2012 16:06   Final    Culture     Final    Value:        BLOOD CULTURE RECEIVED NO GROWTH TO DATE CULTURE WILL BE HELD FOR 5 DAYS BEFORE ISSUING A FINAL NEGATIVE REPORT   Report Status PENDING   Incomplete   CULTURE, ROUTINE-ABSCESS     Status: Normal (Preliminary result)   Collection Time   05/17/12  3:33 PM      Component Value Range Status Comment   Specimen Description GALL BLADDER   Final    Special Requests NONE   Final    Gram Stain PENDING   Incomplete    Culture Culture reincubated for better growth   Final    Report Status PENDING   Incomplete   ANAEROBIC CULTURE     Status: Normal (Preliminary result)   Collection Time   05/17/12  3:33 PM      Component Value Range Status Comment   Specimen Description GALL BLADDER   Final    Special Requests NONE   Final    Gram Stain     Final    Value: ABUNDANT WBC PRESENT, PREDOMINANTLY PMN     NO SQUAMOUS EPITHELIAL  CELLS SEEN  RARE GRAM POSITIVE COCCI IN PAIRS   Culture     Final    Value: NO ANAEROBES ISOLATED; CULTURE IN PROGRESS FOR 5 DAYS   Report Status PENDING   Incomplete      Scheduled Meds:   . albuterol  2.5 mg Nebulization TID  . ciprofloxacin  400 mg Intravenous BID  . enoxaparin (LOVENOX) injection  40 mg Subcutaneous Daily  . insulin aspart  0-9 Units Subcutaneous Q4H  . piperacillin-tazobactam (ZOSYN)  IV  3.375 g Intravenous Q8H  . vancomycin  750 mg Intravenous Q8H   Continuous Infusions:   . sodium chloride 100 mL/hr at 05/17/12 0455     Amisadai Woodford, DO  [facility] Pager [phone]  If 7PM-7AM, please contact night-coverage [email or web address] Password [email or web address] 05/18/2012, 4:01 PM   LOS: 2 days

## 2012-05-18 NOTE — Progress Notes (Signed)
Peripherally Inserted Central Catheter/Midline Placement  The IV Nurse has discussed with the patient and/or persons authorized to consent for the patient, the purpose of this procedure and the potential benefits and risks involved with this procedure.  The benefits include less needle sticks, lab draws from the catheter and patient may be discharged home with the catheter.  Risks include, but not limited to, infection, bleeding, blood clot (thrombus formation), and puncture of an artery; nerve damage and irregular heat beat.  Alternatives to this procedure were also discussed.  PICC/Midline Placement Documentation        Briana Kelley 05/18/2012, 12:21 PM

## 2012-05-18 NOTE — Progress Notes (Signed)
States she is feeling better slowly.  Pain when she moves around a lot.  Fevers last night after drain placement yesterday. JP with clear SS fluid, no bile  ATTENDING ADDENDUM:  I personally reviewed patient's record, examined the patient, and formulated the following plan:  Advance diet as tolerated.  Drain removal per IR protocols.  Could transition to PO antibiotics tomorrow if no more fevers.  Would like her to go home on 2 weeks cipro/flagyl for UTI and GB fossa abscess.  Return to see me in one week after discharge.

## 2012-05-18 NOTE — Progress Notes (Signed)
Subjective: Pt ok, sore but no new pain. Denies N/V but didn't get a chance to eat this am, unsure why??  Objective: Physical Exam: BP 145/77  Pulse 79  Temp 98 F (36.7 C) (Oral)  Resp 20  Ht 5\' 2"  (1.575 m)  Wt 221 lb 9 oz (100.5 kg)  BMI 40.52 kg/m2  SpO2 95% RUQ drain intact, site clean, dry Output serosanguinous, no obvious bile noted.   Labs: CBC  Basename 05/18/12 0610 05/17/12 0914  WBC 9.8 11.6*  HGB 9.7* 9.8*  HCT 31.6* 31.5*  PLT 376 349   BMET  Basename 05/18/12 0610 05/17/12 0914  NA 137 139  K 3.0* 3.3*  CL 101 103  CO2 23 23  GLUCOSE 109* 78  BUN <3* 4*  CREATININE 0.43* 0.43*  CALCIUM 9.0 8.5   LFT  Basename 05/16/12 2218  PROT 6.8  ALBUMIN 2.6*  AST 16  ALT 18  ALKPHOS 85  BILITOT 0.4  BILIDIR --  IBILI --  LIPASE 26   PT/INR  Basename 05/17/12 1300  LABPROT 15.1  INR 1.21     Studies/Results: Ct Guided Abscess Drain  05/17/2012  *RADIOLOGY REPORT*  CT GUIDED ABSCESS DRAIN  Date: 05/17/2012  Clinical History: 51 year old female status post laparoscopic cholecystectomy on 05/13/2012.  She has had continued postoperative pain, fever and chills. CT scan last night confirms a fluid and gas collection in the gallbladder fossa concerning for abscess.  She presents for CT-guided abscess drain placement.  Procedures Performed: 1. CT guided placement of a 12-French all-purpose drainage catheter in the gallbladder fossa  Interventional Radiologist:  Sterling Big, MD  Sedation: Moderate (conscious) sedation was used. Two mg Versed, 100 mcg Fentanyl were administered intravenously.  The patient's vital signs were monitored continuously by radiology nursing throughout the procedure.  Sedation Time: 45 minutes  PROCEDURE/FINDINGS:   Informed consent was obtained from the patient following explanation of the procedure, risks, benefits and alternatives. The patient understands, agrees and consents for the procedure. All questions were addressed.  A time out was performed.  Maximal barrier sterile technique utilized including caps, mask, sterile gowns, sterile gloves, large sterile drape, hand hygiene, and betadine skin prep.  A planning axial CT scan was performed.  The gas and fluid collection in the gallbladder fossa was successfully identified. There is a narrow window cephalad to the hepatic flexure of the colon.  An appropriate skin entry site was selected and marked. Local anesthesia was achieved by infiltration of 1% lidocaine. Under direct CT fluoroscopic guidance a 20 cm 18 gauge trocar needle was carefully advanced through the anterior abdominal wall over the hepatic flexure and into the sub hepatic fluid collection. Axial CT imaging with sagittal reformats were used to confirm the needle trajectory and location.  Aspiration of purulent red brown material also confirmed needle tip location within the abscess.  A 0.035 inches wire was then coiled within the abscess cavity and the tract serially dilated to 12-French.  A 12 [place] all- purpose drainage catheter was then advanced over the wire and formed within the abscess collection.  A total of 90 ml of red/brown fluid was successfully aspirated and sent for culture. The drain was secured in position with O Prolene suture and a plastic bumper as well as with an adhesive fixation device.  Post aspiration CT imaging confirms near total resolution of the abscess collection.  The patient tolerated the procedure well, there is no immediate complication.  IMPRESSION:  Successful placement of a  12-French drainage catheter in the gallbladder fossa abscess.  A total of 90 ml of purulent reddish brown fluid was aspirated and sent for culture. Post aspiration imaging confirms near total resolution of the abscess collection.  Drain management:  Recommend maintaining drains to bulb suction until systemic symptoms have resolved and drain output is minimal. Consider follow-up CT scan prior to tube removal.   Signed,  Sterling Big, MD Vascular & Interventional Radiologist [facility] Radiology   Original Report Authenticated By: Malachy Moan, M.D.    Ct Abdomen Pelvis W Contrast  05/17/2012  *RADIOLOGY REPORT*  Clinical Data: Right upper quadrant pain.  Cough.  CT ABDOMEN AND PELVIS WITH CONTRAST  Technique:  Multidetector CT imaging of the abdomen and pelvis was performed following the standard protocol during bolus administration of intravenous contrast.  Contrast: OMNIPAQUE IOHEXOL 300 MG/ML  SOLN  Comparison: 03/13/2012  Findings: Atelectasis or infiltration in both lung bases.  Diffuse low attenuation change throughout the liver consistent with fatty infiltration.  Since the previous study, there has been interval cholecystectomy.  There is gas and fluid in the gallbladder fossa with infiltration in the right upper quadrant fat.  Changes may represent postoperative collection although abscess in the gallbladder fossa is suspected.  No biliary gas or bile duct dilatation.  The pancreas, spleen, adrenal glands, kidneys, and retroperitoneal lymph nodes are unremarkable.  Calcification of the abdominal aorta without aneurysm.  The stomach, small bowel, and colon are not abnormally distended.  There is infiltration in the anterior abdominal wall in the midline suggesting postoperative change. Small ventral abdominal wall hernia at the site of surgery containing fat.  Pelvis:  The uterus appears surgically absent.  No abnormal adnexal masses.  Bladder wall is not thickened.  No free or loculated pelvic fluid collections.  The appendix is normal.  No diverticulitis.  Normal alignment of the lumbar vertebrae.  IMPRESSION: Interval resection of the gallbladder with gas, fluid, and infiltrative changes in the gallbladder fossa worrisome for abscess.  Probable postoperative hernia containing fat at the midline.  Diffuse fatty infiltration of the liver.   Original Report Authenticated By: Burman Nieves,  M.D.    Dg Abd Acute W/chest  05/16/2012  *RADIOLOGY REPORT*  Clinical Data: Right upper quadrant pain.  Cough and fever.  ACUTE ABDOMEN SERIES (ABDOMEN 2 VIEW & CHEST 1 VIEW)  Comparison: Chest 05/14/2012.  CT abdomen pelvis 03/13/2012  Findings: Shallow inspiration.  Heart size and pulmonary vascularity are normal.  There is linear atelectasis or infiltration in the left lung base and right upper lung.  This is progressing since the previous study.  Gas and stool throughout the abdomen.  No small or large bowel distension.  Surgical clips in the right upper quadrant.  No free air.  No abnormal air fluid levels.  No radiopaque stones. Visualized bones appear intact.  IMPRESSION: Shallow inspiration with increasing linear atelectasis or infiltration in the left lung base and right upper lung. Nonobstructive bowel gas pattern.   Original Report Authenticated By: Burman Nieves, M.D.     Assessment/Plan: S/p perc drain GB fossa fluid collection 12/15 Cont to follow Plans per [facility] and primary    LOS: 2 days    Brayton El PA-C 05/18/2012 9:58 AM

## 2012-05-18 NOTE — Progress Notes (Signed)
VASCULAR LAB PRELIMINARY  PRELIMINARY  PRELIMINARY  PRELIMINARY  Bilateral lower extremity venous Dopplers completed.    Preliminary report:  There is no DVT or SVT noted in the bilateral lower extremities.  Kym Scannell, RVT 05/18/2012, 9:06 AM

## 2012-05-18 NOTE — Progress Notes (Signed)
Subjective: Flat affect this morning; states that she feels like "crap". Sitting on bedside eating clear liquid breakfast. Does not appear to be in any acute distress.   Objective: Vital signs in last 24 hours: Temp:  [97.8 F (36.6 C)-102.3 F (39.1 C)] 98 F (36.7 C) (12/16 0600) Pulse Rate:  [79-116] 79  (12/16 0600) Resp:  [20-30] 20  (12/16 0600) BP: (124-152)/(71-114) 145/77 mmHg (12/16 0600) SpO2:  [89 %-98 %] 95 % (12/16 0600) Last BM Date: 05/17/12  Intake/Output from previous day: 12/15 0701 - 12/16 0700 In: 100 [IV Piggyback:100] Out: 20 [Drains:20] Intake/Output this shift:    General appearance: alert, cooperative, appears stated age, fatigued, no distress and moderately obese Chest: CTA Cardiac: RRR Abdomen: soft, non tender, JP in RLQ (SS drng 20ml recorded) dressing is C/D/I Labs: Hypokalemic (repleting), WBC wnl, H&H stable, Bun and Creatine stable. VSS, afebrile  Lab Results:   [facility] 05/18/12 0610 05/17/12 0914  WBC 9.8 11.6*  HGB 9.7* 9.8*  HCT 31.6* 31.5*  PLT 376 349   BMET  Basename 05/18/12 0610 05/17/12 0914  NA 137 139  K 3.0* 3.3*  CL 101 103  CO2 23 23  GLUCOSE 109* 78  BUN <3* 4*  CREATININE 0.43* 0.43*  CALCIUM 9.0 8.5   PT/INR  Basename 05/17/12 1300  LABPROT 15.1  INR 1.21   ABG No results found for this basename: PHART:2,PCO2:2,PO2:2,HCO3:2 in the last 72 hours  Studies/Results: Ct Guided Abscess Drain  05/17/2012  *RADIOLOGY REPORT*  CT GUIDED ABSCESS DRAIN  Date: 05/17/2012  Clinical History: 51 year old female status post laparoscopic cholecystectomy on 05/13/2012.  She has had continued postoperative pain, fever and chills. CT scan last night confirms a fluid and gas collection in the gallbladder fossa concerning for abscess.  She presents for CT-guided abscess drain placement.  Procedures Performed: 1. CT guided placement of a 12-French all-purpose drainage catheter in the gallbladder fossa  Interventional  Radiologist:  Sterling Big, MD  Sedation: Moderate (conscious) sedation was used. Two mg Versed, 100 mcg Fentanyl were administered intravenously.  The patient's vital signs were monitored continuously by radiology nursing throughout the procedure.  Sedation Time: 45 minutes  PROCEDURE/FINDINGS:   Informed consent was obtained from the patient following explanation of the procedure, risks, benefits and alternatives. The patient understands, agrees and consents for the procedure. All questions were addressed. A time out was performed.  Maximal barrier sterile technique utilized including caps, mask, sterile gowns, sterile gloves, large sterile drape, hand hygiene, and betadine skin prep.  A planning axial CT scan was performed.  The gas and fluid collection in the gallbladder fossa was successfully identified. There is a narrow window cephalad to the hepatic flexure of the colon.  An appropriate skin entry site was selected and marked. Local anesthesia was achieved by infiltration of 1% lidocaine. Under direct CT fluoroscopic guidance a 20 cm 18 gauge trocar needle was carefully advanced through the anterior abdominal wall over the hepatic flexure and into the sub hepatic fluid collection. Axial CT imaging with sagittal reformats were used to confirm the needle trajectory and location.  Aspiration of purulent red brown material also confirmed needle tip location within the abscess.  A 0.035 inches wire was then coiled within the abscess cavity and the tract serially dilated to 12-French.  A 12 [place] all- purpose drainage catheter was then advanced over the wire and formed within the abscess collection.  A total of 90 ml of red/brown fluid was successfully aspirated and  sent for culture. The drain was secured in position with O Prolene suture and a plastic bumper as well as with an adhesive fixation device.  Post aspiration CT imaging confirms near total resolution of the abscess collection.  The patient  tolerated the procedure well, there is no immediate complication.  IMPRESSION:  Successful placement of a 12-French drainage catheter in the gallbladder fossa abscess.  A total of 90 ml of purulent reddish brown fluid was aspirated and sent for culture. Post aspiration imaging confirms near total resolution of the abscess collection.  Drain management:  Recommend maintaining drains to bulb suction until systemic symptoms have resolved and drain output is minimal. Consider follow-up CT scan prior to tube removal.  Signed,  Sterling Big, MD Vascular & Interventional Radiologist [facility] Radiology   Original Report Authenticated By: Malachy Moan, M.D.    Ct Abdomen Pelvis W Contrast  05/17/2012  *RADIOLOGY REPORT*  Clinical Data: Right upper quadrant pain.  Cough.  CT ABDOMEN AND PELVIS WITH CONTRAST  Technique:  Multidetector CT imaging of the abdomen and pelvis was performed following the standard protocol during bolus administration of intravenous contrast.  Contrast: OMNIPAQUE IOHEXOL 300 MG/ML  SOLN  Comparison: 03/13/2012  Findings: Atelectasis or infiltration in both lung bases.  Diffuse low attenuation change throughout the liver consistent with fatty infiltration.  Since the previous study, there has been interval cholecystectomy.  There is gas and fluid in the gallbladder fossa with infiltration in the right upper quadrant fat.  Changes may represent postoperative collection although abscess in the gallbladder fossa is suspected.  No biliary gas or bile duct dilatation.  The pancreas, spleen, adrenal glands, kidneys, and retroperitoneal lymph nodes are unremarkable.  Calcification of the abdominal aorta without aneurysm.  The stomach, small bowel, and colon are not abnormally distended.  There is infiltration in the anterior abdominal wall in the midline suggesting postoperative change. Small ventral abdominal wall hernia at the site of surgery containing fat.  Pelvis:  The uterus  appears surgically absent.  No abnormal adnexal masses.  Bladder wall is not thickened.  No free or loculated pelvic fluid collections.  The appendix is normal.  No diverticulitis.  Normal alignment of the lumbar vertebrae.  IMPRESSION: Interval resection of the gallbladder with gas, fluid, and infiltrative changes in the gallbladder fossa worrisome for abscess.  Probable postoperative hernia containing fat at the midline.  Diffuse fatty infiltration of the liver.   Original Report Authenticated By: Burman Nieves, M.D.    Dg Abd Acute W/chest  05/16/2012  *RADIOLOGY REPORT*  Clinical Data: Right upper quadrant pain.  Cough and fever.  ACUTE ABDOMEN SERIES (ABDOMEN 2 VIEW & CHEST 1 VIEW)  Comparison: Chest 05/14/2012.  CT abdomen pelvis 03/13/2012  Findings: Shallow inspiration.  Heart size and pulmonary vascularity are normal.  There is linear atelectasis or infiltration in the left lung base and right upper lung.  This is progressing since the previous study.  Gas and stool throughout the abdomen.  No small or large bowel distension.  Surgical clips in the right upper quadrant.  No free air.  No abnormal air fluid levels.  No radiopaque stones. Visualized bones appear intact.  IMPRESSION: Shallow inspiration with increasing linear atelectasis or infiltration in the left lung base and right upper lung. Nonobstructive bowel gas pattern.   Original Report Authenticated By: Burman Nieves, M.D.     Anti-infectives: Anti-infectives     Start     Dose/Rate Route Frequency Ordered Stop   05/17/12  1800   ciprofloxacin (CIPRO) IVPB 400 mg        400 mg 200 mL/hr over 60 Minutes Intravenous 2 times daily 05/17/12 0438     05/17/12 1400   vancomycin (VANCOCIN) 750 mg in sodium chloride 0.9 % 150 mL IVPB        750 mg 150 mL/hr over 60 Minutes Intravenous Every 8 hours 05/17/12 0500     05/17/12 1200  piperacillin-tazobactam (ZOSYN) IVPB 3.375 g       3.375 g 12.5 mL/hr over 240 Minutes Intravenous  Every 8 hours 05/17/12 0458     05/17/12 0530   vancomycin (VANCOCIN) 2,000 mg in sodium chloride 0.9 % 500 mL IVPB        2,000 mg 250 mL/hr over 120 Minutes Intravenous  Once 05/17/12 0458 05/17/12 0729   05/17/12 0330   vancomycin (VANCOCIN) IVPB 1000 mg/200 mL premix  Status:  Discontinued        1,000 mg 200 mL/hr over 60 Minutes Intravenous  Once 05/17/12 0327 05/17/12 0451   05/17/12 0315   ciprofloxacin (CIPRO) IVPB 400 mg        400 mg 200 mL/hr over 60 Minutes Intravenous  Once 05/17/12 0305 05/17/12 0504   05/17/12 0315  piperacillin-tazobactam (ZOSYN) IVPB 3.375 g       3.375 g 12.5 mL/hr over 240 Minutes Intravenous  Once 05/17/12 0309 05/17/12 0731   05/17/12 0000   levofloxacin (LEVAQUIN) IVPB 750 mg        750 mg 100 mL/hr over 90 Minutes Intravenous  Once 05/16/12 2350 05/17/12 0220          Assessment/Plan: s/p * No surgery found *  Patient Active Problem List  Diagnosis  . Cholecystitis  . HCAP (healthcare-associated pneumonia)  . Fever  . Abdominal  pain, other specified site  . Hypokalemia  . Leukocytosis  . SOB (shortness of breath)  . Diabetes mellitus  . Hyperlipidemia  . Hypothyroid  . Obesity  . Migraines  . Obstructive sleep apnea (adult) (pediatric)  . UTI (lower urinary tract infection)  . Sepsis  s/p perc drain placement for GB fossa abscess Hypokalemia (repleting)  1. Adv diet as tolerated 2. Continue IV Abx, should be able to transition to po soon. 3. Follow clinical picture, recheck BMP in am.   LOS: 2 days    Briana Kelley [facility] Surgery Pager # [phone] 05/18/2012

## 2012-05-18 NOTE — Progress Notes (Addendum)
Pt had oral temp of 102.3 at 1851, was given 650 mg PO Tylenol.  Temp was rechecked and down to 100.7.  Pt reports feeling much better and is resting.  Will continue to monitor for fever.  Pt 98.7 at 0130

## 2012-05-19 DIAGNOSIS — T8143XA Infection following a procedure, organ and space surgical site, initial encounter: Secondary | ICD-10-CM | POA: Diagnosis present

## 2012-05-19 DIAGNOSIS — T8140XA Infection following a procedure, unspecified, initial encounter: Secondary | ICD-10-CM

## 2012-05-19 DIAGNOSIS — K651 Peritoneal abscess: Secondary | ICD-10-CM

## 2012-05-19 LAB — BASIC METABOLIC PANEL
BUN: <3 mg/dL — ABNORMAL LOW (ref 6–23)
CO2: 26 mEq/L (ref 19–32)
Calcium: 9.2 mg/dL (ref 8.4–10.5)
Chloride: 104 mEq/L (ref 96–112)
Creatinine, Ser: 0.46 mg/dL — ABNORMAL LOW (ref 0.50–1.10)
GFR calc Af Amer: >90 mL/min (ref >90)
GFR calc non Af Amer: >90 mL/min (ref >90)
Glucose, Bld: 103 mg/dL — ABNORMAL HIGH (ref 70–99)
Potassium: 3.5 mEq/L (ref 3.5–5.1)
Sodium: 142 mEq/L (ref 135–145)

## 2012-05-19 LAB — GLUCOSE, CAPILLARY
Glucose-Capillary: 106 mg/dL — ABNORMAL HIGH (ref 70–99)
Glucose-Capillary: 114 mg/dL — ABNORMAL HIGH (ref 70–99)
Glucose-Capillary: 120 mg/dL — ABNORMAL HIGH (ref 70–99)
Glucose-Capillary: 133 mg/dL — ABNORMAL HIGH (ref 70–99)
Glucose-Capillary: 148 mg/dL — ABNORMAL HIGH (ref 70–99)
Glucose-Capillary: 168 mg/dL — ABNORMAL HIGH (ref 70–99)

## 2012-05-19 LAB — CBC
HCT: 32.4 % — ABNORMAL LOW (ref 36.0–46.0)
Hemoglobin: 10.1 g/dL — ABNORMAL LOW (ref 12.0–15.0)
MCH: 24.5 pg — ABNORMAL LOW (ref 26.0–34.0)
MCHC: 31.2 g/dL (ref 30.0–36.0)
MCV: 78.5 fL (ref 78.0–100.0)
Platelets: 422 10*3/uL — ABNORMAL HIGH (ref 150–400)
RBC: 4.13 MIL/uL (ref 3.87–5.11)
RDW: 16.9 % — ABNORMAL HIGH (ref 11.5–15.5)
WBC: 10.9 10*3/uL — ABNORMAL HIGH (ref 4.0–10.5)

## 2012-05-19 MED ORDER — METRONIDAZOLE IN NACL 5-0.79 MG/ML-% IV SOLN
500.0000 mg | Freq: Three times a day (TID) | INTRAVENOUS | Status: DC
  Administered 2012-05-19 – 2012-05-20 (×3): 500 mg via INTRAVENOUS
  Filled 2012-05-19 (×4): qty 100

## 2012-05-19 MED ORDER — LEVOFLOXACIN 750 MG PO TABS
750.0000 mg | ORAL_TABLET | Freq: Every day | ORAL | Status: DC
  Filled 2012-05-19: qty 1

## 2012-05-19 MED ORDER — LEVOFLOXACIN IN D5W 750 MG/150ML IV SOLN
750.0000 mg | INTRAVENOUS | Status: DC
  Administered 2012-05-19: 750 mg via INTRAVENOUS
  Filled 2012-05-19 (×2): qty 150

## 2012-05-19 MED ORDER — METRONIDAZOLE 500 MG PO TABS
500.0000 mg | ORAL_TABLET | Freq: Three times a day (TID) | ORAL | Status: DC
  Administered 2012-05-19: 500 mg via ORAL
  Filled 2012-05-19 (×3): qty 1

## 2012-05-19 NOTE — Progress Notes (Signed)
HCAP (healthcare-associated pneumonia)  Subjective: Doing well.  Tolerating a diet  Objective: Vital signs in last 24 hours: Temp:  [97.8 F (36.6 C)-99.2 F (37.3 C)] 98 F (36.7 C) (12/17 0921) Pulse Rate:  [80-106] 80  (12/17 0921) Resp:  [16-18] 18  (12/17 0921) BP: (136-148)/(68-92) 148/68 mmHg (12/17 0921) SpO2:  [91 %-94 %] 94 % (12/17 0613) Last BM Date: 05/18/12  Intake/Output from previous day: 12/16 0701 - 12/17 0700 In: 1320 [P.O.:120; I.V.:1200] Out: 10 [Drains:10] Intake/Output this shift: Total I/O In: 300 [P.O.:300] Out: -   General appearance: alert and cooperative GI: soft, non-tender; bowel sounds normal; no masses,  no organomegaly  Lab Results:  Results for orders placed during the hospital encounter of 05/16/12 (from the past 24 hour(s))  GLUCOSE, CAPILLARY     Status: Abnormal   Collection Time   05/18/12  4:44 PM      Component Value Range   Glucose-Capillary 147 (*) 70 - 99 mg/dL  GLUCOSE, CAPILLARY     Status: Abnormal   Collection Time   05/18/12 10:02 PM      Component Value Range   Glucose-Capillary 156 (*) 70 - 99 mg/dL  GLUCOSE, CAPILLARY     Status: Abnormal   Collection Time   05/19/12 12:16 AM      Component Value Range   Glucose-Capillary 133 (*) 70 - 99 mg/dL  GLUCOSE, CAPILLARY     Status: Abnormal   Collection Time   05/19/12  4:07 AM      Component Value Range   Glucose-Capillary 106 (*) 70 - 99 mg/dL  BASIC METABOLIC PANEL     Status: Abnormal   Collection Time   05/19/12  5:00 AM      Component Value Range   Sodium 142  135 - 145 mEq/L   Potassium 3.5  3.5 - 5.1 mEq/L   Chloride 104  96 - 112 mEq/L   CO2 26  19 - 32 mEq/L   Glucose, Bld 103 (*) 70 - 99 mg/dL   BUN <3 (*) 6 - 23 mg/dL   Creatinine, Ser 5.62 (*) 0.50 - 1.10 mg/dL   Calcium 9.2  8.4 - 13.0 mg/dL   GFR calc non Af Amer >90  >90 mL/min   GFR calc Af Amer >90  >90 mL/min  CBC     Status: Abnormal   Collection Time   05/19/12  5:00 AM      Component  Value Range   WBC 10.9 (*) 4.0 - 10.5 K/uL   RBC 4.13  3.87 - 5.11 MIL/uL   Hemoglobin 10.1 (*) 12.0 - 15.0 g/dL   HCT 86.5 (*) 78.4 - 69.6 %   MCV 78.5  78.0 - 100.0 fL   MCH 24.5 (*) 26.0 - 34.0 pg   MCHC 31.2  30.0 - 36.0 g/dL   RDW 29.5 (*) 28.4 - 13.2 %   Platelets 422 (*) 150 - 400 K/uL  GLUCOSE, CAPILLARY     Status: Abnormal   Collection Time   05/19/12  8:10 AM      Component Value Range   Glucose-Capillary 120 (*) 70 - 99 mg/dL     Studies/Results Radiology     MEDS, Scheduled    . albuterol  2.5 mg Nebulization TID  . enoxaparin (LOVENOX) injection  40 mg Subcutaneous Daily  . insulin aspart  0-9 Units Subcutaneous Q4H  . piperacillin-tazobactam (ZOSYN)  IV  3.375 g Intravenous Q8H  . potassium chloride  40 mEq  Oral Once  . vancomycin  750 mg Intravenous Q8H     Assessment: HCAP (healthcare-associated pneumonia) UTI   Plan: PO abx today If pt still with min JP output tom, can d/c drain prior to d/c home.  I doubt she had a true GB fossa abscess.  LOS: 3 days    Vanita Panda, MD [facility] Surgery, [place] [phone]   05/19/2012 12:54 PM

## 2012-05-19 NOTE — Discharge Summary (Addendum)
Physician Discharge Summary  Briana Kelley ZOX:[id number] DOB: 07-21-1960 DOA: 05/16/2012  PCP: Cala Bradford, MD  Admit date: 05/16/2012 Discharge date: 05/19/2012  Recommendations for Outpatient Follow-up:  1. Pt will need to follow up with PCP in 2 weeks post discharge 2. Follow up with General surgery, Dr. Romie Levee 06/01/12  Discharge Diagnoses:  Sepsis  -Secondary to GB fossa abscess vs HCAP  -Status post percutaneous drainage gallbladder fossa abscess 05/17/12  -Blood cultures x2 sets--negative to date  -GB culture likely to be alpha hemolytic viridans/microaerophilic streptococcus-->plan to send home with Levaquin and Flagyl which will also cover her UTI and PNA -plan 11 more days abx at home (14 day total course) - Appreciate surgery followup--as noted for IR drainage (90cc purulent material) -She has followup appointment with Dr. Romie Levee on 06/01/2012 at 1025AM -d/c cipro, zosyn, and vanco -Dr. Maisie Fus noted drain can be d/c'ed prior to discharge-->spoke with IR and they will see pt 05/20/12 for possible d/c of drain -await final culture data from GB before d/c UTI--E.coli  -plan d/c with po abx  Fever/leukocytosis  -improved  -WBC actually trending down from the time of discharge on 05/15/2012  -Continue empiric antibiotics, follow cultures  -culture from gallbladder drainage--alpha hemolytic strep Tachycardia  -Likely physiologic the acute infectious process  -Continue IV fluids  Hypokalemia  -Replete  -Check magnesium--1.6  Diabetes mellitus type 2  -Check hemoglobin A1c--7.1  -Continue NovoLog sliding scale  Leg edema  -Venous Doppler lower extremities negative for DVT  Antibiotics:  Ciprofloxacin December 15>>>05/18/12  Vancomycin December 15>>> 05/19/12 Zosyn December 15>>>05/19/12 Levaquin/Flagyl December 17>>> Family Communication: Pt at beside  Disposition Plan: Home when medically stable   Discharge Condition: Stable  Disposition:  D/C home  Diet: 1800 calorie ADA Wt Readings from Last 3 Encounters:  05/17/12 100.5 kg (221 lb 9 oz)  05/14/12 102.059 kg (225 lb)  05/14/12 102.059 kg (225 lb)    History of present illness:  51 year old female with a history of cholecystitis status post laparoscopic cholecystectomy on 05/13/2012 presented with three-day history of fever, cough, and shortness of breath. The patient also stated that she has had nausea and vomiting for one day prior to admission. She has had decreased oral intake since the surgery on 05/13/2012. Acute abdominal x-ray in the emergency department revealed atelectasis versus infiltrate in the left base and right upper lobe. The patient was admitted to the hospital for concerns of healthcare associated pneumonia as well as intra-abdominal infection.  Hospital Course:  The patient was empirically started on Cipro, vancomycin, and Zosyn to cover for healthcare associated pneumonia. Blood cultures were obtained and are negative to date on the day of discharge. Urinalysis did suggest UTI. Urine culture grew Escherichia coli that was pansensitive. CT of the abdomen and pelvis was obtained and showed bibasilar atelectasis versus infiltrate in addition to a gas and fluid collection in the gallbladder fossa. There was fatty infiltration of the right upper quadrant fat. General surgery was consulted to see the patient. They recommended interventional radiology to place a percutaneous drain in the gallbladder fossa. Dr. Malachy Moan is a percutaneous drain in the right upper quadrant, and it drained 90 cc of purulent fluid. The drain was left in place to gravity. Cultures were obtained from the abscess and grew viridans streptococcus suggestive of microaerophilic strep. The patient's antibiotics were gradually de-escalated. Her vancomycin and Cipro were discontinued. Her Zosyn was subsequently switched to Levaquin and Flagyl. The patient will take antibiotics for 11 more days  at  home to make a 14 day course total. Dr. Romie Levee followed the patient and stated the GB drain could be removed prior to patient's discharge.  This was arranged with IR.  The patient's diet was advanced to a carbohydrate modified. The patient tolerated her diet without difficulty. The patient initially complained of some edema and pain in her legs. Venous ultrasound of her lower extremities were negative for DVT. The patient's cough and shortness of breath gradually improved with antibiotics. She did not require any supplement oxygen.  Regarding the patient's diabetes, the patient was taken off of her usual oral hypoglycemics and started on a NovoLog sliding scale. Her sugars were controlled. Hemoglobin C was 7.1. She will be discharged home back on her oral Amaryl and Kombiglyze. The patient was restarted on her home doses of Elavil, thyroid medication, and verapamil SR. The patient has a followup appointment with Dr. Romie Levee on 06/01/2012 at 10:25 AM. At that time, she will determine whether the patient will need the percutaneous drain removed.  Consultants: IR, Dr. Lemmie Evens CCS, Dr. Romie Levee  Discharge Exam: Filed Vitals:   05/19/12 0921  BP: 148/68  Pulse: 80  Temp: 98 F (36.7 C)  Resp: 18   Filed Vitals:   05/18/12 1446 05/18/12 2203 05/19/12 0613 05/19/12 0921  BP: 142/74 136/80 142/92 148/68  Pulse: 106 105 83 80  Temp: 98.1 F (36.7 C) 99.2 F (37.3 C) 97.8 F (36.6 C) 98 F (36.7 C)  TempSrc: Oral Oral Oral Oral  Resp: 18 16 16 18   Height:      Weight:      SpO2: 94% 91% 94%    General: A&O x 3, NAD, pleasant, cooperative Cardiovascular: RRR, no rub, no gallop, no S3 Respiratory: CTAB, no wheeze, no rhonchi Abdomen:soft, nontender, nondistended, positive bowel sounds Extremities: trace  edema, No lymphangitis, no petechiae  Discharge Instructions  Discharge Orders    Future Appointments: Provider: Department: Dept Phone: Center:   06/01/2012  10:25 AM Romie Levee, MD [facility] [phone] None       Medication List     As of 05/19/2012 12:47 PM    ASK your doctor about these medications         amitriptyline 25 MG tablet   Commonly known as: ELAVIL   Take 50 mg by mouth at bedtime.      thyroid 120 MG tablet   Commonly known as: ARMOUR   Take 120 mg by mouth See admin instructions. Take 1 tablet Mon- Fri      ARMOUR THYROID 90 MG tablet   Generic drug: thyroid   Take 90 mg by mouth 2 (two) times a week. On Saturday and Sunday      atorvastatin 10 MG tablet   Commonly known as: LIPITOR   Take 10 mg by mouth daily.      butalbital-acetaminophen-caffeine 50-325-40-30 MG per capsule   Commonly known as: FIORICET WITH CODEINE   Take 1 capsule by mouth every 4 (four) hours as needed. For headache      cholecalciferol 1000 UNITS tablet   Commonly known as: VITAMIN D   Take 1,000 Units by mouth daily.      FLAX SEED OIL PO   Take 1 tablet by mouth daily.      glimepiride 2 MG tablet   Commonly known as: AMARYL   Take 2 mg by mouth daily before breakfast.      ibuprofen 600 MG tablet  Commonly known as: ADVIL,MOTRIN   Take 1 tablet (600 mg total) by mouth 3 (three) times daily as needed for pain.      KOMBIGLYZE XR 2.10-998 MG Tb24   Generic drug: Saxagliptin-Metformin   Take 2 tablets by mouth daily.      lisinopril 5 MG tablet   Commonly known as: PRINIVIL,ZESTRIL   Take 5 mg by mouth daily.      naratriptan 2.5 MG tablet   Commonly known as: AMERGE   Take 2.5 mg by mouth daily as needed. Take one (1) tablet at onset of headache; if returns or does not resolve, may repeat after 4 hours; do not exceed five (5) mg in 24 hours.      omeprazole 40 MG capsule   Commonly known as: PRILOSEC   Take 40 mg by mouth daily.      verapamil 240 MG CR tablet   Commonly known as: CALAN-SR   Take 240 mg by mouth daily.         The results of significant diagnostics from this  hospitalization (including imaging, microbiology, ancillary and laboratory) are listed below for reference.    Significant Diagnostic Studies: Dg Chest 2 View  05/04/2012  *RADIOLOGY REPORT*  Clinical Data: Cough.  Fever.  Congestion.  Diabetes.  Preoperative for gallbladder surgery.  CHEST - 2 VIEW  Comparison: /11/13  Findings: Low lung volumes are present, causing crowding of the pulmonary vasculature.  Airway thickening may reflect bronchitis or reactive airways disease.  No airspace opacity is identified to suggest bacterial pneumonia pattern.  No airspace opacity observed. No pleural effusion identified. Cardiac and mediastinal contours appear unremarkable.  IMPRESSION:  1. Airway thickening may reflect bronchitis or reactive airways disease.  No airspace opacity is identified to suggest bacterial pneumonia pattern. 2.  Low lung volumes.   Original Report Authenticated By: Gaylyn Rong, M.D.    Dg Cholangiogram Operative  05/13/2012  *RADIOLOGY REPORT*  Clinical Data:   Cholelithiasis  INTRAOPERATIVE CHOLANGIOGRAM  Technique:  Cholangiographic images from the C-arm fluoroscopic device were submitted for interpretation post-operatively.  Please see the procedural report for the amount of contrast and the fluoroscopy time utilized.  Comparison:  None  Findings:  No persistent filling defects in the common duct. Intrahepatic ducts are incompletely visualized, appearing decompressed centrally. Contrast passes into the duodenum.  IMPRESSION  Negative for retained common duct stone.   Original Report Authenticated By: D. Andria Rhein, MD    Ct Guided Abscess Drain  05/17/2012  *RADIOLOGY REPORT*  CT GUIDED ABSCESS DRAIN  Date: 05/17/2012  Clinical History: 51 year old female status post laparoscopic cholecystectomy on 05/13/2012.  She has had continued postoperative pain, fever and chills. CT scan last night confirms a fluid and gas collection in the gallbladder fossa concerning for abscess.  She  presents for CT-guided abscess drain placement.  Procedures Performed: 1. CT guided placement of a 12-French all-purpose drainage catheter in the gallbladder fossa  Interventional Radiologist:  Sterling Big, MD  Sedation: Moderate (conscious) sedation was used. Two mg Versed, 100 mcg Fentanyl were administered intravenously.  The patient's vital signs were monitored continuously by radiology nursing throughout the procedure.  Sedation Time: 45 minutes  PROCEDURE/FINDINGS:   Informed consent was obtained from the patient following explanation of the procedure, risks, benefits and alternatives. The patient understands, agrees and consents for the procedure. All questions were addressed. A time out was performed.  Maximal barrier sterile technique utilized including caps, mask, sterile gowns, sterile gloves, large sterile drape,  hand hygiene, and betadine skin prep.  A planning axial CT scan was performed.  The gas and fluid collection in the gallbladder fossa was successfully identified. There is a narrow window cephalad to the hepatic flexure of the colon.  An appropriate skin entry site was selected and marked. Local anesthesia was achieved by infiltration of 1% lidocaine. Under direct CT fluoroscopic guidance a 20 cm 18 gauge trocar needle was carefully advanced through the anterior abdominal wall over the hepatic flexure and into the sub hepatic fluid collection. Axial CT imaging with sagittal reformats were used to confirm the needle trajectory and location.  Aspiration of purulent red brown material also confirmed needle tip location within the abscess.  A 0.035 inches wire was then coiled within the abscess cavity and the tract serially dilated to 12-French.  A 12 [place] all- purpose drainage catheter was then advanced over the wire and formed within the abscess collection.  A total of 90 ml of red/brown fluid was successfully aspirated and sent for culture. The drain was secured in position with O  Prolene suture and a plastic bumper as well as with an adhesive fixation device.  Post aspiration CT imaging confirms near total resolution of the abscess collection.  The patient tolerated the procedure well, there is no immediate complication.  IMPRESSION:  Successful placement of a 12-French drainage catheter in the gallbladder fossa abscess.  A total of 90 ml of purulent reddish brown fluid was aspirated and sent for culture. Post aspiration imaging confirms near total resolution of the abscess collection.  Drain management:  Recommend maintaining drains to bulb suction until systemic symptoms have resolved and drain output is minimal. Consider follow-up CT scan prior to tube removal.  Signed,  Sterling Big, MD Vascular & Interventional Radiologist [facility]   Original Report Authenticated By: Malachy Moan, M.D.    Ct Abdomen Pelvis W Contrast  05/17/2012  *RADIOLOGY REPORT*  Clinical Data: Right upper quadrant pain.  Cough.  CT ABDOMEN AND PELVIS WITH CONTRAST  Technique:  Multidetector CT imaging of the abdomen and pelvis was performed following the standard protocol during bolus administration of intravenous contrast.  Contrast: OMNIPAQUE IOHEXOL 300 MG/ML  SOLN  Comparison: 03/13/2012  Findings: Atelectasis or infiltration in both lung bases.  Diffuse low attenuation change throughout the liver consistent with fatty infiltration.  Since the previous study, there has been interval cholecystectomy.  There is gas and fluid in the gallbladder fossa with infiltration in the right upper quadrant fat.  Changes may represent postoperative collection although abscess in the gallbladder fossa is suspected.  No biliary gas or bile duct dilatation.  The pancreas, spleen, adrenal glands, kidneys, and retroperitoneal lymph nodes are unremarkable.  Calcification of the abdominal aorta without aneurysm.  The stomach, small bowel, and colon are not abnormally distended.  There is infiltration  in the anterior abdominal wall in the midline suggesting postoperative change. Small ventral abdominal wall hernia at the site of surgery containing fat.  Pelvis:  The uterus appears surgically absent.  No abnormal adnexal masses.  Bladder wall is not thickened.  No free or loculated pelvic fluid collections.  The appendix is normal.  No diverticulitis.  Normal alignment of the lumbar vertebrae.  IMPRESSION: Interval resection of the gallbladder with gas, fluid, and infiltrative changes in the gallbladder fossa worrisome for abscess.  Probable postoperative hernia containing fat at the midline.  Diffuse fatty infiltration of the liver.   Original Report Authenticated By: Burman Nieves, M.D.  Dg Chest Port 1 View  05/14/2012  *RADIOLOGY REPORT*  Clinical Data: Cough, oxygen desaturation, vomiting, recent cholecystectomy.  PORTABLE CHEST - 1 VIEW  Comparison: Chest x-ray of 05/04/2012  Findings: The lungs are not as well aerated with mild basilar atelectasis.  The heart is within upper limits of normal.  No bony abnormality is seen.  IMPRESSION: Diminished aeration with mild basilar atelectasis.   Original Report Authenticated By: Dwyane Dee, M.D.    Dg Abd Acute W/chest  05/16/2012  *RADIOLOGY REPORT*  Clinical Data: Right upper quadrant pain.  Cough and fever.  ACUTE ABDOMEN SERIES (ABDOMEN 2 VIEW & CHEST 1 VIEW)  Comparison: Chest 05/14/2012.  CT abdomen pelvis 03/13/2012  Findings: Shallow inspiration.  Heart size and pulmonary vascularity are normal.  There is linear atelectasis or infiltration in the left lung base and right upper lung.  This is progressing since the previous study.  Gas and stool throughout the abdomen.  No small or large bowel distension.  Surgical clips in the right upper quadrant.  No free air.  No abnormal air fluid levels.  No radiopaque stones. Visualized bones appear intact.  IMPRESSION: Shallow inspiration with increasing linear atelectasis or infiltration in the left lung  base and right upper lung. Nonobstructive bowel gas pattern.   Original Report Authenticated By: Burman Nieves, M.D.      Microbiology: Recent Results (from the past 240 hour(s))  URINE CULTURE     Status: Normal   Collection Time   05/16/12 10:45 PM      Component Value Range Status Comment   Specimen Description URINE, CLEAN CATCH   Final    Special Requests ADDED 2311   Final    Culture  Setup Time 05/17/2012 01:36   Final    Colony Count 90,000 COLONIES/ML   Final    Culture ESCHERICHIA COLI   Final    Report Status 05/18/2012 FINAL   Final    Organism ID, Bacteria ESCHERICHIA COLI   Final   CULTURE, BLOOD (ROUTINE X 2)     Status: Normal (Preliminary result)   Collection Time   05/17/12  9:16 AM      Component Value Range Status Comment   Specimen Description BLOOD RIGHT ARM   Final    Special Requests BOTTLES DRAWN AEROBIC AND ANAEROBIC 10CC   Final    Culture  Setup Time 05/17/2012 16:06   Final    Culture     Final    Value:        BLOOD CULTURE RECEIVED NO GROWTH TO DATE CULTURE WILL BE HELD FOR 5 DAYS BEFORE ISSUING A FINAL NEGATIVE REPORT   Report Status PENDING   Incomplete   CULTURE, BLOOD (ROUTINE X 2)     Status: Normal (Preliminary result)   Collection Time   05/17/12  9:25 AM      Component Value Range Status Comment   Specimen Description BLOOD RIGHT HAND   Final    Special Requests BOTTLES DRAWN AEROBIC AND ANAEROBIC 10CC   Final    Culture  Setup Time 05/17/2012 16:06   Final    Culture     Final    Value:        BLOOD CULTURE RECEIVED NO GROWTH TO DATE CULTURE WILL BE HELD FOR 5 DAYS BEFORE ISSUING A FINAL NEGATIVE REPORT   Report Status PENDING   Incomplete   CULTURE, ROUTINE-ABSCESS     Status: Normal (Preliminary result)   Collection Time   05/17/12  3:33  PM      Component Value Range Status Comment   Specimen Description GALL BLADDER   Final    Special Requests NONE   Final    Gram Stain PENDING   Incomplete    Culture ABUNDANT STREPTOCOCCUS  SPECIES   Final    Report Status PENDING   Incomplete   ANAEROBIC CULTURE     Status: Normal (Preliminary result)   Collection Time   05/17/12  3:33 PM      Component Value Range Status Comment   Specimen Description GALL BLADDER   Final    Special Requests NONE   Final    Gram Stain     Final    Value: ABUNDANT WBC PRESENT, PREDOMINANTLY PMN     NO SQUAMOUS EPITHELIAL CELLS SEEN     RARE GRAM POSITIVE COCCI IN PAIRS   Culture     Final    Value: NO ANAEROBES ISOLATED; CULTURE IN PROGRESS FOR 5 DAYS   Report Status PENDING   Incomplete      Labs: Basic Metabolic Panel:  Lab 05/19/12 1610 05/18/12 0610 05/17/12 0914 05/16/12 2218 05/15/12 0943  NA 142 137 139 142 138  K 3.5 3.0* -- -- --  CL 104 101 103 106 101  CO2 26 23 23 22 25   GLUCOSE 103* 109* 78 83 161*  BUN <3* <3* 4* 7 10  CREATININE 0.46* 0.43* 0.43* 0.46* 0.47*  CALCIUM 9.2 9.0 8.5 9.3 8.9  MG -- 1.6 -- -- --  PHOS -- -- -- -- --   Liver Function Tests:  Lab 05/16/12 2218 05/15/12 0943  AST 16 23  ALT 18 27  ALKPHOS 85 84  BILITOT 0.4 0.6  PROT 6.8 6.6  ALBUMIN 2.6* 2.8*    Lab 05/16/12 2218  LIPASE 26  AMYLASE --   No results found for this basename: AMMONIA:5 in the last 168 hours CBC:  Lab 05/19/12 0500 05/18/12 0610 05/17/12 0914 05/16/12 2218 05/15/12 0943  WBC 10.9* 9.8 11.6* 14.2* 19.8*  NEUTROABS -- -- -- 9.5* --  HGB 10.1* 9.7* 9.8* 10.8* 10.5*  HCT 32.4* 31.6* 31.5* 34.2* 33.2*  MCV 78.5 78.4 78.0 77.0* 77.0*  PLT 422* 376 349 368 349   Cardiac Enzymes: No results found for this basename: CKTOTAL:5,CKMB:5,CKMBINDEX:5,TROPONINI:5 in the last 168 hours BNP: No components found with this basename: POCBNP:5 CBG:  Lab 05/19/12 0810 05/19/12 0407 05/19/12 0016 05/18/12 2202 05/18/12 1644  GLUCAP 120* 106* 133* 156* 147*    Time coordinating discharge:  Greater than 30 minutes  Signed:  Makai Dumond, DO [facility] Pager: [phone] 05/19/2012, 12:47 PM

## 2012-05-19 NOTE — Progress Notes (Signed)
I was notified by the nurse afternoon patient was able to tolerate her oral Levaquin. However, the patient was not able to tolerate the Flagyl po. The nurse advised patient to take Flagyl with food due to its metallic/bitter taste. The patient elected not to take the Flagyl with food.  Almost immediately after ingesting the Flagyl, the patient vomited. The vomitus also included the patient's Levaquin tablet. The patient's Levaquin and Flagyl will be converted to IV for now with the intention of converting to oral formulation prior to discharge the patient remains clinically stable. Patient's vitals remained stable.

## 2012-05-19 NOTE — Progress Notes (Signed)
Subjective: GB fossa drain placed 12/15 Feels some better  Objective: Vital signs in last 24 hours: Temp:  [97.8 F (36.6 C)-99.2 F (37.3 C)] 97.8 F (36.6 C) (12/17 0613) Pulse Rate:  [83-106] 83  (12/17 0613) Resp:  [16-18] 16  (12/17 0613) BP: (136-142)/(74-92) 142/92 mmHg (12/17 0613) SpO2:  [91 %-94 %] 94 % (12/17 0613) Last BM Date: 05/18/12  Intake/Output from previous day: 12/16 0701 - 12/17 0700 In: 1320 [P.O.:120; I.V.:1200] Out: 10 [Drains:10] Intake/Output this shift:    PE: afeb; vss Wbc 10.9 GB fossa drain intact output 10 cc 12/16 Bloody No growth Site clean and dry; sl tender  Lab Results:   Basename 05/19/12 0500 05/18/12 0610  WBC 10.9* 9.8  HGB 10.1* 9.7*  HCT 32.4* 31.6*  PLT 422* 376   BMET  Basename 05/19/12 0500 05/18/12 0610  NA 142 137  K 3.5 3.0*  CL 104 101  CO2 26 23  GLUCOSE 103* 109*  BUN <3* <3*  CREATININE 0.46* 0.43*  CALCIUM 9.2 9.0   PT/INR  Basename 05/17/12 1300  LABPROT 15.1  INR 1.21   ABG No results found for this basename: PHART:2,PCO2:2,PO2:2,HCO3:2 in the last 72 hours  Studies/Results: Ct Guided Abscess Drain  05/17/2012  *RADIOLOGY REPORT*  CT GUIDED ABSCESS DRAIN  Date: 05/17/2012  Clinical History: 51 year old female status post laparoscopic cholecystectomy on 05/13/2012.  She has had continued postoperative pain, fever and chills. CT scan last night confirms a fluid and gas collection in the gallbladder fossa concerning for abscess.  She presents for CT-guided abscess drain placement.  Procedures Performed: 1. CT guided placement of a 12-French all-purpose drainage catheter in the gallbladder fossa  Interventional Radiologist:  Sterling Big, MD  Sedation: Moderate (conscious) sedation was used. Two mg Versed, 100 mcg Fentanyl were administered intravenously.  The patient's vital signs were monitored continuously by radiology nursing throughout the procedure.  Sedation Time: 45 minutes   PROCEDURE/FINDINGS:   Informed consent was obtained from the patient following explanation of the procedure, risks, benefits and alternatives. The patient understands, agrees and consents for the procedure. All questions were addressed. A time out was performed.  Maximal barrier sterile technique utilized including caps, mask, sterile gowns, sterile gloves, large sterile drape, hand hygiene, and betadine skin prep.  A planning axial CT scan was performed.  The gas and fluid collection in the gallbladder fossa was successfully identified. There is a narrow window cephalad to the hepatic flexure of the colon.  An appropriate skin entry site was selected and marked. Local anesthesia was achieved by infiltration of 1% lidocaine. Under direct CT fluoroscopic guidance a 20 cm 18 gauge trocar needle was carefully advanced through the anterior abdominal wall over the hepatic flexure and into the sub hepatic fluid collection. Axial CT imaging with sagittal reformats were used to confirm the needle trajectory and location.  Aspiration of purulent red brown material also confirmed needle tip location within the abscess.  A 0.035 inches wire was then coiled within the abscess cavity and the tract serially dilated to 12-French.  A 12 [place] Cook all- purpose drainage catheter was then advanced over the wire and formed within the abscess collection.  A total of 90 ml of red/brown fluid was successfully aspirated and sent for culture. The drain was secured in position with O Prolene suture and a plastic bumper as well as with an adhesive fixation device.  Post aspiration CT imaging confirms near total resolution of the abscess collection.  The patient  tolerated the procedure well, there is no immediate complication.  IMPRESSION:  Successful placement of a 12-French drainage catheter in the gallbladder fossa abscess.  A total of 90 ml of purulent reddish brown fluid was aspirated and sent for culture. Post aspiration imaging  confirms near total resolution of the abscess collection.  Drain management:  Recommend maintaining drains to bulb suction until systemic symptoms have resolved and drain output is minimal. Consider follow-up CT scan prior to tube removal.  Signed,  Sterling Big, MD Vascular & Interventional Radiologist [facility]   Original Report Authenticated By: Malachy Moan, M.D.     Anti-infectives:   Assessment/Plan: s/p * No surgery found *  GB fossa drain placed 12/15 Doing well Will follow Plan per [facility]   LOS: 3 days    Toma Arts A 05/19/2012

## 2012-05-20 DIAGNOSIS — K819 Cholecystitis, unspecified: Secondary | ICD-10-CM

## 2012-05-20 LAB — BASIC METABOLIC PANEL
BUN: <3 mg/dL — ABNORMAL LOW (ref 6–23)
CO2: 24 mEq/L (ref 19–32)
Calcium: 9.1 mg/dL (ref 8.4–10.5)
Chloride: 105 mEq/L (ref 96–112)
Creatinine, Ser: 0.7 mg/dL (ref 0.50–1.10)
GFR calc Af Amer: >90 mL/min (ref >90)
GFR calc non Af Amer: >90 mL/min (ref >90)
Glucose, Bld: 124 mg/dL — ABNORMAL HIGH (ref 70–99)
Potassium: 3.4 mEq/L — ABNORMAL LOW (ref 3.5–5.1)
Sodium: 140 mEq/L (ref 135–145)

## 2012-05-20 LAB — CULTURE, ROUTINE-ABSCESS

## 2012-05-20 LAB — CBC
HCT: 32.1 % — ABNORMAL LOW (ref 36.0–46.0)
Hemoglobin: 10.1 g/dL — ABNORMAL LOW (ref 12.0–15.0)
MCH: 24.3 pg — ABNORMAL LOW (ref 26.0–34.0)
MCHC: 31.5 g/dL (ref 30.0–36.0)
MCV: 77.2 fL — ABNORMAL LOW (ref 78.0–100.0)
Platelets: 441 10*3/uL — ABNORMAL HIGH (ref 150–400)
RBC: 4.16 MIL/uL (ref 3.87–5.11)
RDW: 17 % — ABNORMAL HIGH (ref 11.5–15.5)
WBC: 13.3 10*3/uL — ABNORMAL HIGH (ref 4.0–10.5)

## 2012-05-20 LAB — GLUCOSE, CAPILLARY
Glucose-Capillary: 117 mg/dL — ABNORMAL HIGH (ref 70–99)
Glucose-Capillary: 140 mg/dL — ABNORMAL HIGH (ref 70–99)
Glucose-Capillary: 129 mg/dL — ABNORMAL HIGH (ref 70–99)
Glucose-Capillary: 128 mg/dL — ABNORMAL HIGH (ref 70–99)

## 2012-05-20 MED ORDER — AMOXICILLIN-POT CLAVULANATE 875-125 MG PO TABS
1.0000 | ORAL_TABLET | Freq: Two times a day (BID) | ORAL | Status: DC
  Administered 2012-05-20 – 2012-05-21 (×3): 1 via ORAL
  Filled 2012-05-20 (×5): qty 1

## 2012-05-20 NOTE — Progress Notes (Signed)
Patient ID: Briana Kelley  female  JYN:[id number]    DOB: 1960/06/30    DOA: 05/16/2012  PCP: Cala Bradford, MD  Assessment/Plan:  Sepsis: Secondary to GB fossa abscess vs HCAP, resolved  -Status post percutaneous drainage gallbladder fossa abscess 05/17/12  -Blood cultures x2 sets--negative to date  -GB culture show microaerophilic streptococci: She did not tolerate on Flagyl yesterday and Levaquin. She declines to be on Flagyl for DC, will likely need 10 days more for 14 day course.  Discussed with ID, Dr Daiva Eves, recommended changing to Augmentin based on urine and GB culture results for at least 10 more days after DC  -She has followup appointment with Dr. Romie Levee on 06/01/2012 at 10:25AM  -Dr. Maisie Fus noted drain can be d/c'ed prior to discharge, awaiting IR to dc drain    UTI--E.coli  -plan d/c with po abx   Fever/leukocytosis; afebrile, WBC trending up again  - Currently on IV Levaquin and Flagyl however patient did not tolerate oral Flagyl. I will change her to PO Augmentin -culture from gallbladder drainage   Tachycardia  -Likely physiologic the acute infectious process, Continue IV fluids and IV antibiotics  Hypokalemia: replaced   Diabetes mellitus type 2  - Check hemoglobin A1c--7.1  - Continue NovoLog sliding scale   Leg edema  -Venous Doppler lower extremities negative for DVT    DVT Prophylaxis:  Code Status:  Disposition: Will make sure that she is able to tolerate the oral antibiotics and drain is removed.   Subjective: Patient was able to tolerate diet however did not want to be on oral Flagyl, no abdominal pain, fever, chest pain, vomiting   Objective: Weight change:   Intake/Output Summary (Last 24 hours) at 05/20/12 1314 Last data filed at 05/20/12 0700  Gross per 24 hour  Intake   2827 ml  Output      0 ml  Net   2827 ml   Blood pressure 132/77, pulse 105, temperature 98.3 F (36.8 C), temperature source Oral, resp. rate 18, height  5\' 2"  (1.575 m), weight 100.5 kg (221 lb 9 oz), SpO2 96.00%.  Physical Exam: General: Alert and awake, oriented x3, not in any acute distress. HEENT: anicteric sclera, pupils reactive to light and accommodation, EOMI CVS: S1-S2 clear, no murmur rubs or gallops Chest: clear to auscultation bilaterally, no wheezing, rales or rhonchi Abdomen: soft, drain +  Extremities: no cyanosis, clubbing or edema noted bilaterally Neuro: Cranial nerves II-XII intact, no focal neurological deficits  Lab Results: Basic Metabolic Panel:  Lab 05/20/12 8657 05/19/12 0500 05/18/12 0610  NA 140 142 --  K 3.4* 3.5 --  CL 105 104 --  CO2 24 26 --  GLUCOSE 124* 103* --  BUN <3* <3* --  CREATININE 0.70 0.46* --  CALCIUM 9.1 9.2 --  MG -- -- 1.6  PHOS -- -- --   Liver Function Tests:  Lab 05/16/12 2218 05/15/12 0943  AST 16 23  ALT 18 27  ALKPHOS 85 84  BILITOT 0.4 0.6  PROT 6.8 6.6  ALBUMIN 2.6* 2.8*    Lab 05/16/12 2218  LIPASE 26  AMYLASE --   No results found for this basename: AMMONIA:2 in the last 168 hours CBC:  Lab 05/20/12 0500 05/19/12 0500 05/16/12 2218  WBC 13.3* 10.9* --  NEUTROABS -- -- 9.5*  HGB 10.1* 10.1* --  HCT 32.1* 32.4* --  MCV 77.2* 78.5 --  PLT 441* 422* --   CBG:  Lab 05/20/12 1142 05/20/12 8469  05/19/12 2150 05/19/12 1609 05/19/12 1123  GLUCAP 140* 117* 114* 168* 148*     Micro Results: Recent Results (from the past 240 hour(s))  URINE CULTURE     Status: Normal   Collection Time   05/16/12 10:45 PM      Component Value Range Status Comment   Specimen Description URINE, CLEAN CATCH   Final    Special Requests ADDED 2311   Final    Culture  Setup Time 05/17/2012 01:36   Final    Colony Count 90,000 COLONIES/ML   Final    Culture ESCHERICHIA COLI   Final    Report Status 05/18/2012 FINAL   Final    Organism ID, Bacteria ESCHERICHIA COLI   Final   CULTURE, BLOOD (ROUTINE X 2)     Status: Normal (Preliminary result)   Collection Time   05/17/12   9:16 AM      Component Value Range Status Comment   Specimen Description BLOOD RIGHT ARM   Final    Special Requests BOTTLES DRAWN AEROBIC AND ANAEROBIC 10CC   Final    Culture  Setup Time 05/17/2012 16:06   Final    Culture     Final    Value:        BLOOD CULTURE RECEIVED NO GROWTH TO DATE CULTURE WILL BE HELD FOR 5 DAYS BEFORE ISSUING A FINAL NEGATIVE REPORT   Report Status PENDING   Incomplete   CULTURE, BLOOD (ROUTINE X 2)     Status: Normal (Preliminary result)   Collection Time   05/17/12  9:25 AM      Component Value Range Status Comment   Specimen Description BLOOD RIGHT HAND   Final    Special Requests BOTTLES DRAWN AEROBIC AND ANAEROBIC 10CC   Final    Culture  Setup Time 05/17/2012 16:06   Final    Culture     Final    Value:        BLOOD CULTURE RECEIVED NO GROWTH TO DATE CULTURE WILL BE HELD FOR 5 DAYS BEFORE ISSUING A FINAL NEGATIVE REPORT   Report Status PENDING   Incomplete   CULTURE, ROUTINE-ABSCESS     Status: Normal   Collection Time   05/17/12  3:33 PM      Component Value Range Status Comment   Specimen Description GALL BLADDER   Final    Special Requests NONE   Final    Gram Stain     Final    Value: ABUNDANT WBC PRESENT, PREDOMINANTLY PMN     NO SQUAMOUS EPITHELIAL CELLS SEEN     RARE GRAM POSITIVE COCCI IN PAIRS   Culture     Final    Value: ABUNDANT MICROAEROPHILIC STREPTOCOCCI     Note: Standardized susceptibility testing for this organism is not available.   Report Status 05/20/2012 FINAL   Final   ANAEROBIC CULTURE     Status: Normal (Preliminary result)   Collection Time   05/17/12  3:33 PM      Component Value Range Status Comment   Specimen Description GALL BLADDER   Final    Special Requests NONE   Final    Gram Stain     Final    Value: ABUNDANT WBC PRESENT, PREDOMINANTLY PMN     NO SQUAMOUS EPITHELIAL CELLS SEEN     RARE GRAM POSITIVE COCCI IN PAIRS   Culture     Final    Value: NO ANAEROBES ISOLATED; CULTURE IN PROGRESS FOR 5 DAYS  Report Status PENDING   Incomplete     Studies/Results: Dg Chest 2 View  05/04/2012  *RADIOLOGY REPORT*  Clinical Data: Cough.  Fever.  Congestion.  Diabetes.  Preoperative for gallbladder surgery.  CHEST - 2 VIEW  Comparison: /11/13  Findings: Low lung volumes are present, causing crowding of the pulmonary vasculature.  Airway thickening may reflect bronchitis or reactive airways disease.  No airspace opacity is identified to suggest bacterial pneumonia pattern.  No airspace opacity observed. No pleural effusion identified. Cardiac and mediastinal contours appear unremarkable.  IMPRESSION:  1. Airway thickening may reflect bronchitis or reactive airways disease.  No airspace opacity is identified to suggest bacterial pneumonia pattern. 2.  Low lung volumes.   Original Report Authenticated By: Gaylyn Rong, M.D.    Dg Cholangiogram Operative  05/13/2012  *RADIOLOGY REPORT*  Clinical Data:   Cholelithiasis  INTRAOPERATIVE CHOLANGIOGRAM  Technique:  Cholangiographic images from the C-arm fluoroscopic device were submitted for interpretation post-operatively.  Please see the procedural report for the amount of contrast and the fluoroscopy time utilized.  Comparison:  None  Findings:  No persistent filling defects in the common duct. Intrahepatic ducts are incompletely visualized, appearing decompressed centrally. Contrast passes into the duodenum.  IMPRESSION  Negative for retained common duct stone.   Original Report Authenticated By: D. Andria Rhein, MD    Ct Guided Abscess Drain  05/17/2012  *RADIOLOGY REPORT*  CT GUIDED ABSCESS DRAIN  Date: 05/17/2012  Clinical History: 51 year old female status post laparoscopic cholecystectomy on 05/13/2012.  She has had continued postoperative pain, fever and chills. CT scan last night confirms a fluid and gas collection in the gallbladder fossa concerning for abscess.  She presents for CT-guided abscess drain placement.  Procedures Performed: 1. CT guided  placement of a 12-French all-purpose drainage catheter in the gallbladder fossa  Interventional Radiologist:  Sterling Big, MD  Sedation: Moderate (conscious) sedation was used. Two mg Versed, 100 mcg Fentanyl were administered intravenously.  The patient's vital signs were monitored continuously by radiology nursing throughout the procedure.  Sedation Time: 45 minutes  PROCEDURE/FINDINGS:   Informed consent was obtained from the patient following explanation of the procedure, risks, benefits and alternatives. The patient understands, agrees and consents for the procedure. All questions were addressed. A time out was performed.  Maximal barrier sterile technique utilized including caps, mask, sterile gowns, sterile gloves, large sterile drape, hand hygiene, and betadine skin prep.  A planning axial CT scan was performed.  The gas and fluid collection in the gallbladder fossa was successfully identified. There is a narrow window cephalad to the hepatic flexure of the colon.  An appropriate skin entry site was selected and marked. Local anesthesia was achieved by infiltration of 1% lidocaine. Under direct CT fluoroscopic guidance a 20 cm 18 gauge trocar needle was carefully advanced through the anterior abdominal wall over the hepatic flexure and into the sub hepatic fluid collection. Axial CT imaging with sagittal reformats were used to confirm the needle trajectory and location.  Aspiration of purulent red brown material also confirmed needle tip location within the abscess.  A 0.035 inches wire was then coiled within the abscess cavity and the tract serially dilated to 12-French.  A 12 [place] all- purpose drainage catheter was then advanced over the wire and formed within the abscess collection.  A total of 90 ml of red/brown fluid was successfully aspirated and sent for culture. The drain was secured in position with O Prolene suture and a plastic bumper as  well as with an adhesive fixation device.   Post aspiration CT imaging confirms near total resolution of the abscess collection.  The patient tolerated the procedure well, there is no immediate complication.  IMPRESSION:  Successful placement of a 12-French drainage catheter in the gallbladder fossa abscess.  A total of 90 ml of purulent reddish brown fluid was aspirated and sent for culture. Post aspiration imaging confirms near total resolution of the abscess collection.  Drain management:  Recommend maintaining drains to bulb suction until systemic symptoms have resolved and drain output is minimal. Consider follow-up CT scan prior to tube removal.  Signed,  Sterling Big, MD Vascular & Interventional Radiologist [facility]iology   Original Report Authenticated By: Malachy Moan, M.D.    Ct Abdomen Pelvis W Contrast  05/17/2012  *RADIOLOGY REPORT*  Clinical Data: Right upper quadrant pain.  Cough.  CT ABDOMEN AND PELVIS WITH CONTRAST  Technique:  Multidetector CT imaging of the abdomen and pelvis was performed following the standard protocol during bolus administration of intravenous contrast.  Contrast: OMNIPAQUE IOHEXOL 300 MG/ML  SOLN  Comparison: 03/13/2012  Findings: Atelectasis or infiltration in both lung bases.  Diffuse low attenuation change throughout the liver consistent with fatty infiltration.  Since the previous study, there has been interval cholecystectomy.  There is gas and fluid in the gallbladder fossa with infiltration in the right upper quadrant fat.  Changes may represent postoperative collection although abscess in the gallbladder fossa is suspected.  No biliary gas or bile duct dilatation.  The pancreas, spleen, adrenal glands, kidneys, and retroperitoneal lymph nodes are unremarkable.  Calcification of the abdominal aorta without aneurysm.  The stomach, small bowel, and colon are not abnormally distended.  There is infiltration in the anterior abdominal wall in the midline suggesting postoperative change.  Small ventral abdominal wall hernia at the site of surgery containing fat.  Pelvis:  The uterus appears surgically absent.  No abnormal adnexal masses.  Bladder wall is not thickened.  No free or loculated pelvic fluid collections.  The appendix is normal.  No diverticulitis.  Normal alignment of the lumbar vertebrae.  IMPRESSION: Interval resection of the gallbladder with gas, fluid, and infiltrative changes in the gallbladder fossa worrisome for abscess.  Probable postoperative hernia containing fat at the midline.  Diffuse fatty infiltration of the liver.   Original Report Authenticated By: Burman Nieves, M.D.    Dg Chest Port 1 View  05/14/2012  *RADIOLOGY REPORT*  Clinical Data: Cough, oxygen desaturation, vomiting, recent cholecystectomy.  PORTABLE CHEST - 1 VIEW  Comparison: Chest x-ray of 05/04/2012  Findings: The lungs are not as well aerated with mild basilar atelectasis.  The heart is within upper limits of normal.  No bony abnormality is seen.  IMPRESSION: Diminished aeration with mild basilar atelectasis.   Original Report Authenticated By: Dwyane Dee, M.D.    Dg Abd Acute W/chest  05/16/2012  *RADIOLOGY REPORT*  Clinical Data: Right upper quadrant pain.  Cough and fever.  ACUTE ABDOMEN SERIES (ABDOMEN 2 VIEW & CHEST 1 VIEW)  Comparison: Chest 05/14/2012.  CT abdomen pelvis 03/13/2012  Findings: Shallow inspiration.  Heart size and pulmonary vascularity are normal.  There is linear atelectasis or infiltration in the left lung base and right upper lung.  This is progressing since the previous study.  Gas and stool throughout the abdomen.  No small or large bowel distension.  Surgical clips in the right upper quadrant.  No free air.  No abnormal air fluid levels.  No  radiopaque stones. Visualized bones appear intact.  IMPRESSION: Shallow inspiration with increasing linear atelectasis or infiltration in the left lung base and right upper lung. Nonobstructive bowel gas pattern.   Original Report  Authenticated By: Burman Nieves, M.D.     Medications: Scheduled Meds:   . albuterol  2.5 mg Nebulization TID  . enoxaparin (LOVENOX) injection  40 mg Subcutaneous Daily  . insulin aspart  0-9 Units Subcutaneous Q4H  . levofloxacin (LEVAQUIN) IV  750 mg Intravenous Q24H  . metronidazole  500 mg Intravenous Q8H  . potassium chloride  40 mEq Oral Once      LOS: 4 days   Stefen Juba M.D. [facility] 05/20/2012, 1:14 PM Pager: [phone]  If 7PM-7AM, please contact night-coverage [email or web address] Password [email or web address]

## 2012-05-20 NOTE — Progress Notes (Signed)
Subjective: Pt feeling ok. No new c/o   Objective: Physical Exam: BP 132/77  Pulse 105  Temp 98.3 F (36.8 C) (Oral)  Resp 18  Ht 5\' 2"  (1.575 m)  Wt 221 lb 9 oz (100.5 kg)  BMI 40.52 kg/m2  SpO2 96% RUQ drain intact, site clean, NT Scant drainage, serosanguinous, no pus, no bile    Labs: CBC  Basename 05/20/12 0500 05/19/12 0500  WBC 13.3* 10.9*  HGB 10.1* 10.1*  HCT 32.1* 32.4*  PLT 441* 422*   BMET  Basename 05/20/12 0500 05/19/12 0500  NA 140 142  K 3.4* 3.5  CL 105 104  CO2 24 26  GLUCOSE 124* 103*  BUN <3* <3*  CREATININE 0.70 0.46*  CALCIUM 9.1 9.2   LFT No results found for this basename: PROT,ALBUMIN,AST,ALT,ALKPHOS,BILITOT,BILIDIR,IBILI,LIPASE in the last 72 hours PT/INR No results found for this basename: LABPROT:2,INR:2 in the last 72 hours   Studies/Results: No results found.  Assessment/Plan: GB fossa drain placed 12/15 Per [facility] request, drain removed today without difficulty. Call if needed.    LOS: 4 days    Brayton El PA-C 05/20/2012 1:40 PM

## 2012-05-21 LAB — GLUCOSE, CAPILLARY
Glucose-Capillary: 107 mg/dL — ABNORMAL HIGH (ref 70–99)
Glucose-Capillary: 109 mg/dL — ABNORMAL HIGH (ref 70–99)
Glucose-Capillary: 132 mg/dL — ABNORMAL HIGH (ref 70–99)
Glucose-Capillary: 137 mg/dL — ABNORMAL HIGH (ref 70–99)

## 2012-05-21 MED ORDER — PROMETHAZINE HCL 12.5 MG PO TABS: 12.5000 mg | ORAL_TABLET | Freq: Four times a day (QID) | ORAL | Status: AC | PRN

## 2012-05-21 MED ORDER — AMOXICILLIN-POT CLAVULANATE 875-125 MG PO TABS: 1.0000 | ORAL_TABLET | Freq: Two times a day (BID) | ORAL | Status: DC

## 2012-05-21 NOTE — Discharge Summary (Signed)
Physician Discharge Summary  Patient ID: ZAMYA CULHANE MRN: [id number] DOB/AGE: Jul 09, 1960 51 y.o.  Admit date: 05/16/2012 Discharge date: 05/21/2012  Primary Care Physician:  Cala Bradford, MD  Discharge Diagnoses:     Sepsis secondary to gallbladder fossa abscess versus HCAP  . HCAP (healthcare-associated pneumonia)   Gallbladder fossa abscess, post-operative, final cultures microaerophilic streptococci  . Hypokalemia . Leukocytosis . SOB (shortness of breath) secondary to PNA, resolved . Diabetes mellitus HbA1c 7.1 . Hyperlipidemia . Hypothyroid . Obesity . Migraines . Obstructive sleep apnea (adult)  . Ecoli UTI (lower urinary tract infection)  Consults: General surgery, Dr. Romie Levee                    Interventional radiology    Discharge Medications:   Medication List     As of 05/21/2012 10:17 AM    TAKE these medications         amitriptyline 25 MG tablet   Commonly known as: ELAVIL   Take 50 mg by mouth at bedtime.      amoxicillin-clavulanate 875-125 MG per tablet   Commonly known as: AUGMENTIN   Take 1 tablet by mouth every 12 (twelve) hours. X10 DAYS      thyroid 120 MG tablet   Commonly known as: ARMOUR   Take 120 mg by mouth See admin instructions. Take 1 tablet Mon- Fri      ARMOUR THYROID 90 MG tablet   Generic drug: thyroid   Take 90 mg by mouth 2 (two) times a week. On Saturday and Sunday      atorvastatin 10 MG tablet   Commonly known as: LIPITOR   Take 10 mg by mouth daily.      butalbital-acetaminophen-caffeine 50-325-40-30 MG per capsule   Commonly known as: FIORICET WITH CODEINE   Take 1 capsule by mouth every 4 (four) hours as needed. For headache      cholecalciferol 1000 UNITS tablet   Commonly known as: VITAMIN D   Take 1,000 Units by mouth daily.      FLAX SEED OIL PO   Take 1 tablet by mouth daily.      glimepiride 2 MG tablet   Commonly known as: AMARYL   Take 2 mg by mouth daily before breakfast.       ibuprofen 600 MG tablet   Commonly known as: ADVIL,MOTRIN   Take 1 tablet (600 mg total) by mouth 3 (three) times daily as needed for pain.      KOMBIGLYZE XR 2.10-998 MG Tb24   Generic drug: Saxagliptin-Metformin   Take 2 tablets by mouth daily.      lisinopril 5 MG tablet   Commonly known as: PRINIVIL,ZESTRIL   Take 5 mg by mouth daily.      naratriptan 2.5 MG tablet   Commonly known as: AMERGE   Take 2.5 mg by mouth daily as needed. Take one (1) tablet at onset of headache; if returns or does not resolve, may repeat after 4 hours; do not exceed five (5) mg in 24 hours.      omeprazole 40 MG capsule   Commonly known as: PRILOSEC   Take 40 mg by mouth daily.      promethazine 12.5 MG tablet   Commonly known as: PHENERGAN   Take 1 tablet (12.5 mg total) by mouth every 6 (six) hours as needed for nausea.      verapamil 240 MG CR tablet   Commonly known as: CALAN-SR   Take 240  mg by mouth daily.         Brief H and P: For complete details please refer to admission H and P, but in brief patient is a 51 year old female who presented to ED with complaints of 3 days of fevers, cough, worsening shortness of breath for one day it she also reported having nausea and vomiting and not being able to hold down much food following her surgery 4 days prior to admission. Patient had laparoscopic cholecystectomy done on 05/13/2012. The patient was admitted to the hospital for concerns of healthcare associated pneumonia as well as intra-abdominal infection.  Hospital Course:  The patient was empirically started on Cipro, vancomycin, and Zosyn to cover for healthcare associated pneumonia. Blood cultures were obtained and are negative to date on the day of discharge. Urine culture grew Escherichia coli that was pansensitive.  CT of the abdomen and pelvis was obtained and showed bibasilar atelectasis versus infiltrate in addition to a gas and fluid collection in the gallbladder fossa. There was fatty  infiltration of the right upper quadrant fat. General surgery was consulted to see the patient. They recommended interventional radiology to place a percutaneous drain in the gallbladder fossa. The drain was left in place to gravity and and was removed on 05/20/2012 by interventional radiology per the recommendation of surgery. Cultures were obtained from the abscess and grew microaerophilic streptococci. The patient's antibiotics were gradually de-escalated. Her vancomycin and Cipro were discontinued. Her Zosyn was subsequently switched to Levaquin and Flagyl. Patient was not able to tolerate oral Flagyl. After discussion with infectious disease,  Dr. Algis Liming, patient was transitioned to Augmentin which she was able to tolerate. She will continue Augmentin for 10 days. She has an appointment with Dr. Maisie Fus on 06/01/2012.  Day of Discharge BP 139/89  Pulse 93  Temp 98.7 F (37.1 C) (Oral)  Resp 16  Ht 5\' 2"  (1.575 m)  Wt 100.5 kg (221 lb 9 oz)  BMI 40.52 kg/m2  SpO2 93%  Physical Exam: General: Alert and awake oriented x3 not in any acute distress. HEENT: anicteric sclera, pupils reactive to light and accommodation CVS: S1-S2 clear no murmur rubs or gallops Chest: clear to auscultation bilaterally, no wheezing rales or rhonchi Abdomen: soft nontender, nondistended, normal bowel sounds,drain removed, dressing intact Extremities: no cyanosis, clubbing or edema noted bilaterally Neuro: Cranial nerves II-XII intact, no focal neurological deficits   The results of significant diagnostics from this hospitalization (including imaging, microbiology, ancillary and laboratory) are listed below for reference.    LAB RESULTS: Basic Metabolic Panel:  Lab 05/20/12 4696 05/19/12 0500 05/18/12 0610  NA 140 142 --  K 3.4* 3.5 --  CL 105 104 --  CO2 24 26 --  GLUCOSE 124* 103* --  BUN <3* <3* --  CREATININE 0.70 0.46* --  CALCIUM 9.1 9.2 --  MG -- -- 1.6  PHOS -- -- --   Liver Function  Tests:  Lab 05/16/12 2218 05/15/12 0943  AST 16 23  ALT 18 27  ALKPHOS 85 84  BILITOT 0.4 0.6  PROT 6.8 6.6  ALBUMIN 2.6* 2.8*    Lab 05/16/12 2218  LIPASE 26  AMYLASE --   No results found for this basename: AMMONIA:2 in the last 168 hours CBC:  Lab 05/20/12 0500 05/19/12 0500 05/16/12 2218  WBC 13.3* 10.9* --  NEUTROABS -- -- 9.5*  HGB 10.1* 10.1* --  HCT 32.1* 32.4* --  MCV 77.2* -- --  PLT 441* 422* --   Cardiac Enzymes:  No results found for this basename: CKTOTAL:2,CKMB:2,CKMBINDEX:2,TROPONINI:2 in the last 168 hours BNP: No components found with this basename: POCBNP:2 CBG:  Lab 05/21/12 0857 05/21/12 0409  GLUCAP 107* 109*    Significant Diagnostic Studies:  Ct Guided Abscess Drain  05/17/2012  *RADIOLOGY REPORT*  CT GUIDED ABSCESS DRAIN  Date: 05/17/2012  Clinical History: 51 year old female status post laparoscopic cholecystectomy on 05/13/2012.  She has had continued postoperative pain, fever and chills. CT scan last night confirms a fluid and gas collection in the gallbladder fossa concerning for abscess.  She presents for CT-guided abscess drain placement.  Procedures Performed: 1. CT guided placement of a 12-French all-purpose drainage catheter in the gallbladder fossa  Interventional Radiologist:  Sterling Big, MD  Sedation: Moderate (conscious) sedation was used. Two mg Versed, 100 mcg Fentanyl were administered intravenously.  The patient's vital signs were monitored continuously by radiology nursing throughout the procedure.  Sedation Time: 45 minutes  PROCEDURE/FINDINGS:   Informed consent was obtained from the patient following explanation of the procedure, risks, benefits and alternatives. The patient understands, agrees and consents for the procedure. All questions were addressed. A time out was performed.  Maximal barrier sterile technique utilized including caps, mask, sterile gowns, sterile gloves, large sterile drape, hand hygiene, and betadine  skin prep.  A planning axial CT scan was performed.  The gas and fluid collection in the gallbladder fossa was successfully identified. There is a narrow window cephalad to the hepatic flexure of the colon.  An appropriate skin entry site was selected and marked. Local anesthesia was achieved by infiltration of 1% lidocaine. Under direct CT fluoroscopic guidance a 20 cm 18 gauge trocar needle was carefully advanced through the anterior abdominal wall over the hepatic flexure and into the sub hepatic fluid collection. Axial CT imaging with sagittal reformats were used to confirm the needle trajectory and location.  Aspiration of purulent red brown material also confirmed needle tip location within the abscess.  A 0.035 inches wire was then coiled within the abscess cavity and the tract serially dilated to 12-French.  A 12 [place] all- purpose drainage catheter was then advanced over the wire and formed within the abscess collection.  A total of 90 ml of red/brown fluid was successfully aspirated and sent for culture. The drain was secured in position with O Prolene suture and a plastic bumper as well as with an adhesive fixation device.  Post aspiration CT imaging confirms near total resolution of the abscess collection.  The patient tolerated the procedure well, there is no immediate complication.  IMPRESSION:  Successful placement of a 12-French drainage catheter in the gallbladder fossa abscess.  A total of 90 ml of purulent reddish brown fluid was aspirated and sent for culture. Post aspiration imaging confirms near total resolution of the abscess collection.  Drain management:  Recommend maintaining drains to bulb suction until systemic symptoms have resolved and drain output is minimal. Consider follow-up CT scan prior to tube removal.  Signed,  Sterling Big, MD Vascular & Interventional Radiologist [facility]   Original Report Authenticated By: Malachy Moan, M.D.    Ct Abdomen Pelvis  W Contrast  05/17/2012  *RADIOLOGY REPORT*  Clinical Data: Right upper quadrant pain.  Cough.  CT ABDOMEN AND PELVIS WITH CONTRAST  Technique:  Multidetector CT imaging of the abdomen and pelvis was performed following the standard protocol during bolus administration of intravenous contrast.  Contrast: OMNIPAQUE IOHEXOL 300 MG/ML  SOLN  Comparison: 03/13/2012  Findings: Atelectasis  or infiltration in both lung bases.  Diffuse low attenuation change throughout the liver consistent with fatty infiltration.  Since the previous study, there has been interval cholecystectomy.  There is gas and fluid in the gallbladder fossa with infiltration in the right upper quadrant fat.  Changes may represent postoperative collection although abscess in the gallbladder fossa is suspected.  No biliary gas or bile duct dilatation.  The pancreas, spleen, adrenal glands, kidneys, and retroperitoneal lymph nodes are unremarkable.  Calcification of the abdominal aorta without aneurysm.  The stomach, small bowel, and colon are not abnormally distended.  There is infiltration in the anterior abdominal wall in the midline suggesting postoperative change. Small ventral abdominal wall hernia at the site of surgery containing fat.  Pelvis:  The uterus appears surgically absent.  No abnormal adnexal masses.  Bladder wall is not thickened.  No free or loculated pelvic fluid collections.  The appendix is normal.  No diverticulitis.  Normal alignment of the lumbar vertebrae.  IMPRESSION: Interval resection of the gallbladder with gas, fluid, and infiltrative changes in the gallbladder fossa worrisome for abscess.  Probable postoperative hernia containing fat at the midline.  Diffuse fatty infiltration of the liver.   Original Report Authenticated By: Burman Nieves, M.D.    Dg Abd Acute W/chest  05/16/2012  *RADIOLOGY REPORT*  Clinical Data: Right upper quadrant pain.  Cough and fever.  ACUTE ABDOMEN SERIES (ABDOMEN 2 VIEW & CHEST 1  VIEW)  Comparison: Chest 05/14/2012.  CT abdomen pelvis 03/13/2012  Findings: Shallow inspiration.  Heart size and pulmonary vascularity are normal.  There is linear atelectasis or infiltration in the left lung base and right upper lung.  This is progressing since the previous study.  Gas and stool throughout the abdomen.  No small or large bowel distension.  Surgical clips in the right upper quadrant.  No free air.  No abnormal air fluid levels.  No radiopaque stones. Visualized bones appear intact.  IMPRESSION: Shallow inspiration with increasing linear atelectasis or infiltration in the left lung base and right upper lung. Nonobstructive bowel gas pattern.   Original Report Authenticated By: Burman Nieves, M.D.      Disposition and Follow-up:     Discharge Orders    Future Appointments: Provider: Department: Dept Phone: Center:   06/01/2012 10:25 AM Romie Levee, MD [facility], [place] [phone] None     Future Orders Please Complete By Expires   Diet Carb Modified      Increase activity slowly      Discharge instructions      Comments:   Please call your surgeon asap or return to ED if worseing nausea, vomiting, fevers, or abdominal pain.       DISPOSITION: Home DIET: Carb modified  ACTIVITY: As tolerated  DISCHARGE FOLLOW-UP Follow-up Information    Follow up with Vanita Panda., MD. On 06/01/2012. (at 10:25 AM)    Contact information:   [address], [address] [place] [address] [phone]          Time spent on Discharge: 45 minutes  Signed:   RAI,RIPUDEEP M.D. [facility] 05/21/2012, 10:17 AM Pager: [phone]

## 2012-05-21 NOTE — Progress Notes (Signed)
Patient ID: Briana Kelley, female   DOB: 1960-08-10, 50 y.o.   MRN: [id number] HCAP (healthcare-associated pneumonia)  Subjective: Doing well.  Tolerating a diet, NAD. Drain has been removed.  Objective: Vital signs in last 24 hours: Temp:  [98 F (36.7 C)-99.1 F (37.3 C)] 98.7 F (37.1 C) (12/19 0611) Pulse Rate:  [93-105] 93  (12/19 0611) Resp:  [16-18] 16  (12/19 0611) BP: (132-153)/(77-94) 139/89 mmHg (12/19 0611) SpO2:  [93 %-96 %] 93 % (12/19 0611) Last BM Date: 05/19/12  Intake/Output from previous day: 12/18 0701 - 12/19 0700 In: 240 [P.O.:240] Out: -  Intake/Output this shift:    General appearance: alert and cooperative GI: soft, non-tender; bowel sounds normal; no masses,  no organomegaly  Lab Results:  Results for orders placed during the hospital encounter of 05/16/12 (from the past 24 hour(s))  GLUCOSE, CAPILLARY     Status: Abnormal   Collection Time   05/20/12 11:42 AM      Component Value Range   Glucose-Capillary 140 (*) 70 - 99 mg/dL   Comment 1 Documented in Chart     Comment 2 Notify RN    GLUCOSE, CAPILLARY     Status: Abnormal   Collection Time   05/20/12  4:19 PM      Component Value Range   Glucose-Capillary 129 (*) 70 - 99 mg/dL   Comment 1 Notify RN    GLUCOSE, CAPILLARY     Status: Abnormal   Collection Time   05/20/12 10:02 PM      Component Value Range   Glucose-Capillary 128 (*) 70 - 99 mg/dL  GLUCOSE, CAPILLARY     Status: Abnormal   Collection Time   05/21/12 12:09 AM      Component Value Range   Glucose-Capillary 132 (*) 70 - 99 mg/dL  GLUCOSE, CAPILLARY     Status: Abnormal   Collection Time   05/21/12  4:09 AM      Component Value Range   Glucose-Capillary 109 (*) 70 - 99 mg/dL  GLUCOSE, CAPILLARY     Status: Abnormal   Collection Time   05/21/12  8:57 AM      Component Value Range   Glucose-Capillary 107 (*) 70 - 99 mg/dL     Studies/Results Radiology     MEDS, Scheduled    . amoxicillin-clavulanate  1  tablet Oral Q12H  . enoxaparin (LOVENOX) injection  40 mg Subcutaneous Daily  . insulin aspart  0-9 Units Subcutaneous Q4H  . potassium chloride  40 mEq Oral Once     Assessment: HCAP (healthcare-associated pneumonia) UTI   Plan: Stable for discharge from surgical standpoint, patient to follow up with Dr. Maisie Fus on 06/01/12   LOS: 5 days    Blenda [facility] Pager # [phone]   05/21/2012 9:58 AM

## 2012-05-22 LAB — ANAEROBIC CULTURE

## 2012-05-23 LAB — CULTURE, BLOOD (ROUTINE X 2)
Culture: NO GROWTH
Culture: NO GROWTH

## 2012-06-01 ENCOUNTER — Encounter (INDEPENDENT_AMBULATORY_CARE_PROVIDER_SITE_OTHER): Payer: Self-pay

## 2012-06-01 ENCOUNTER — Ambulatory Visit (INDEPENDENT_AMBULATORY_CARE_PROVIDER_SITE_OTHER): Payer: BC Managed Care – PPO | Admitting: General Surgery

## 2012-06-01 ENCOUNTER — Encounter (INDEPENDENT_AMBULATORY_CARE_PROVIDER_SITE_OTHER): Payer: Self-pay | Admitting: General Surgery

## 2012-06-01 VITALS — BP 110/80 | HR 84 | Temp 97.7°F | Resp 12 | Ht 62.0 in | Wt 208.0 lb

## 2012-06-01 DIAGNOSIS — Z9049 Acquired absence of other specified parts of digestive tract: Secondary | ICD-10-CM

## 2012-06-01 DIAGNOSIS — Z9089 Acquired absence of other organs: Secondary | ICD-10-CM

## 2012-06-01 NOTE — Patient Instructions (Signed)
No heavy lifting for 8 weeks after surgery.  We will set up an apt to see your gastroenterologist for your continued abdominal pain.

## 2012-06-01 NOTE — Progress Notes (Signed)
Briana Kelley is a 51 y.o. female who is status post a lap chole on 12/11.  This was complicated by readmission for a UTI, bronchitis and possible gallbladder fossa abscess.  She received antibiotics and drain placement by IR.  This resolved her symptoms.  She is still having trouble with tolerating anything but bland foods, although she was able to eat french fries without problems.    Objective: Filed Vitals:   06/01/12 1045  BP: 110/80  Pulse: 84  Temp: 97.7 F (36.5 C)  Resp: 12    General appearance: alert and cooperative GI: normal findings: soft, non-tender  Incision: healing well   Assessment: s/p  Patient Active Problem List  Diagnosis  . HCAP (healthcare-associated pneumonia)  . Fever  . Abdominal  pain, other specified site  . Hypokalemia  . Leukocytosis  . SOB (shortness of breath)  . Diabetes mellitus  . Hyperlipidemia  . Hypothyroid  . Obesity  . Migraines  . Obstructive sleep apnea (adult) (pediatric)  . UTI (lower urinary tract infection)      Plan: SANGEETA YOUSE is s/p lap cholecystectomy on 12/11.  She has a small incisional hernia that is not giving her any trouble.  She declined any further treatment for this at this time.  She is still having digestive issues.  We discussed this prior to surgery that given her atypical symptoms, removing her gallbladder may not help these.  I will send her back to see Dr Madilyn Fireman for further evaluation.      Vanita Panda, MD [facility], [place] [phone]   06/01/2012 11:00 AM

## 2012-06-18 ENCOUNTER — Encounter (INDEPENDENT_AMBULATORY_CARE_PROVIDER_SITE_OTHER): Payer: Self-pay

## 2012-08-24 ENCOUNTER — Encounter (INDEPENDENT_AMBULATORY_CARE_PROVIDER_SITE_OTHER): Payer: Self-pay

## 2013-03-03 ENCOUNTER — Other Ambulatory Visit: Payer: Self-pay | Admitting: Gastroenterology

## 2013-10-01 HISTORY — PX: ESOPHAGOGASTRODUODENOSCOPY: SHX1529

## 2013-10-21 ENCOUNTER — Other Ambulatory Visit: Payer: Self-pay | Admitting: Gastroenterology

## 2013-11-03 ENCOUNTER — Other Ambulatory Visit (HOSPITAL_COMMUNITY): Payer: Self-pay | Admitting: Gastroenterology

## 2013-11-03 DIAGNOSIS — R140 Abdominal distension (gaseous): Secondary | ICD-10-CM

## 2013-11-03 DIAGNOSIS — R109 Unspecified abdominal pain: Secondary | ICD-10-CM

## 2013-11-12 ENCOUNTER — Ambulatory Visit (HOSPITAL_COMMUNITY)
Admission: RE | Admit: 2013-11-12 | Discharge: 2013-11-12 | Disposition: A | Discharge status: Home / Self Care | Payer: BC Managed Care – PPO | Source: Ambulatory Visit | Attending: Gastroenterology | Admitting: Gastroenterology

## 2013-11-12 DIAGNOSIS — R109 Unspecified abdominal pain: Secondary | ICD-10-CM

## 2013-11-12 DIAGNOSIS — R143 Flatulence: Secondary | ICD-10-CM

## 2013-11-12 DIAGNOSIS — R6881 Early satiety: Secondary | ICD-10-CM | POA: Insufficient documentation

## 2013-11-12 DIAGNOSIS — R141 Gas pain: Secondary | ICD-10-CM | POA: Insufficient documentation

## 2013-11-12 DIAGNOSIS — R142 Eructation: Secondary | ICD-10-CM

## 2013-11-12 DIAGNOSIS — R140 Abdominal distension (gaseous): Secondary | ICD-10-CM

## 2013-11-12 MED ORDER — TECHNETIUM TC 99M SULFUR COLLOID
2.0000 | Freq: Once | INTRAVENOUS | Status: AC | PRN
  Administered 2013-11-12: 2 via ORAL

## 2013-12-20 IMAGING — US US SOFT TISSUE HEAD/NECK
1 series · 14 of 25 positions shown · non-contrast
Comparison: None.

CLINICAL DATA: History of thyroid goiter and Hashimoto's
thyroiditis.

THYROID ULTRASOUND
TECHNIQUE: Ultrasound examination of the thyroid gland and adjacent
soft tissues was performed.

[Series 1: us soft tissue head/neck · 0.06mm/px · 14 of 47 slices shown]
[im 1/47]
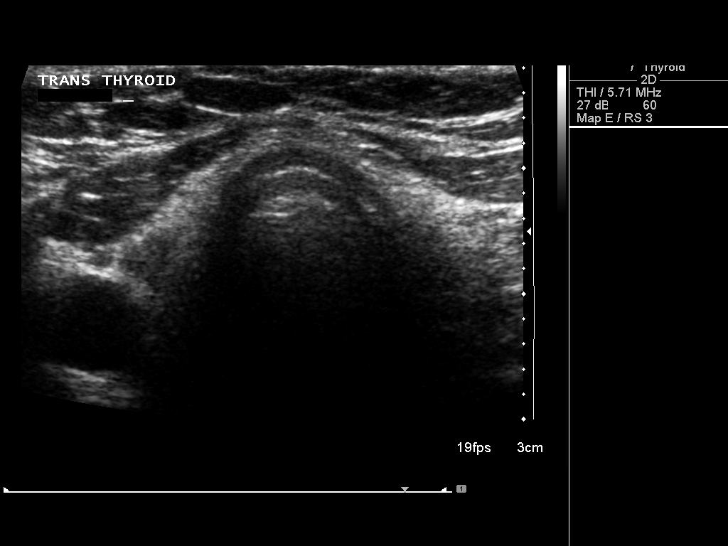
[im 4/47]
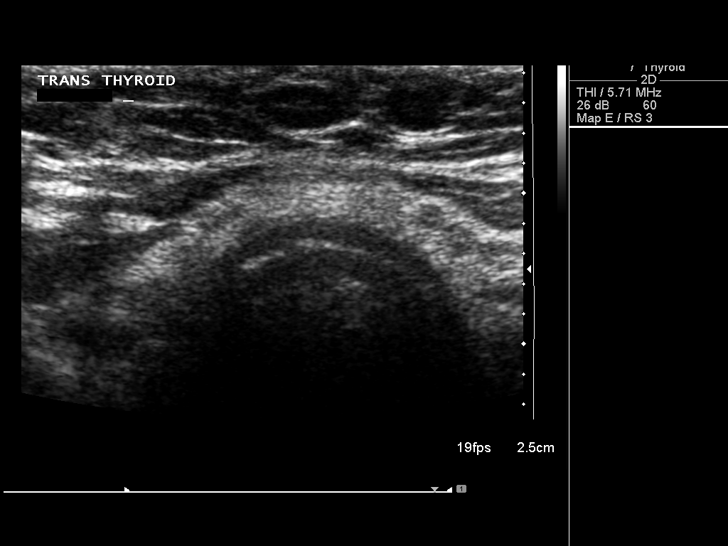
[im 8/47]
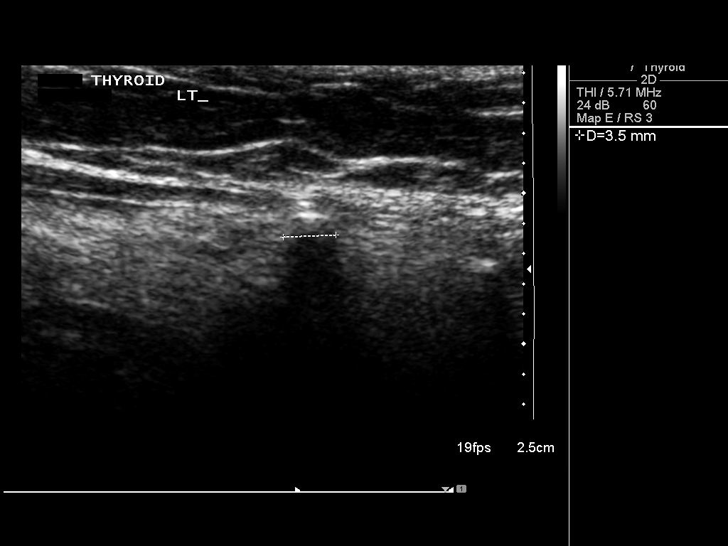
[im 12/47]
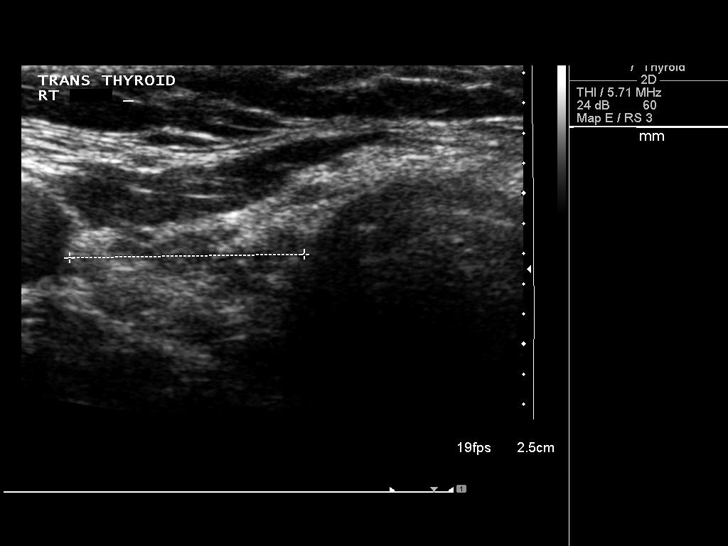
[im 16/47]
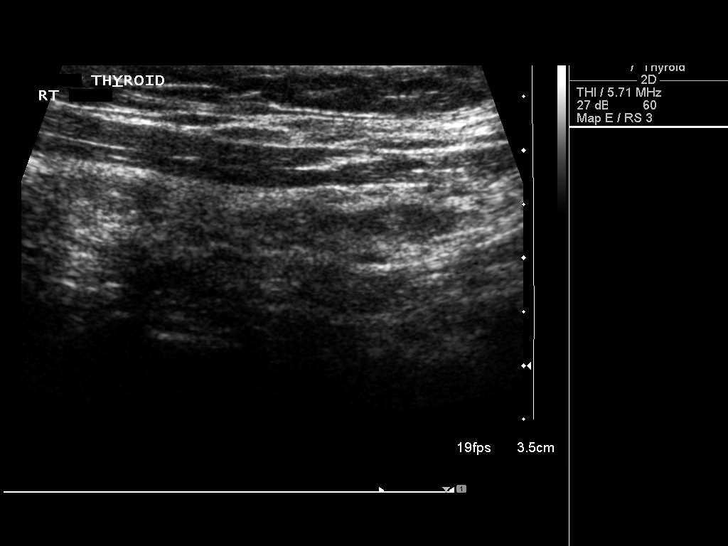
[im 18/47]
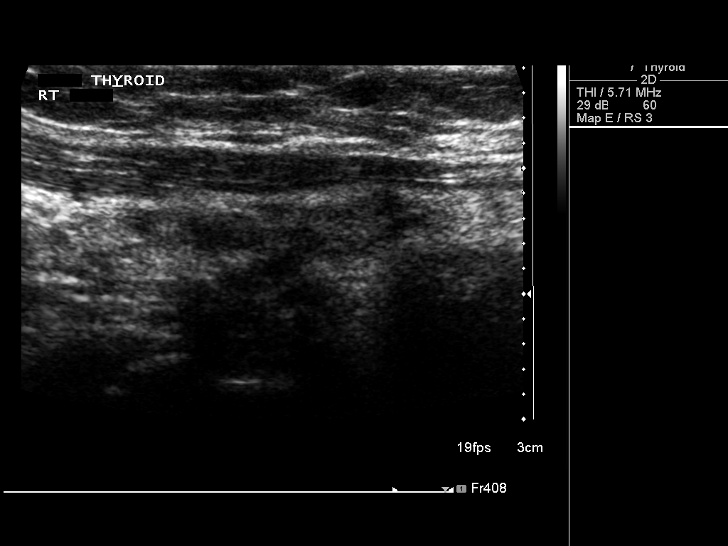
[im 22/47]
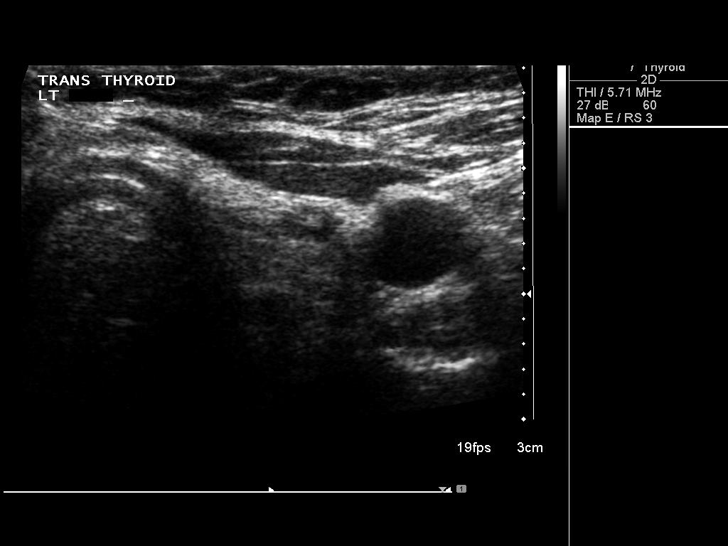
[im 25/47]
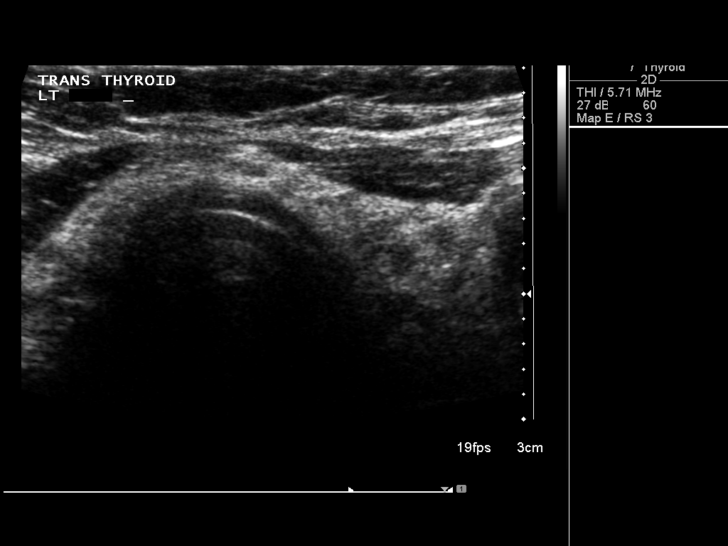
[im 29/47]
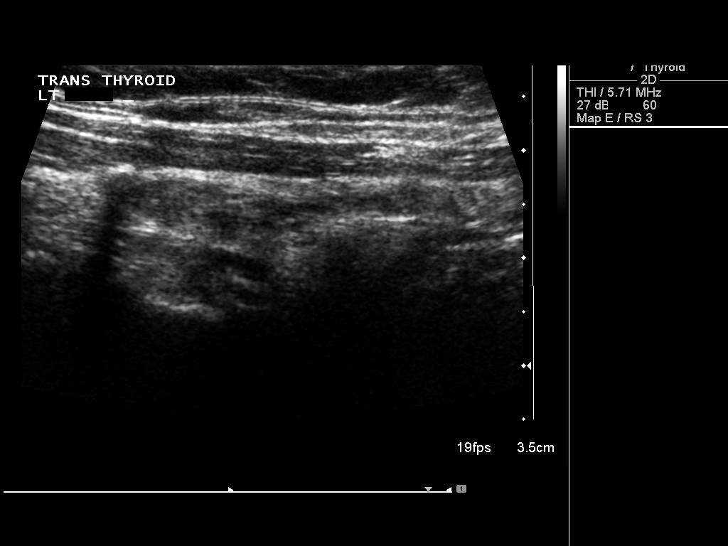
[im 31/47]
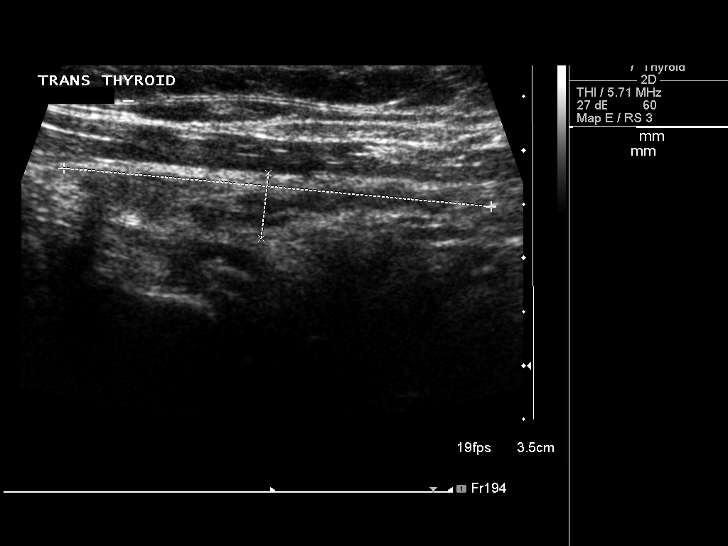
[im 35/47]
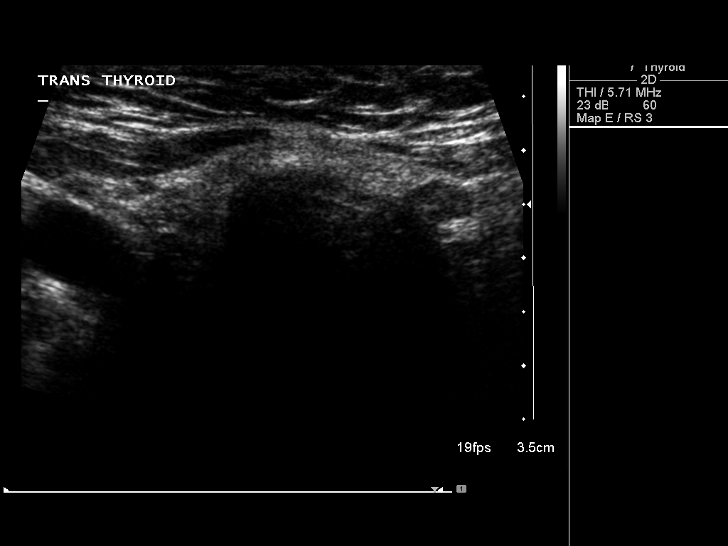
[im 39/47]
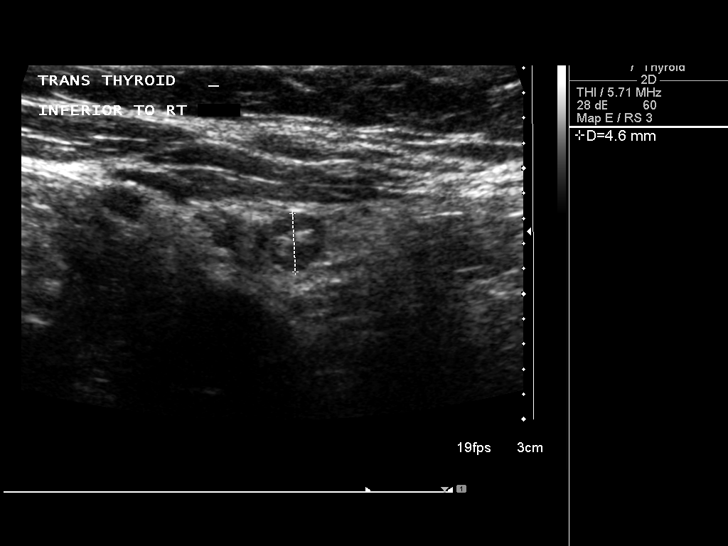
[im 43/47]
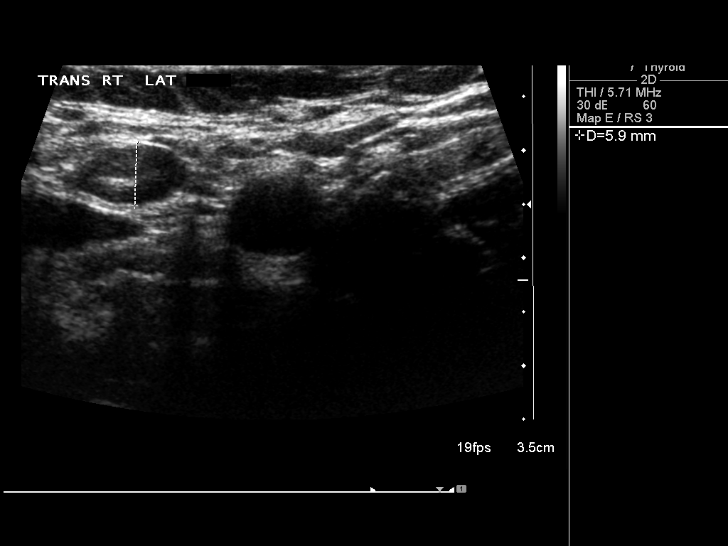
[im 47/47]
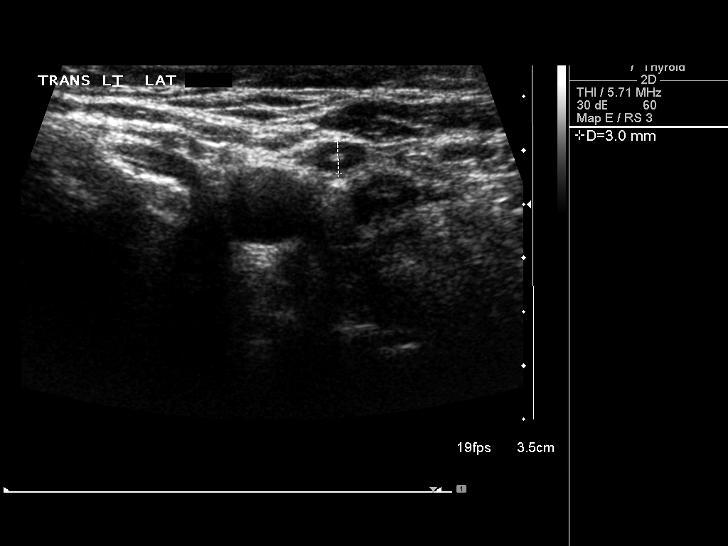

[14 of 25 positions shown; findings below may reference images not displayed]

FINDINGS: Right thyroid lobe:  4.4 x 0.8 x 1.6 cm
Left thyroid lobe:  4.0 x 0.8 x 1.4 cm
Isthmus:  0.3 cm

Focal nodules:  Thyroid echotexture remains heterogeneous and
somewhat nodular in appearance.  There are no discretely measurable
nodules.  There is a stable benign shadowing calcification in the
left portion of the isthmus.

Lymphadenopathy:  Small incidental cervical lymph nodes are
identified bilaterally.  These do not appear to be pathologically
enlarged.
IMPRESSION: No discretely measurable thyroid nodule with continued
heterogeneous appearance of the thyroid gland.  Stable
calcification in the left isthmus.

## 2014-06-30 ENCOUNTER — Other Ambulatory Visit: Payer: Self-pay | Admitting: Endocrinology

## 2014-06-30 DIAGNOSIS — E049 Nontoxic goiter, unspecified: Secondary | ICD-10-CM

## 2014-08-01 ENCOUNTER — Ambulatory Visit
Admission: RE | Admit: 2014-08-01 | Discharge: 2014-08-01 | Disposition: A | Discharge status: Home / Self Care | Payer: BLUE CROSS/BLUE SHIELD | Source: Ambulatory Visit | Attending: Endocrinology | Admitting: Endocrinology

## 2014-08-01 DIAGNOSIS — E049 Nontoxic goiter, unspecified: Secondary | ICD-10-CM

## 2015-05-31 ENCOUNTER — Other Ambulatory Visit (HOSPITAL_BASED_OUTPATIENT_CLINIC_OR_DEPARTMENT_OTHER): Payer: BLUE CROSS/BLUE SHIELD

## 2015-05-31 ENCOUNTER — Ambulatory Visit: Payer: BLUE CROSS/BLUE SHIELD

## 2015-05-31 ENCOUNTER — Ambulatory Visit (HOSPITAL_BASED_OUTPATIENT_CLINIC_OR_DEPARTMENT_OTHER): Payer: BLUE CROSS/BLUE SHIELD | Admitting: Hematology & Oncology

## 2015-05-31 ENCOUNTER — Encounter: Payer: Self-pay | Admitting: Hematology & Oncology

## 2015-05-31 VITALS — BP 131/79 | HR 95 | Temp 98.1°F | Resp 16 | Ht 62.0 in | Wt 228.0 lb

## 2015-05-31 DIAGNOSIS — D500 Iron deficiency anemia secondary to blood loss (chronic): Secondary | ICD-10-CM

## 2015-05-31 DIAGNOSIS — D509 Iron deficiency anemia, unspecified: Secondary | ICD-10-CM | POA: Insufficient documentation

## 2015-05-31 DIAGNOSIS — K909 Intestinal malabsorption, unspecified: Secondary | ICD-10-CM | POA: Insufficient documentation

## 2015-05-31 DIAGNOSIS — R58 Hemorrhage, not elsewhere classified: Secondary | ICD-10-CM

## 2015-05-31 HISTORY — DX: Intestinal malabsorption, unspecified: K90.9

## 2015-05-31 HISTORY — DX: Hemorrhage, not elsewhere classified: R58

## 2015-05-31 LAB — CBC WITH DIFFERENTIAL (CANCER CENTER ONLY)
BASO#: 0.1 10*3/uL (ref 0.0–0.2)
BASO%: 0.5 % (ref 0.0–2.0)
EOS%: 3.5 % (ref 0.0–7.0)
Eosinophils Absolute: 0.4 10*3/uL (ref 0.0–0.5)
HCT: 36.7 % (ref 34.8–46.6)
HGB: 10.8 g/dL — ABNORMAL LOW (ref 11.6–15.9)
LYMPH#: 2.7 10*3/uL (ref 0.9–3.3)
LYMPH%: 25.1 % (ref 14.0–48.0)
MCH: 20.2 pg — ABNORMAL LOW (ref 26.0–34.0)
MCHC: 29.4 g/dL — ABNORMAL LOW (ref 32.0–36.0)
MCV: 69 fL — ABNORMAL LOW (ref 81–101)
MONO#: 0.9 10*3/uL (ref 0.1–0.9)
MONO%: 8.4 % (ref 0.0–13.0)
NEUT#: 6.7 10*3/uL — ABNORMAL HIGH (ref 1.5–6.5)
NEUT%: 62.5 % (ref 39.6–80.0)
Platelets: 487 10*3/uL — ABNORMAL HIGH (ref 145–400)
RBC: 5.34 10*6/uL — ABNORMAL HIGH (ref 3.70–5.32)
RDW: 21.4 % — ABNORMAL HIGH (ref 11.1–15.7)
WBC: 10.8 10*3/uL — ABNORMAL HIGH (ref 3.9–10.0)

## 2015-05-31 LAB — COMPREHENSIVE METABOLIC PANEL
ALT: 28 U/L (ref 0–55)
AST: 25 U/L (ref 5–34)
Albumin: 3.7 g/dL (ref 3.5–5.0)
Alkaline Phosphatase: 99 U/L (ref 40–150)
Anion Gap: 12 mEq/L — ABNORMAL HIGH (ref 3–11)
BUN: 15.8 mg/dL (ref 7.0–26.0)
CO2: 23 mEq/L (ref 22–29)
Calcium: 10 mg/dL (ref 8.4–10.4)
Chloride: 104 mEq/L (ref 98–109)
Creatinine: 0.8 mg/dL (ref 0.6–1.1)
EGFR: 83 mL/min/{1.73_m2} — ABNORMAL LOW (ref >90)
Glucose: 236 mg/dl — ABNORMAL HIGH (ref 70–140)
Potassium: 4.7 mEq/L (ref 3.5–5.1)
Sodium: 140 mEq/L (ref 136–145)
Total Bilirubin: 0.36 mg/dL (ref 0.20–1.20)
Total Protein: 7.5 g/dL (ref 6.4–8.3)

## 2015-05-31 LAB — IRON AND TIBC
%SAT: 5 % — ABNORMAL LOW (ref 21–57)
Iron: 24 ug/dL — ABNORMAL LOW (ref 41–142)
TIBC: 454 ug/dL — ABNORMAL HIGH (ref 236–444)
UIBC: 430 ug/dL — ABNORMAL HIGH (ref 120–384)

## 2015-05-31 LAB — FERRITIN: Ferritin: 5 ng/ml — ABNORMAL LOW (ref 9–269)

## 2015-05-31 LAB — CHCC SATELLITE - SMEAR

## 2015-05-31 NOTE — Progress Notes (Signed)
Referral MD  Reason for Referral: Iron deficiency anemia secondary to malabsorption   Chief Complaint  Patient presents with  . OTHER    New Patient  : I have low iron.  HPI: Briana Kelley is a very nice 54 year old white female. She has history of hypothyroidism. Has history of diabetes. She's not yet on insulin.  She's been noted to have iron deficiency for at least a year. She has had a hysterectomy. This was because of bleeding.  She is on Protonix because of GERD.  She has had multiple colonoscopies thinking that there was GI bleeding. For she says, that not found any obvious blood loss.  With lab work that was sent over, she has been iron deficient for at least a year. Back in January 2016, her ferritin was 6.4. Her hemoglobin was 10.6. Her platelet count was on the high side at 445,000.  She does not chew ice. She said that her mother, right iron deficiency, chewed ice.. She's not had a mammogram probably for 5 years.  She does not smoke. She smoked quite a while ago.  She's had no rashes. There's been no issues with weight loss or weight gain. She has thyroid issues that she says are very difficult to correct.  She's had no obvious melena or bright red blood per rectum.  She's had 2 daughters. They're delivered via C-section.  She's had no mouth sores. She's had no dysphagia or odynophagia.  We were kindly asked to see her try to help with the iron deficiency anemia.   Past Medical History  Diagnosis Date  . Heart murmur   . Hyperlipidemia   . GERD (gastroesophageal reflux disease)   . Hypothyroidism   . Meningitis   . Vertigo   . Bruises easily   . Gestational hypertension 1996  . Pneumonia     "several times" (05/13/2012)  . Chronic bronchitis (HCC)     "most likely q yr" (05/13/2012)  . Sleep apnea     does not wear mask  . Type II diabetes mellitus (HCC)   . History of blood transfusion 2008    "related to low iron" (05/13/2012)  . Iron deficiency anemia  2008  . Migraine     "chronic" (05/13/2012)  . Grand mal seizure [facility]) 1999    "related to encephalitis and meningitis" (05/13/2012)  . Encephalitis 1999  . Iron deficiency anemia 05/31/2015  . Iron malabsorption 05/31/2015  . Bleeding 05/31/2015  :  Past Surgical History  Procedure Laterality Date  . Cesarean section  [id number]  . Reduction mammaplasty  2001    breast reduction  . Hernia repair  1997    umbilical  . Ovarian cyst removal  2009  . Knee arthroscopy w/ meniscal repair  1999    left knee  . Carpal tunnel release  1989    left wrist  . Wisdom tooth extraction  1980's?  . Colonoscopy    . Dilation and curettage of uterus  1997; 1998  . Cholecystectomy  05/13/2012    laparoscopic  . Tonsillectomy and adenoidectomy  ~ 2000  . Abdominal hysterectomy  2008    partial  . Tubal ligation  1999  . Cholecystectomy  05/13/2012    Procedure: LAPAROSCOPIC CHOLECYSTECTOMY WITH INTRAOPERATIVE CHOLANGIOGRAM;  Surgeon: Romie Levee, MD;  Location: [facility] OR;  Service: General;  Laterality: N/A;  :   Current outpatient prescriptions:  .  amitriptyline (ELAVIL) 25 MG tablet, Take 50 mg by mouth at bedtime. , Disp: , Rfl:  .  amoxicillin-clavulanate (AUGMENTIN) 875-125 MG per tablet, Take 1 tablet by mouth every 12 (twelve) hours. X10 DAYS, Disp: 20 tablet, Rfl: 0 .  ARMOUR THYROID 15 MG tablet, TAKE 1 TABLET BY MOUTH ON AN EMPTY STOMACH ONCE A DAY, Disp: , Rfl: 11 .  atorvastatin (LIPITOR) 10 MG tablet, Take 10 mg by mouth daily. , Disp: , Rfl:  .  baclofen (LIORESAL) 10 MG tablet, TAKE 1 TABLET THREE TIMES A DAY AS NEEDED MUSCLE TIGHTNESS, Disp: , Rfl: 2 .  butalbital-acetaminophen-caffeine (FIORICET WITH CODEINE) 50-325-40-30 MG per capsule, Take 1 capsule by mouth every 4 (four) hours as needed. For headache, Disp: , Rfl:  .  cholecalciferol (VITAMIN D) 1000 UNITS tablet, Take 5,000 Units by mouth 2 (two) times daily. , Disp: , Rfl:  .  FERREX 150 150 MG capsule, Take 150 mg by  mouth daily., Disp: , Rfl: 12 .  Flaxseed, Linseed, (FLAX SEED OIL PO), Take 2 tablets by mouth daily. , Disp: , Rfl:  .  glimepiride (AMARYL) 2 MG tablet, Take 4 mg by mouth daily before breakfast. , Disp: , Rfl:  .  ibuprofen (ADVIL,MOTRIN) 600 MG tablet, Take 1 tablet (600 mg total) by mouth 3 (three) times daily as needed for pain., Disp: 30 tablet, Rfl: 0 .  KOMBIGLYZE XR 2.10-998 MG TB24, Take 2 tablets by mouth daily. , Disp: , Rfl:  .  lisinopril (PRINIVIL,ZESTRIL) 5 MG tablet, Take 5 mg by mouth daily. , Disp: , Rfl:  .  naratriptan (AMERGE) 2.5 MG tablet, Take 2.5 mg by mouth daily as needed. Take one (1) tablet at onset of headache; if returns or does not resolve, may repeat after 4 hours; do not exceed five (5) mg in 24 hours., Disp: , Rfl:  .  omeprazole (PRILOSEC) 40 MG capsule, Take 40 mg by mouth daily. , Disp: , Rfl:  .  ondansetron (ZOFRAN) 8 MG tablet, , Disp: , Rfl:  .  pantoprazole (PROTONIX) 40 MG tablet, Take 40 mg by mouth daily., Disp: , Rfl: 5 .  promethazine (PHENERGAN) 12.5 MG tablet, Take 1 tablet (12.5 mg total) by mouth every 6 (six) hours as needed for nausea., Disp: 30 tablet, Rfl: 0 .  thyroid (ARMOUR) 120 MG tablet, Take 120 mg by mouth See admin instructions. Take 1 tablet Mon- Fri, Disp: , Rfl:  .  verapamil (CALAN-SR) 240 MG CR tablet, Take 240 mg by mouth daily. , Disp: , Rfl: :  :  Allergies  Allergen Reactions  . Asa [Aspirin] Shortness Of Breath and Other (See Comments)    "cold sweats and migraines" (05/13/2012)  . Celebrex [Celecoxib] Shortness Of Breath  . Cinnamon Flavor Anaphylaxis    sneezing  . Nitroglycerin Nausea And Vomiting    Chills and migraine; patient stayed in hospital one week after given Nitroglycerin  . Percocet [Oxycodone-Acetaminophen] Other (See Comments)    Halucinations; "and blood in my urine" (05/13/2012)  . Ultram [Tramadol] Shortness Of Breath  . Pamelor [Nortriptyline Hcl] Other (See Comments)    Jaw pain   .  Codeine Nausea Only    Gives her migraines, makes sick  :  Family History  Problem Relation Age of Onset  . Breast cancer Mother   . Breast cancer Maternal Aunt   . Lung cancer Maternal Aunt   . CAD Maternal Grandmother   . Diabetes Father   . Diabetes Paternal Aunt   :  Social History   Social History  . Marital Status: Divorced  Spouse Name: N/A  . Number of Children: N/A  . Years of Education: N/A   Occupational History  . Not on file.   Social History Main Topics  . Smoking status: Never Smoker   . Smokeless tobacco: Never Used  . Alcohol Use: No  . Drug Use: No  . Sexual Activity: No   Other Topics Concern  . Not on file   Social History Narrative  :  Pertinent items are noted in HPI.  Exam: @IPVITALS @  mildly obese white female in no obvious distress. Her vital signs are temperature of 98.1. Pulse 95 per blood pressure 131/79. Weight is 228 pounds. Head and neck exam shows no ocular or oral lesions. There are no palpable cervical or supraclavicular lymph nodes. Thyroid is nonpalpable. Lungs are clear. Cardiac exam regular rate and rhythm with no murmurs, rubs or bruits. Abdomen is soft. She has good bowel sounds. She is moderately obese. She has no fluid wave. There is no palpable liver or spleen tip. Back exam shows no tenderness over the spine, ribs or hips. Extremities shows no clubbing, cyanosis or edema. She has good range of motion of her joints. Skin exam shows some scattered hyperpigmented lesions on her back but nothing looks suspicious. Neurological exam is nonfocal.    Recent Labs  05/31/15 1021  WBC 10.8*  HGB 10.8*  HCT 36.7  PLT 487*   No results for input(s): NA, K, CL, CO2, GLUCOSE, BUN, CREATININE, CALCIUM in the last 72 hours.  Blood smear review: Slight anisocytosis. She has microcytic red blood cells. There might be some hyperchromic red blood cells. I see no target cells. She has no rouleau formation. She has no schistocytes or  spherocytes. She has no teardrop cells. I see no nucleated red blood cells. White blood cells appear normal in morphology maturation. She has no hypersegmented polys. She has no immature myeloid or lymphoid forms. Platelets are slightly increased number. Platelets are well granulated.  Pathology: None     Assessment and Plan:  Briana Kelley is a 54 year old white female with microcytic anemia. By her blood smear, this appears to be iron deficiency. She's had low ferritin levels for about a year.  I does don't think that she absorbs iron all that well. She is on a proton pump inhibitor which is very effective in decreasing stomach acid which is necessary for iron absorption.  I don't see anything on the blood smear that looks like a hemoglobinopathy.  I think that we go ahead and try her on IV iron. Other than this is the way that we can try to get her blood count better get her iron levels back to normal.  I talked her about this. I explained how IV iron Works. I explained that she probably will need more than one dose. It probably will be at least 2 weeks before she wouldn't started feel the benefit from IV iron.  She does not eat a bone marrow test. I'll see need for any x-rays. She provider due to have a mammogram done but I don't think she is all that interested in getting one done.  We spent about 45 minutes with her. We answered all her questions.  I will plan to see her back about 3 weeks or so after her second dose of iron. At that point, we should be seeing a nice improvement in her blood count.

## 2015-06-02 LAB — HEMOGLOBINOPATHY EVALUATION
Hemoglobin Other: 0 %
Hgb A2 Quant: 1.8 % — ABNORMAL LOW (ref 2.2–3.2)
Hgb A: 98.2 % — ABNORMAL HIGH (ref 96.8–97.8)
Hgb F Quant: 0 % (ref 0.0–2.0)
Hgb S Quant: 0 %

## 2015-06-02 LAB — RETICULOCYTES
ABS Retic: 61.8 10*3/uL (ref 19.0–186.0)
RBC.: 5.15 MIL/uL — ABNORMAL HIGH (ref 3.87–5.11)
Retic Ct Pct: 1.2 % (ref 0.4–2.3)

## 2015-06-02 LAB — ERYTHROPOIETIN: Erythropoietin: 94.9 m[IU]/mL — ABNORMAL HIGH (ref 2.6–18.5)

## 2015-06-07 ENCOUNTER — Ambulatory Visit (HOSPITAL_BASED_OUTPATIENT_CLINIC_OR_DEPARTMENT_OTHER): Payer: BLUE CROSS/BLUE SHIELD

## 2015-06-07 VITALS — BP 111/57 | HR 90 | Temp 97.6°F | Resp 18

## 2015-06-07 DIAGNOSIS — D509 Iron deficiency anemia, unspecified: Secondary | ICD-10-CM | POA: Diagnosis not present

## 2015-06-07 DIAGNOSIS — K909 Intestinal malabsorption, unspecified: Secondary | ICD-10-CM

## 2015-06-07 DIAGNOSIS — R58 Hemorrhage, not elsewhere classified: Secondary | ICD-10-CM

## 2015-06-07 MED ORDER — SODIUM CHLORIDE 0.9 % IV SOLN
Freq: Once | INTRAVENOUS | Status: AC
  Administered 2015-06-07: 14:00:00 via INTRAVENOUS

## 2015-06-07 MED ORDER — FERUMOXYTOL INJECTION 510 MG/17 ML
510.0000 mg | Freq: Once | INTRAVENOUS | Status: AC
  Administered 2015-06-07: 510 mg via INTRAVENOUS
  Filled 2015-06-07: qty 17

## 2015-06-07 NOTE — Patient Instructions (Signed)

## 2015-06-14 ENCOUNTER — Ambulatory Visit (HOSPITAL_BASED_OUTPATIENT_CLINIC_OR_DEPARTMENT_OTHER): Payer: BLUE CROSS/BLUE SHIELD

## 2015-06-14 ENCOUNTER — Other Ambulatory Visit: Payer: Self-pay | Admitting: Family

## 2015-06-14 VITALS — BP 121/64 | HR 97 | Temp 98.4°F

## 2015-06-14 DIAGNOSIS — D509 Iron deficiency anemia, unspecified: Secondary | ICD-10-CM | POA: Diagnosis not present

## 2015-06-14 MED ORDER — SODIUM CHLORIDE 0.9 % IV SOLN
510.0000 mg | Freq: Once | INTRAVENOUS | Status: AC
  Administered 2015-06-14: 510 mg via INTRAVENOUS
  Filled 2015-06-14: qty 17

## 2015-06-14 MED ORDER — SODIUM CHLORIDE 0.9 % IV SOLN
Freq: Once | INTRAVENOUS | Status: AC
  Administered 2015-06-14: 13:00:00 via INTRAVENOUS

## 2015-06-14 NOTE — Patient Instructions (Signed)

## 2015-07-04 ENCOUNTER — Other Ambulatory Visit: Payer: Self-pay | Admitting: Endocrinology

## 2015-07-04 DIAGNOSIS — E049 Nontoxic goiter, unspecified: Secondary | ICD-10-CM

## 2015-07-12 ENCOUNTER — Ambulatory Visit (HOSPITAL_BASED_OUTPATIENT_CLINIC_OR_DEPARTMENT_OTHER): Payer: BLUE CROSS/BLUE SHIELD | Admitting: Hematology & Oncology

## 2015-07-12 ENCOUNTER — Other Ambulatory Visit (HOSPITAL_BASED_OUTPATIENT_CLINIC_OR_DEPARTMENT_OTHER): Payer: BLUE CROSS/BLUE SHIELD

## 2015-07-12 ENCOUNTER — Encounter: Payer: Self-pay | Admitting: Hematology & Oncology

## 2015-07-12 VITALS — BP 111/63 | HR 85 | Temp 98.0°F | Resp 14 | Ht 62.0 in | Wt 226.0 lb

## 2015-07-12 DIAGNOSIS — D500 Iron deficiency anemia secondary to blood loss (chronic): Secondary | ICD-10-CM

## 2015-07-12 DIAGNOSIS — R58 Hemorrhage, not elsewhere classified: Secondary | ICD-10-CM

## 2015-07-12 DIAGNOSIS — R1012 Left upper quadrant pain: Secondary | ICD-10-CM

## 2015-07-12 DIAGNOSIS — D509 Iron deficiency anemia, unspecified: Secondary | ICD-10-CM

## 2015-07-12 DIAGNOSIS — R109 Unspecified abdominal pain: Secondary | ICD-10-CM

## 2015-07-12 DIAGNOSIS — K909 Intestinal malabsorption, unspecified: Secondary | ICD-10-CM

## 2015-07-12 LAB — CBC WITH DIFFERENTIAL (CANCER CENTER ONLY)
BASO#: 0.1 10*3/uL (ref 0.0–0.2)
BASO%: 0.5 % (ref 0.0–2.0)
EOS%: 2.8 % (ref 0.0–7.0)
Eosinophils Absolute: 0.3 10*3/uL (ref 0.0–0.5)
HCT: 41.2 % (ref 34.8–46.6)
HGB: 13.2 g/dL (ref 11.6–15.9)
LYMPH#: 4.6 10*3/uL — ABNORMAL HIGH (ref 0.9–3.3)
LYMPH%: 38.5 % (ref 14.0–48.0)
MCH: 24.5 pg — ABNORMAL LOW (ref 26.0–34.0)
MCHC: 32 g/dL (ref 32.0–36.0)
MCV: 77 fL — ABNORMAL LOW (ref 81–101)
MONO#: 1.2 10*3/uL — ABNORMAL HIGH (ref 0.1–0.9)
MONO%: 9.8 % (ref 0.0–13.0)
NEUT#: 5.8 10*3/uL (ref 1.5–6.5)
NEUT%: 48.4 % (ref 39.6–80.0)
Platelets: 368 10*3/uL (ref 145–400)
RBC: 5.38 10*6/uL — ABNORMAL HIGH (ref 3.70–5.32)
WBC: 11.9 10*3/uL — ABNORMAL HIGH (ref 3.9–10.0)

## 2015-07-12 LAB — CHCC SATELLITE - SMEAR

## 2015-07-12 LAB — TECHNOLOGIST REVIEW CHCC SATELLITE

## 2015-07-12 NOTE — Progress Notes (Signed)
Hematology and Oncology Follow Up Visit  ARVIE VILLARRUEL [id number] July 19, 1960 55 y.o. 07/12/2015   Principle Diagnosis:   Iron deficiency anemia  Current Therapy:    IV iron as indicated-patient received 2 doses in January 2017     Interim History:  Ms. Stallman is back for follow-up. She still feels very tired and fatigued. I had to think that she has a lot more going on than just iron deficiency. We saw her back in late December, her iron studies were incredibly low. Her ferritin was 5. Her iron saturation was 5.  We gave her 2 doses of Feraheme. This really did not make her feel much better.  We did other anemia studies on her. Her erythropoietin level was 94. She had a normal hemoglobin electrophoresis. She does have diabetes which is under marginal control.  We did do a reticulocyte count on her. It was less than 1% when corrected.  She has sleep apnea. She takes nothing for this area and she is on quite a lot of medications. She has hypo-thyroidism.  She is still working.  Again, fatigue continues to be her big problem.  Medications:  Current outpatient prescriptions:  .  amitriptyline (ELAVIL) 25 MG tablet, Take 50 mg by mouth at bedtime. , Disp: , Rfl:  .  ARMOUR THYROID 15 MG tablet, TAKE 1 TABLET BY MOUTH ON AN EMPTY STOMACH ONCE A DAY, Disp: , Rfl: 11 .  atorvastatin (LIPITOR) 10 MG tablet, Take 10 mg by mouth daily. , Disp: , Rfl:  .  baclofen (LIORESAL) 10 MG tablet, TAKE 1 TABLET THREE TIMES A DAY AS NEEDED MUSCLE TIGHTNESS, Disp: , Rfl: 2 .  butalbital-acetaminophen-caffeine (FIORICET WITH CODEINE) 50-325-40-30 MG per capsule, Take 1 capsule by mouth every 4 (four) hours as needed. For headache, Disp: , Rfl:  .  cholecalciferol (VITAMIN D) 1000 UNITS tablet, Take 5,000 Units by mouth 2 (two) times daily. , Disp: , Rfl:  .  Flaxseed, Linseed, (FLAX SEED OIL PO), Take 2 tablets by mouth daily. , Disp: , Rfl:  .  glimepiride (AMARYL) 2 MG tablet, Take 4 mg by mouth 2  (two) times daily. , Disp: , Rfl:  .  ibuprofen (ADVIL,MOTRIN) 600 MG tablet, Take 1 tablet (600 mg total) by mouth 3 (three) times daily as needed for pain., Disp: 30 tablet, Rfl: 0 .  KOMBIGLYZE XR 2.10-998 MG TB24, Take 2 tablets by mouth daily. , Disp: , Rfl:  .  lisinopril (PRINIVIL,ZESTRIL) 5 MG tablet, Take 5 mg by mouth daily. , Disp: , Rfl:  .  naratriptan (AMERGE) 2.5 MG tablet, Take 2.5 mg by mouth daily as needed. Take one (1) tablet at onset of headache; if returns or does not resolve, may repeat after 4 hours; do not exceed five (5) mg in 24 hours., Disp: , Rfl:  .  ondansetron (ZOFRAN) 8 MG tablet, , Disp: , Rfl:  .  pantoprazole (PROTONIX) 40 MG tablet, Take 40 mg by mouth daily., Disp: , Rfl: 5 .  promethazine (PHENERGAN) 12.5 MG tablet, Take 1 tablet (12.5 mg total) by mouth every 6 (six) hours as needed for nausea., Disp: 30 tablet, Rfl: 0 .  thyroid (ARMOUR) 120 MG tablet, Take 90 mg by mouth See admin instructions. Take 1 tablet Mon- Fri, Disp: , Rfl:  .  verapamil (CALAN-SR) 240 MG CR tablet, Take 240 mg by mouth daily. , Disp: , Rfl:   Allergies:  Allergies  Allergen Reactions  . Asa [Aspirin] Shortness Of Breath  and Other (See Comments)    "cold sweats and migraines" (05/13/2012)  . Celebrex [Celecoxib] Shortness Of Breath  . Cinnamon Flavor Anaphylaxis    sneezing  . Nitroglycerin Nausea And Vomiting    Chills and migraine; patient stayed in hospital one week after given Nitroglycerin  . Percocet [Oxycodone-Acetaminophen] Other (See Comments)    Halucinations; "and blood in my urine" (05/13/2012)  . Ultram [Tramadol] Shortness Of Breath  . Pamelor [Nortriptyline Hcl] Other (See Comments)    Jaw pain   . Codeine Nausea Only    Gives her migraines, makes sick    Past Medical History, Surgical history, Social history, and Family History were reviewed and updated.  Review of Systems: As above  Physical Exam:  height is 5\' 2"  (1.575 m) and weight is 226 lb  (102.513 kg). Her oral temperature is 98 F (36.7 C). Her blood pressure is 111/63 and her pulse is 85. Her respiration is 14.   Wt Readings from Last 3 Encounters:  07/12/15 226 lb (102.513 kg)  05/31/15 228 lb (103.42 kg)  06/01/12 208 lb (94.348 kg)     Head and neck exam shows no ocular or oral lesions. There are no palpable cervical or supraclavicular lymph nodes. Thyroid is nonpalpable. Lungs are clear. Cardiac exam regular rate and rhythm with no murmurs, rubs or bruits. Abdomen is soft. She has good bowel sounds. She is moderately obese. She has no fluid wave. There is no palpable liver or spleen tip. Back exam shows no tenderness over the spine, ribs or hips. Extremities shows no clubbing, cyanosis or edema. She has good range of motion of her joints. Skin exam shows some scattered hyperpigmented lesions on her back but nothing looks suspicious. Neurological exam is nonfocal.   Lab Results  Component Value Date   WBC 11.9* 07/12/2015   HGB 13.2 07/12/2015   HCT 41.2 07/12/2015   MCV 77* 07/12/2015   PLT 368 07/12/2015     Chemistry      Component Value Date/Time   NA 140 05/31/2015 1021   NA 140 05/20/2012 0500   K 4.7 05/31/2015 1021   K 3.4* 05/20/2012 0500   CL 105 05/20/2012 0500   CO2 23 05/31/2015 1021   CO2 24 05/20/2012 0500   BUN 15.8 05/31/2015 1021   BUN <3* 05/20/2012 0500   CREATININE 0.8 05/31/2015 1021   CREATININE 0.70 05/20/2012 0500      Component Value Date/Time   CALCIUM 10.0 05/31/2015 1021   CALCIUM 9.1 05/20/2012 0500   ALKPHOS 99 05/31/2015 1021   ALKPHOS 85 05/16/2012 2218   AST 25 05/31/2015 1021   AST 16 05/16/2012 2218   ALT 28 05/31/2015 1021   ALT 18 05/16/2012 2218   BILITOT 0.36 05/31/2015 1021   BILITOT 0.4 05/16/2012 2218         Impression and Plan: Ms. Rovner is a 55 year old white female. She has a history of iron deficiency anemia. We have replaced her iron very nicely. Her hemoglobin has come of incredibly well.  Hemoglobin has increased by 3 points Her MCV has come up. Again, I would think that iron deficiency would not be a problem for her.  We will see what her iron studies show. It is possible that she still might be iron deficient. If so, we will try to give her some more iron. We will see if this helps her out.  I just feel bad that she is not getting better. Again, it there might be  multiple other issues that we just did not fix in our clinic.  We will see what her iron studies show. This will let us know when to get her back into the office.  She is complaining of some abdominal pain. I think it would be worthwhile to get a abdominal ultrasound on her and see what that shows. She is agreeable to this.  I spent about 35 minutes with her today area and I reviewed all of her lab work with her.   Josph Macho, MD 2/8/20175:12 PM

## 2015-07-13 LAB — RETICULOCYTES: Reticulocyte Count: 0.9 % (ref 0.6–2.6)

## 2015-07-13 LAB — IRON AND TIBC
%SAT: 14 % — ABNORMAL LOW (ref 21–57)
Iron: 45 ug/dL (ref 41–142)
TIBC: 319 ug/dL (ref 236–444)
UIBC: 274 ug/dL (ref 120–384)

## 2015-07-13 LAB — FERRITIN: Ferritin: 130 ng/ml (ref 9–269)

## 2015-07-14 ENCOUNTER — Ambulatory Visit (HOSPITAL_BASED_OUTPATIENT_CLINIC_OR_DEPARTMENT_OTHER)
Admission: RE | Admit: 2015-07-14 | Discharge: 2015-07-14 | Disposition: A | Discharge status: Home / Self Care | Payer: BLUE CROSS/BLUE SHIELD | Source: Ambulatory Visit | Attending: Hematology & Oncology | Admitting: Hematology & Oncology

## 2015-07-14 DIAGNOSIS — R1012 Left upper quadrant pain: Secondary | ICD-10-CM | POA: Diagnosis present

## 2015-07-14 DIAGNOSIS — R110 Nausea: Secondary | ICD-10-CM | POA: Diagnosis not present

## 2015-07-14 DIAGNOSIS — K760 Fatty (change of) liver, not elsewhere classified: Secondary | ICD-10-CM | POA: Diagnosis not present

## 2015-07-14 DIAGNOSIS — R1084 Generalized abdominal pain: Secondary | ICD-10-CM | POA: Insufficient documentation

## 2015-07-14 DIAGNOSIS — R140 Abdominal distension (gaseous): Secondary | ICD-10-CM | POA: Diagnosis not present

## 2015-07-17 ENCOUNTER — Telehealth: Payer: Self-pay | Admitting: *Deleted

## 2015-07-17 NOTE — Telephone Encounter (Signed)
Call - iron is better but still low!! Need 1 more dose of iron. Please set up in 1-2 week.   Call - U/S is pretty normal. You have some fatty infiltration in the liver which can happen after gallbladder surgery. Spleen is ok. Kidneys are ok!! Hope ou have a great weekend!!! pete    Patient aware. Appointments made.

## 2015-07-19 ENCOUNTER — Ambulatory Visit (HOSPITAL_BASED_OUTPATIENT_CLINIC_OR_DEPARTMENT_OTHER): Payer: BLUE CROSS/BLUE SHIELD

## 2015-07-19 VITALS — BP 112/64 | HR 82 | Temp 98.3°F | Resp 18

## 2015-07-19 DIAGNOSIS — D509 Iron deficiency anemia, unspecified: Secondary | ICD-10-CM | POA: Diagnosis not present

## 2015-07-19 MED ORDER — SODIUM CHLORIDE 0.9 % IV SOLN
510.0000 mg | Freq: Once | INTRAVENOUS | Status: AC
  Administered 2015-07-19: 510 mg via INTRAVENOUS
  Filled 2015-07-19: qty 17

## 2015-07-19 MED ORDER — SODIUM CHLORIDE 0.9 % IV SOLN
INTRAVENOUS | Status: DC
  Administered 2015-07-19: 14:00:00 via INTRAVENOUS

## 2015-07-19 NOTE — Patient Instructions (Signed)

## 2015-08-08 ENCOUNTER — Ambulatory Visit
Admission: RE | Admit: 2015-08-08 | Discharge: 2015-08-08 | Disposition: A | Discharge status: Home / Self Care | Payer: BLUE CROSS/BLUE SHIELD | Source: Ambulatory Visit | Attending: Endocrinology | Admitting: Endocrinology

## 2015-08-08 DIAGNOSIS — E049 Nontoxic goiter, unspecified: Secondary | ICD-10-CM

## 2016-01-19 ENCOUNTER — Encounter: Payer: Self-pay | Admitting: Hematology & Oncology

## 2016-02-01 ENCOUNTER — Other Ambulatory Visit: Payer: Self-pay | Admitting: *Deleted

## 2016-02-01 DIAGNOSIS — D509 Iron deficiency anemia, unspecified: Secondary | ICD-10-CM

## 2016-02-02 ENCOUNTER — Ambulatory Visit (HOSPITAL_BASED_OUTPATIENT_CLINIC_OR_DEPARTMENT_OTHER): Payer: BLUE CROSS/BLUE SHIELD | Admitting: Family

## 2016-02-02 ENCOUNTER — Other Ambulatory Visit (HOSPITAL_BASED_OUTPATIENT_CLINIC_OR_DEPARTMENT_OTHER): Payer: BLUE CROSS/BLUE SHIELD

## 2016-02-02 VITALS — BP 121/75 | HR 96 | Temp 98.3°F | Resp 18 | Wt 225.0 lb

## 2016-02-02 DIAGNOSIS — D509 Iron deficiency anemia, unspecified: Secondary | ICD-10-CM

## 2016-02-02 DIAGNOSIS — R1011 Right upper quadrant pain: Secondary | ICD-10-CM

## 2016-02-02 DIAGNOSIS — D72829 Elevated white blood cell count, unspecified: Secondary | ICD-10-CM

## 2016-02-02 DIAGNOSIS — E039 Hypothyroidism, unspecified: Secondary | ICD-10-CM

## 2016-02-02 DIAGNOSIS — K7581 Nonalcoholic steatohepatitis (NASH): Secondary | ICD-10-CM | POA: Diagnosis not present

## 2016-02-02 LAB — COMPREHENSIVE METABOLIC PANEL
ALT: 50 U/L (ref 0–55)
AST: 50 U/L — ABNORMAL HIGH (ref 5–34)
Albumin: 3.9 g/dL (ref 3.5–5.0)
Alkaline Phosphatase: 87 U/L (ref 40–150)
Anion Gap: 13 mEq/L — ABNORMAL HIGH (ref 3–11)
BUN: 14.6 mg/dL (ref 7.0–26.0)
CO2: 22 mEq/L (ref 22–29)
Calcium: 10.3 mg/dL (ref 8.4–10.4)
Chloride: 103 mEq/L (ref 98–109)
Creatinine: 0.9 mg/dL (ref 0.6–1.1)
EGFR: 73 mL/min/{1.73_m2} — ABNORMAL LOW (ref >90)
Glucose: 235 mg/dl — ABNORMAL HIGH (ref 70–140)
Potassium: 4.5 mEq/L (ref 3.5–5.1)
Sodium: 139 mEq/L (ref 136–145)
Total Bilirubin: 0.31 mg/dL (ref 0.20–1.20)
Total Protein: 7.6 g/dL (ref 6.4–8.3)

## 2016-02-02 LAB — CBC WITH DIFFERENTIAL (CANCER CENTER ONLY)
BASO#: 0.1 10*3/uL (ref 0.0–0.2)
BASO%: 0.5 % (ref 0.0–2.0)
EOS%: 3.8 % (ref 0.0–7.0)
Eosinophils Absolute: 0.5 10*3/uL (ref 0.0–0.5)
HCT: 45.3 % (ref 34.8–46.6)
HGB: 15.3 g/dL (ref 11.6–15.9)
LYMPH#: 4.3 10*3/uL — ABNORMAL HIGH (ref 0.9–3.3)
LYMPH%: 35 % (ref 14.0–48.0)
MCH: 30.4 pg (ref 26.0–34.0)
MCHC: 33.8 g/dL (ref 32.0–36.0)
MCV: 90 fL (ref 81–101)
MONO#: 1 10*3/uL — ABNORMAL HIGH (ref 0.1–0.9)
MONO%: 7.8 % (ref 0.0–13.0)
NEUT#: 6.6 10*3/uL — ABNORMAL HIGH (ref 1.5–6.5)
NEUT%: 52.9 % (ref 39.6–80.0)
Platelets: 326 10*3/uL (ref 145–400)
RBC: 5.04 10*6/uL (ref 3.70–5.32)
RDW: 13.6 % (ref 11.1–15.7)
WBC: 12.4 10*3/uL — ABNORMAL HIGH (ref 3.9–10.0)

## 2016-02-02 LAB — IRON AND TIBC
%SAT: 13 % — ABNORMAL LOW (ref 21–57)
Iron: 44 ug/dL (ref 41–142)
TIBC: 344 ug/dL (ref 236–444)
UIBC: 300 ug/dL (ref 120–384)

## 2016-02-02 LAB — FERRITIN: Ferritin: 97 ng/ml (ref 9–269)

## 2016-02-02 LAB — CHCC SATELLITE - SMEAR

## 2016-02-02 NOTE — Progress Notes (Signed)
Hematology and Oncology Follow Up Visit  RUKAYA KLEINSCHMIDT [id number] 1961/02/29 55 y.o. 02/02/2016   Principle Diagnosis:  Leukocytosis Iron deficiency anemia   Current Therapy:   IV iron as indicated - last received in February 2017    Interim History:  Ms. Zimny is here today for a new referral for leukopenia. She has a long history of an elevated WBC count. In January of 2016 her WBC count was 15.9. Her count is 12.4 today. She has no problem with frequent infections. No anemia or thrombocytopenia. Her record from [facility] listed NASH as one of her diagnoses. She also has hypothyroidism on Armour thyroid.  No personal or familial history of leukemia or lymphoma. She had an aunt with breast cancer. She refuses to have a mammogram due the pain from "smashing." No lymphadenopathy found on exam. No episodes of bleeding, bruising or petechiae.  No fever, chills, n/v, rash, dizziness, SOB, chest pain, palpitations or changes in bowel or bladder habits.  She had some right upper quadrant pain on palpation and exam.  She has a dry nagging cough that is relieved with having a drink. She has sinus drainage off and on with congestion. This is a long standing issue that she attributes to the humidity here.  Her appetite comes and goes which is not a new issue for her. She is staying hydrated. Her weight is stable. Her blood sugars have been fairly well controlled on Jardiance and Amaryl.   Medications:    Medication List       Accurate as of 02/02/16  1:50 PM. Always use your most recent med list.          amitriptyline 25 MG tablet Commonly known as:  ELAVIL Take 50 mg by mouth at bedtime.   atorvastatin 10 MG tablet Commonly known as:  LIPITOR Take 10 mg by mouth daily.   baclofen 10 MG tablet Commonly known as:  LIORESAL TAKE 1 TABLET THREE TIMES A DAY AS NEEDED MUSCLE TIGHTNESS   butalbital-acetaminophen-caffeine 50-325-40-30 MG capsule Commonly known as:  FIORICET WITH  CODEINE Take 1 capsule by mouth every 4 (four) hours as needed. For headache   cholecalciferol 1000 units tablet Commonly known as:  VITAMIN D Take 5,000 Units by mouth 2 (two) times daily.   FLAX SEED OIL PO Take 2 tablets by mouth daily.   glimepiride 2 MG tablet Commonly known as:  AMARYL Take 4 mg by mouth 2 (two) times daily.   ibuprofen 600 MG tablet Commonly known as:  ADVIL,MOTRIN Take 1 tablet (600 mg total) by mouth 3 (three) times daily as needed for pain.   KOMBIGLYZE XR 2.10-998 MG Tb24 Generic drug:  Saxagliptin-Metformin Take 2 tablets by mouth daily.   lisinopril 5 MG tablet Commonly known as:  PRINIVIL,ZESTRIL Take 5 mg by mouth daily.   naratriptan 2.5 MG tablet Commonly known as:  AMERGE Take 2.5 mg by mouth daily as needed. Take one (1) tablet at onset of headache; if returns or does not resolve, may repeat after 4 hours; do not exceed five (5) mg in 24 hours.   ondansetron 8 MG tablet Commonly known as:  ZOFRAN   pantoprazole 40 MG tablet Commonly known as:  PROTONIX Take 40 mg by mouth daily.   promethazine 12.5 MG tablet Commonly known as:  PHENERGAN Take 1 tablet (12.5 mg total) by mouth every 6 (six) hours as needed for nausea.   thyroid 120 MG tablet Commonly known as:  ARMOUR Take 90  mg by mouth See admin instructions. Take 1 tablet Mon- Fri   ARMOUR THYROID 15 MG tablet Generic drug:  thyroid TAKE 1 TABLET BY MOUTH ON AN EMPTY STOMACH ONCE A DAY   verapamil 240 MG CR tablet Commonly known as:  CALAN-SR Take 240 mg by mouth daily.       Allergies:  Allergies  Allergen Reactions  . Asa [Aspirin] Shortness Of Breath and Other (See Comments)    "cold sweats and migraines" (05/13/2012)  . Celebrex [Celecoxib] Shortness Of Breath  . Cinnamon Flavor Anaphylaxis    sneezing  . Nitroglycerin Nausea And Vomiting    Chills and migraine; patient stayed in hospital one week after given Nitroglycerin  . Percocet [Oxycodone-Acetaminophen]  Other (See Comments)    Halucinations; "and blood in my urine" (05/13/2012)  . Ultram [Tramadol] Shortness Of Breath  . Pamelor [Nortriptyline Hcl] Other (See Comments)    Jaw pain   . Codeine Nausea Only    Gives her migraines, makes sick    Past Medical History, Surgical history, Social history, and Family History were reviewed and updated.  Review of Systems: All other 10 point review of systems is negative.   Physical Exam:  vitals were not taken for this visit.  Wt Readings from Last 3 Encounters:  07/12/15 226 lb (102.5 kg)  05/31/15 228 lb (103.4 kg)  06/01/12 208 lb (94.3 kg)    Ocular: Sclerae unicteric, pupils equal, round and reactive to light Ear-nose-throat: Oropharynx clear, dentition fair Lymphatic: No cervical supraclavicular adenopathy Lungs no rales or rhonchi, good excursion bilaterally Heart regular rate and rhythm, no murmur appreciated Abd soft, nontender, positive bowel sounds, no liver or spleen tip palpated on exam  MSK no focal spinal tenderness, no joint edema Neuro: non-focal, well-oriented, appropriate affect Breasts: Deferred   Lab Results  Component Value Date   WBC 12.4 (H) 02/02/2016   HGB 15.3 02/02/2016   HCT 45.3 02/02/2016   MCV 90 02/02/2016   PLT 326 02/02/2016   Lab Results  Component Value Date   FERRITIN 130 07/12/2015   IRON 45 07/12/2015   TIBC 319 07/12/2015   UIBC 274 07/12/2015   IRONPCTSAT 14 (L) 07/12/2015   Lab Results  Component Value Date   RETICCTPCT 1.2 05/31/2015   RBC 5.04 02/02/2016   RETICCTABS 61.8 05/31/2015   No results found for: KPAFRELGTCHN, LAMBDASER, KAPLAMBRATIO No results found for: IGGSERUM, IGA, IGMSERUM No results found for: Dorene Ar, A1GS, Sondra Barges, MSPIKE, SPEI   Chemistry      Component Value Date/Time   NA 140 05/31/2015 1021   K 4.7 05/31/2015 1021   CL 105 05/20/2012 0500   CO2 23 05/31/2015 1021   BUN 15.8 05/31/2015 1021   CREATININE 0.8  05/31/2015 1021      Component Value Date/Time   CALCIUM 10.0 05/31/2015 1021   ALKPHOS 99 05/31/2015 1021   AST 25 05/31/2015 1021   ALT 28 05/31/2015 1021   BILITOT 0.36 05/31/2015 1021     Impression and Plan: Ms. Demicco is a 55 yo white female with a history of iron deficiency anemia and leukocytosis. No anemia or thrombocytopenia.  We will see what her iron studies show and bring her back in next week for an infusion if needed.  I have ordered an abdominal US to assess the liver. She has a history of NASH with [facility] and is having right upper quadrant pain.  She is on quite a bit of medication.  At this time her diabetes is fairly well controlled but she states that her thyroid has been an issue for her.  I also had a blood smear made for Dr. Myna HidalgoEnnever to view once he returns next week.  We will follow-up with her once he has viewed her blood smear and we have the results to her abdominal US.  She will contact our office with any questions or concerns. We can certainly see her sooner if need be.   Verdie MosherINCINNATI,SARAH M, NP 9/1/20171:50 PM

## 2016-02-03 LAB — RETICULOCYTES: Reticulocyte Count: 1.5 % (ref 0.6–2.6)

## 2016-02-06 ENCOUNTER — Other Ambulatory Visit: Payer: Self-pay | Admitting: Family

## 2016-02-06 DIAGNOSIS — K7581 Nonalcoholic steatohepatitis (NASH): Secondary | ICD-10-CM

## 2016-02-06 DIAGNOSIS — R1011 Right upper quadrant pain: Secondary | ICD-10-CM

## 2016-02-08 ENCOUNTER — Ambulatory Visit (HOSPITAL_BASED_OUTPATIENT_CLINIC_OR_DEPARTMENT_OTHER)
Admission: RE | Admit: 2016-02-08 | Discharge: 2016-02-08 | Disposition: A | Discharge status: Home / Self Care | Payer: BLUE CROSS/BLUE SHIELD | Source: Ambulatory Visit | Attending: Family | Admitting: Family

## 2016-02-08 DIAGNOSIS — R1011 Right upper quadrant pain: Secondary | ICD-10-CM | POA: Insufficient documentation

## 2016-02-08 DIAGNOSIS — K7581 Nonalcoholic steatohepatitis (NASH): Secondary | ICD-10-CM | POA: Insufficient documentation

## 2016-02-09 ENCOUNTER — Telehealth: Payer: Self-pay | Admitting: Family

## 2016-02-09 NOTE — Telephone Encounter (Signed)
No answer - will try again later

## 2016-02-13 ENCOUNTER — Other Ambulatory Visit: Payer: Self-pay | Admitting: Family

## 2016-02-13 ENCOUNTER — Telehealth: Payer: Self-pay | Admitting: *Deleted

## 2016-02-13 NOTE — Telephone Encounter (Signed)
-----   Message from Verdie MosherSarah M [place], NP sent at 02/13/2016  9:36 AM EDT ----- Regarding: Iron  Iron was low. She will need one dose of feraheme this week please. We will plan to follow-up with her in 3 months. Abdominal US showed hepatic steatosis. No new issues noted.  Thank you!  Sarah    ----- Message ----- From: Leory PlowmanInterface, Rad Results In Sent: 02/08/2016  10:09 AM To: Verdie MosherSarah M [place], NP

## 2016-03-05 ENCOUNTER — Ambulatory Visit (HOSPITAL_BASED_OUTPATIENT_CLINIC_OR_DEPARTMENT_OTHER): Payer: BLUE CROSS/BLUE SHIELD

## 2016-03-05 VITALS — BP 121/69 | HR 83 | Temp 98.2°F | Resp 18

## 2016-03-05 DIAGNOSIS — K909 Intestinal malabsorption, unspecified: Secondary | ICD-10-CM

## 2016-03-05 DIAGNOSIS — D509 Iron deficiency anemia, unspecified: Secondary | ICD-10-CM | POA: Diagnosis not present

## 2016-03-05 DIAGNOSIS — R58 Hemorrhage, not elsewhere classified: Secondary | ICD-10-CM

## 2016-03-05 MED ORDER — SODIUM CHLORIDE 0.9 % IV SOLN
Freq: Once | INTRAVENOUS | Status: AC
  Administered 2016-03-05: 11:00:00 via INTRAVENOUS

## 2016-03-05 MED ORDER — SODIUM CHLORIDE 0.9 % IV SOLN
510.0000 mg | Freq: Once | INTRAVENOUS | Status: AC
  Administered 2016-03-05: 510 mg via INTRAVENOUS
  Filled 2016-03-05: qty 17

## 2016-03-05 NOTE — Patient Instructions (Signed)

## 2016-04-17 DIAGNOSIS — Z79899 Other long term (current) drug therapy: Secondary | ICD-10-CM | POA: Diagnosis not present

## 2016-04-17 DIAGNOSIS — E1165 Type 2 diabetes mellitus with hyperglycemia: Secondary | ICD-10-CM | POA: Diagnosis not present

## 2016-04-17 DIAGNOSIS — K219 Gastro-esophageal reflux disease without esophagitis: Secondary | ICD-10-CM | POA: Diagnosis not present

## 2016-04-17 DIAGNOSIS — E785 Hyperlipidemia, unspecified: Secondary | ICD-10-CM | POA: Diagnosis not present

## 2016-04-17 DIAGNOSIS — E119 Type 2 diabetes mellitus without complications: Secondary | ICD-10-CM | POA: Diagnosis not present

## 2016-05-10 ENCOUNTER — Other Ambulatory Visit: Payer: Self-pay

## 2016-05-10 DIAGNOSIS — D509 Iron deficiency anemia, unspecified: Secondary | ICD-10-CM

## 2016-05-13 ENCOUNTER — Other Ambulatory Visit (HOSPITAL_BASED_OUTPATIENT_CLINIC_OR_DEPARTMENT_OTHER): Payer: BLUE CROSS/BLUE SHIELD

## 2016-05-13 ENCOUNTER — Encounter: Payer: Self-pay | Admitting: Family

## 2016-05-13 ENCOUNTER — Ambulatory Visit (HOSPITAL_BASED_OUTPATIENT_CLINIC_OR_DEPARTMENT_OTHER): Payer: BLUE CROSS/BLUE SHIELD | Admitting: Family

## 2016-05-13 ENCOUNTER — Ambulatory Visit (HOSPITAL_BASED_OUTPATIENT_CLINIC_OR_DEPARTMENT_OTHER)
Admission: RE | Admit: 2016-05-13 | Discharge: 2016-05-13 | Disposition: A | Discharge status: Home / Self Care | Payer: BLUE CROSS/BLUE SHIELD | Source: Ambulatory Visit | Attending: Family | Admitting: Family

## 2016-05-13 VITALS — BP 122/71 | HR 87 | Temp 99.3°F | Wt 216.0 lb

## 2016-05-13 DIAGNOSIS — D509 Iron deficiency anemia, unspecified: Secondary | ICD-10-CM | POA: Diagnosis not present

## 2016-05-13 DIAGNOSIS — D751 Secondary polycythemia: Secondary | ICD-10-CM | POA: Diagnosis not present

## 2016-05-13 DIAGNOSIS — Z1231 Encounter for screening mammogram for malignant neoplasm of breast: Secondary | ICD-10-CM

## 2016-05-13 DIAGNOSIS — D72829 Elevated white blood cell count, unspecified: Secondary | ICD-10-CM

## 2016-05-13 LAB — CBC WITH DIFFERENTIAL (CANCER CENTER ONLY)
BASO#: 0.1 10*3/uL (ref 0.0–0.2)
BASO%: 0.6 % (ref 0.0–2.0)
EOS%: 3.6 % (ref 0.0–7.0)
Eosinophils Absolute: 0.5 10*3/uL (ref 0.0–0.5)
HCT: 47.5 % — ABNORMAL HIGH (ref 34.8–46.6)
HGB: 16.1 g/dL — ABNORMAL HIGH (ref 11.6–15.9)
LYMPH#: 3.7 10*3/uL — ABNORMAL HIGH (ref 0.9–3.3)
LYMPH%: 30.2 % (ref 14.0–48.0)
MCH: 30.8 pg (ref 26.0–34.0)
MCHC: 33.9 g/dL (ref 32.0–36.0)
MCV: 91 fL (ref 81–101)
MONO#: 0.8 10*3/uL (ref 0.1–0.9)
MONO%: 6.6 % (ref 0.0–13.0)
NEUT#: 7.3 10*3/uL — ABNORMAL HIGH (ref 1.5–6.5)
NEUT%: 59 % (ref 39.6–80.0)
Platelets: 312 10*3/uL (ref 145–400)
RBC: 5.22 10*6/uL (ref 3.70–5.32)
RDW: 14.2 % (ref 11.1–15.7)
WBC: 12.4 10*3/uL — ABNORMAL HIGH (ref 3.9–10.0)

## 2016-05-13 LAB — COMPREHENSIVE METABOLIC PANEL
ALT: 49 U/L (ref 0–55)
AST: 32 U/L (ref 5–34)
Albumin: 3.9 g/dL (ref 3.5–5.0)
Alkaline Phosphatase: 94 U/L (ref 40–150)
Anion Gap: 12 mEq/L — ABNORMAL HIGH (ref 3–11)
BUN: 17.9 mg/dL (ref 7.0–26.0)
CO2: 24 mEq/L (ref 22–29)
Calcium: 10.3 mg/dL (ref 8.4–10.4)
Chloride: 101 mEq/L (ref 98–109)
Creatinine: 0.9 mg/dL (ref 0.6–1.1)
EGFR: 71 mL/min/{1.73_m2} — ABNORMAL LOW (ref >90)
Glucose: 216 mg/dl — ABNORMAL HIGH (ref 70–140)
Potassium: 4.2 mEq/L (ref 3.5–5.1)
Sodium: 138 mEq/L (ref 136–145)
Total Bilirubin: 0.45 mg/dL (ref 0.20–1.20)
Total Protein: 7.9 g/dL (ref 6.4–8.3)

## 2016-05-13 LAB — CHCC SATELLITE - SMEAR

## 2016-05-13 NOTE — Progress Notes (Signed)
Hematology and Oncology Follow Up Visit  Briana Kelley [id number] [birth year] 55 y.o. 05/13/2016   Principle Diagnosis:  Leukocytosis Iron deficiency anemia   Current Therapy:   IV iron as indicated - last received in October 2017    Interim History:  Briana Kelley is here today for follow-up. She is doing well and has no complaints at this time. She is spending time with her daughters and staying busy at work.  She received iron in October for a low iron saturation.  Her Hgb has stayed stable despite being iron deficient in the past. Hgb today is 16.1 with an MCV of 91. We will check JAK-2 today.  Her WBC count is stable at 12.4. She has had no problem with frequent infections. No fever, chills, n/v, rash, dizziness, SOB, chest pain, palpitations or changes in bowel or bladder habits.   She has chronic migraines and is effectively treated for this.  No lymphadenopathy found on exam. No episodes of bleeding, bruising or petechiae.  Her appetite comes and goes which is not a new issue for her. She is staying hydrated. Her weight is stable.  Her blood sugars have been better controlled on Jardiance and Amaryl.  She would like to have a mammogram. She is past due for one but had wanted to hold off in the past. We will get this scheduled for her.   Medications:    Medication List       Accurate as of 05/13/16 11:58 AM. Always use your most recent med list.          Alogliptin-Metformin HCl 12.10-998 MG Tabs Take 12.5 mg by mouth 2 (two) times daily. 12.5-1000mg    amitriptyline 25 MG tablet Commonly known as:  ELAVIL Take 50 mg by mouth at bedtime.   atorvastatin 10 MG tablet Commonly known as:  LIPITOR Take 10 mg by mouth daily.   baclofen 10 MG tablet Commonly known as:  LIORESAL TAKE 1 TABLET THREE TIMES A DAY AS NEEDED MUSCLE TIGHTNESS   butalbital-acetaminophen-caffeine 50-325-40-30 MG capsule Commonly known as:  FIORICET WITH CODEINE Take 1 capsule by mouth every 4  (four) hours as needed. For headache   cholecalciferol 1000 units tablet Commonly known as:  VITAMIN D Take 5,000 Units by mouth 2 (two) times daily.   FLAX SEED OIL PO Take 2 tablets by mouth daily.   glimepiride 2 MG tablet Commonly known as:  AMARYL Take 4 mg by mouth 2 (two) times daily.   ibuprofen 600 MG tablet Commonly known as:  ADVIL,MOTRIN Take 1 tablet (600 mg total) by mouth 3 (three) times daily as needed for pain.   JARDIANCE 10 MG Tabs tablet Generic drug:  empagliflozin Take 10 mg by mouth daily.   KOMBIGLYZE XR 2.10-998 MG Tb24 Generic drug:  Saxagliptin-Metformin Take 2 tablets by mouth daily.   lisinopril 5 MG tablet Commonly known as:  PRINIVIL,ZESTRIL Take 5 mg by mouth daily.   naratriptan 2.5 MG tablet Commonly known as:  AMERGE Take 2.5 mg by mouth daily as needed. Take one (1) tablet at onset of headache; if returns or does not resolve, may repeat after 4 hours; do not exceed five (5) mg in 24 hours.   ondansetron 8 MG tablet Commonly known as:  ZOFRAN   pantoprazole 40 MG tablet Commonly known as:  PROTONIX Take 40 mg by mouth daily.   promethazine 12.5 MG tablet Commonly known as:  PHENERGAN Take 1 tablet (12.5 mg total) by mouth every 6 (six) hours  as needed for nausea.   thyroid 120 MG tablet Commonly known as:  ARMOUR Take 90 mg by mouth See admin instructions. Take 1 tablet Mon- Fri   ARMOUR THYROID 15 MG tablet Generic drug:  thyroid TAKE 1 TABLET BY MOUTH ON AN EMPTY STOMACH ONCE A DAY   verapamil 240 MG CR tablet Commonly known as:  CALAN-SR Take 240 mg by mouth daily.       Allergies:  Allergies  Allergen Reactions  . Asa [Aspirin] Shortness Of Breath and Other (See Comments)    "cold sweats and migraines" (05/13/2012)  . Celebrex [Celecoxib] Shortness Of Breath  . Cinnamon Flavor Anaphylaxis    sneezing  . Nitroglycerin Nausea And Vomiting    Chills and migraine; patient stayed in hospital one week after given  Nitroglycerin  . Percocet [Oxycodone-Acetaminophen] Other (See Comments)    Halucinations; "and blood in my urine" (05/13/2012)  . Ultram [Tramadol] Shortness Of Breath  . Pamelor [Nortriptyline Hcl] Other (See Comments)    Jaw pain   . Codeine Nausea Only    Gives her migraines, makes sick    Past Medical History, Surgical history, Social history, and Family History were reviewed and updated.  Review of Systems: All other 10 point review of systems is negative.   Physical Exam:  vitals were not taken for this visit.  Wt Readings from Last 3 Encounters:  02/02/16 225 lb (102.1 kg)  07/12/15 226 lb (102.5 kg)  05/31/15 228 lb (103.4 kg)    Ocular: Sclerae unicteric, pupils equal, round and reactive to light Ear-nose-throat: Oropharynx clear, dentition fair Lymphatic: No cervical supraclavicular adenopathy Lungs no rales or rhonchi, good excursion bilaterally Heart regular rate and rhythm, no murmur appreciated Abd soft, nontender, positive bowel sounds, no liver or spleen tip palpated on exam  MSK no focal spinal tenderness, no joint edema Neuro: non-focal, well-oriented, appropriate affect Breasts: Deferred   Lab Results  Component Value Date   WBC 12.4 (H) 05/13/2016   HGB 16.1 (H) 05/13/2016   HCT 47.5 (H) 05/13/2016   MCV 91 05/13/2016   PLT 312 05/13/2016   Lab Results  Component Value Date   FERRITIN 97 02/02/2016   IRON 44 02/02/2016   TIBC 344 02/02/2016   UIBC 300 02/02/2016   IRONPCTSAT 13 (L) 02/02/2016   Lab Results  Component Value Date   RETICCTPCT 1.2 05/31/2015   RBC 5.22 05/13/2016   RETICCTABS 61.8 05/31/2015   No results found for: KPAFRELGTCHN, LAMBDASER, KAPLAMBRATIO No results found for: IGGSERUM, IGA, IGMSERUM No results found for: Dorene ArOTALPROTELP, ALBUMINELP, A1GS, A2GS, Colin BentonBETS, BETA2SER, GAMS, MSPIKE, SPEI   Chemistry      Component Value Date/Time   NA 139 02/02/2016 1321   K 4.5 02/02/2016 1321   CL 105 05/20/2012 0500   CO2 22  02/02/2016 1321   BUN 14.6 02/02/2016 1321   CREATININE 0.9 02/02/2016 1321      Component Value Date/Time   CALCIUM 10.3 02/02/2016 1321   ALKPHOS 87 02/02/2016 1321   AST 50 (H) 02/02/2016 1321   ALT 50 02/02/2016 1321   BILITOT 0.31 02/02/2016 1321     Impression and Plan: [name] is a 55 yo white female with a history of iron deficiency anemia and leukocytosis. No anemia or thrombocytopenia.  Hgb is elevated at 16.1 with an MCV of 91. JAK-2 is pending.  We will see what her iron studies show.  She will have her mammogram in the next week or so.  We will  plan to see her back in 3 months for repeat lab work and follow-up.  She will contact our office with any questions or concerns. We can certainly see her sooner if need be.   Verdie Mosher[place],Jamoni Broadfoot M, NP 12/11/201711:58 AM

## 2016-05-14 ENCOUNTER — Telehealth: Payer: Self-pay | Admitting: *Deleted

## 2016-05-14 LAB — IRON AND TIBC
%SAT: 19 % — ABNORMAL LOW (ref 21–57)
Iron: 65 ug/dL (ref 41–142)
TIBC: 333 ug/dL (ref 236–444)
UIBC: 268 ug/dL (ref 120–384)

## 2016-05-14 LAB — RETICULOCYTES: Reticulocyte Count: 1.6 % (ref 0.6–2.6)

## 2016-05-14 NOTE — Telephone Encounter (Addendum)
Patient aware of results. Appointment scheduled.   ----- Message from Verdie MosherSarah M [place], NP sent at 05/14/2016 10:50 AM EST ----- Regarding: Iron  Iron saturation still low. She will need one dose of IV iron this week please. Thank you!  Sarah  ----- Message ----- From: Josph MachoPeter R Ennever, MD Sent: 05/14/2016   8:54 AM To: Verdie MosherSarah M [place], NP    ----- Message ----- From: Interface, Lab In Three Zero One Sent: 05/13/2016  11:56 AM To: Josph MachoPeter R Ennever, MD

## 2016-05-15 DIAGNOSIS — G4700 Insomnia, unspecified: Secondary | ICD-10-CM | POA: Diagnosis not present

## 2016-05-15 DIAGNOSIS — G44221 Chronic tension-type headache, intractable: Secondary | ICD-10-CM | POA: Diagnosis not present

## 2016-05-15 DIAGNOSIS — G43019 Migraine without aura, intractable, without status migrainosus: Secondary | ICD-10-CM | POA: Diagnosis not present

## 2016-05-21 ENCOUNTER — Ambulatory Visit (HOSPITAL_BASED_OUTPATIENT_CLINIC_OR_DEPARTMENT_OTHER): Payer: BLUE CROSS/BLUE SHIELD

## 2016-05-21 VITALS — BP 119/71 | HR 90 | Temp 97.5°F | Resp 18

## 2016-05-21 DIAGNOSIS — K909 Intestinal malabsorption, unspecified: Secondary | ICD-10-CM

## 2016-05-21 DIAGNOSIS — R58 Hemorrhage, not elsewhere classified: Secondary | ICD-10-CM

## 2016-05-21 DIAGNOSIS — D509 Iron deficiency anemia, unspecified: Secondary | ICD-10-CM

## 2016-05-21 MED ORDER — FERUMOXYTOL INJECTION 510 MG/17 ML
510.0000 mg | Freq: Once | INTRAVENOUS | Status: AC
  Administered 2016-05-21: 510 mg via INTRAVENOUS
  Filled 2016-05-21: qty 17

## 2016-05-21 MED ORDER — SODIUM CHLORIDE 0.9 % IV SOLN
Freq: Once | INTRAVENOUS | Status: AC
  Administered 2016-05-21: 15:00:00 via INTRAVENOUS

## 2016-05-21 NOTE — Patient Instructions (Signed)
Ferumoxytol injection What is this medicine? FERUMOXYTOL is an iron complex. Iron is used to make healthy red blood cells, which carry oxygen and nutrients throughout the body. This medicine is used to treat iron deficiency anemia in people with chronic kidney disease. COMMON BRAND NAME(S): Feraheme What should I tell my health care provider before I take this medicine? They need to know if you have any of these conditions: -anemia not caused by low iron levels -high levels of iron in the blood -magnetic resonance imaging (MRI) test scheduled -an unusual or allergic reaction to iron, other medicines, foods, dyes, or preservatives -pregnant or trying to get pregnant -breast-feeding How should I use this medicine? This medicine is for injection into a vein. It is given by a health care professional in a hospital or clinic setting. Talk to your pediatrician regarding the use of this medicine in children. Special care may be needed. What if I miss a dose? It is important not to miss your dose. Call your doctor or health care professional if you are unable to keep an appointment. What may interact with this medicine? This medicine may interact with the following medications: -other iron products What should I watch for while using this medicine? Visit your doctor or healthcare professional regularly. Tell your doctor or healthcare professional if your symptoms do not start to get better or if they get worse. You may need blood work done while you are taking this medicine. You may need to follow a special diet. Talk to your doctor. Foods that contain iron include: whole grains/cereals, dried fruits, beans, or peas, leafy green vegetables, and organ meats (liver, kidney). What side effects may I notice from receiving this medicine? Side effects that you should report to your doctor or health care professional as soon as possible: -allergic reactions like skin rash, itching or hives, swelling of the  face, lips, or tongue -breathing problems -changes in blood pressure -feeling faint or lightheaded, falls -fever or chills -flushing, sweating, or hot feelings -swelling of the ankles or feet Side effects that usually do not require medical attention (report to your doctor or health care professional if they continue or are bothersome): -diarrhea -headache -nausea, vomiting -stomach pain Where should I keep my medicine? This drug is given in a hospital or clinic and will not be stored at home.  2017 Elsevier/Gold Standard (2015-06-22 12:41:49)  

## 2016-05-22 ENCOUNTER — Telehealth: Payer: Self-pay | Admitting: Family

## 2016-05-22 NOTE — Telephone Encounter (Signed)
I spoke with Briana Kelley and went over her mammogram results with her. All questions were answered and we will plan to see her back at her next scheduled follow-up.

## 2016-06-17 ENCOUNTER — Telehealth: Payer: Self-pay | Admitting: *Deleted

## 2016-06-17 NOTE — Telephone Encounter (Addendum)
Patient aware of results. Appointment made.   ----- Message from Verdie MosherSarah M [place], NP sent at 06/17/2016  2:39 PM EST ----- Regarding: iron  Needs one dose of IV iron please. Thank you!!  Maralyn SagoSarah  ----- Message ----- From: Josph MachoPeter R Ennever, MD Sent: 06/17/2016  10:20 AM To: Verdie MosherSarah M [place], NP    ----- Message ----- From: Interface, Lab In Three Zero One Sent: 05/13/2016  11:56 AM To: Josph MachoPeter R Ennever, MD

## 2016-06-18 ENCOUNTER — Other Ambulatory Visit: Payer: Self-pay | Admitting: Family

## 2016-06-25 ENCOUNTER — Ambulatory Visit (HOSPITAL_BASED_OUTPATIENT_CLINIC_OR_DEPARTMENT_OTHER): Payer: BLUE CROSS/BLUE SHIELD

## 2016-06-25 VITALS — BP 105/68 | HR 88 | Temp 98.1°F | Resp 17

## 2016-06-25 DIAGNOSIS — D509 Iron deficiency anemia, unspecified: Secondary | ICD-10-CM | POA: Diagnosis not present

## 2016-06-25 MED ORDER — SODIUM CHLORIDE 0.9 % IV SOLN
510.0000 mg | Freq: Once | INTRAVENOUS | Status: AC
  Administered 2016-06-25: 510 mg via INTRAVENOUS
  Filled 2016-06-25: qty 17

## 2016-06-25 MED ORDER — SODIUM CHLORIDE 0.9 % IV SOLN
Freq: Once | INTRAVENOUS | Status: AC
  Administered 2016-06-25: 10:00:00 via INTRAVENOUS

## 2016-06-25 NOTE — Patient Instructions (Signed)
Ferumoxytol injection What is this medicine? FERUMOXYTOL is an iron complex. Iron is used to make healthy red blood cells, which carry oxygen and nutrients throughout the body. This medicine is used to treat iron deficiency anemia in people with chronic kidney disease. COMMON BRAND NAME(S): Feraheme What should I tell my health care provider before I take this medicine? They need to know if you have any of these conditions: -anemia not caused by low iron levels -high levels of iron in the blood -magnetic resonance imaging (MRI) test scheduled -an unusual or allergic reaction to iron, other medicines, foods, dyes, or preservatives -pregnant or trying to get pregnant -breast-feeding How should I use this medicine? This medicine is for injection into a vein. It is given by a health care professional in a hospital or clinic setting. Talk to your pediatrician regarding the use of this medicine in children. Special care may be needed. What if I miss a dose? It is important not to miss your dose. Call your doctor or health care professional if you are unable to keep an appointment. What may interact with this medicine? This medicine may interact with the following medications: -other iron products What should I watch for while using this medicine? Visit your doctor or healthcare professional regularly. Tell your doctor or healthcare professional if your symptoms do not start to get better or if they get worse. You may need blood work done while you are taking this medicine. You may need to follow a special diet. Talk to your doctor. Foods that contain iron include: whole grains/cereals, dried fruits, beans, or peas, leafy green vegetables, and organ meats (liver, kidney). What side effects may I notice from receiving this medicine? Side effects that you should report to your doctor or health care professional as soon as possible: -allergic reactions like skin rash, itching or hives, swelling of the  face, lips, or tongue -breathing problems -changes in blood pressure -feeling faint or lightheaded, falls -fever or chills -flushing, sweating, or hot feelings -swelling of the ankles or feet Side effects that usually do not require medical attention (report to your doctor or health care professional if they continue or are bothersome): -diarrhea -headache -nausea, vomiting -stomach pain Where should I keep my medicine? This drug is given in a hospital or clinic and will not be stored at home.  2017 Elsevier/Gold Standard (2015-06-22 12:41:49)  

## 2016-06-28 DIAGNOSIS — E039 Hypothyroidism, unspecified: Secondary | ICD-10-CM | POA: Diagnosis not present

## 2016-07-02 DIAGNOSIS — R109 Unspecified abdominal pain: Secondary | ICD-10-CM | POA: Diagnosis not present

## 2016-07-02 DIAGNOSIS — R197 Diarrhea, unspecified: Secondary | ICD-10-CM | POA: Diagnosis not present

## 2016-07-02 DIAGNOSIS — E119 Type 2 diabetes mellitus without complications: Secondary | ICD-10-CM | POA: Diagnosis not present

## 2016-07-02 DIAGNOSIS — K5900 Constipation, unspecified: Secondary | ICD-10-CM | POA: Diagnosis not present

## 2016-07-02 DIAGNOSIS — R1084 Generalized abdominal pain: Secondary | ICD-10-CM | POA: Diagnosis not present

## 2016-07-22 DIAGNOSIS — E1165 Type 2 diabetes mellitus with hyperglycemia: Secondary | ICD-10-CM | POA: Diagnosis not present

## 2016-07-22 DIAGNOSIS — E119 Type 2 diabetes mellitus without complications: Secondary | ICD-10-CM | POA: Diagnosis not present

## 2016-08-01 ENCOUNTER — Telehealth: Payer: Self-pay | Admitting: *Deleted

## 2016-08-01 NOTE — Telephone Encounter (Addendum)
Patient aware of results. Appointment made.   ----- Message from Verdie MosherSarah M [place], NP sent at 08/01/2016 11:34 AM EST ----- Regarding: Iron  Iron saturation is low. I have a follow-up appointment with her on 3/12 and will need one dose of iron that same day please. Thank you!  Sarah  ----- Message ----- From: Josph MachoPeter R Ennever, MD Sent: 06/17/2016  10:20 AM To: Verdie MosherSarah M [place], NP    ----- Message ----- From: Interface, Lab In Three Zero One Sent: 05/13/2016  11:56 AM To: Josph MachoPeter R Ennever, MD

## 2016-08-12 ENCOUNTER — Ambulatory Visit: Payer: BLUE CROSS/BLUE SHIELD

## 2016-08-12 ENCOUNTER — Ambulatory Visit (HOSPITAL_BASED_OUTPATIENT_CLINIC_OR_DEPARTMENT_OTHER): Payer: BLUE CROSS/BLUE SHIELD | Admitting: Family

## 2016-08-12 ENCOUNTER — Telehealth: Payer: Self-pay | Admitting: *Deleted

## 2016-08-12 ENCOUNTER — Other Ambulatory Visit (HOSPITAL_BASED_OUTPATIENT_CLINIC_OR_DEPARTMENT_OTHER): Payer: BLUE CROSS/BLUE SHIELD

## 2016-08-12 VITALS — BP 128/68 | HR 94 | Temp 98.2°F | Resp 16 | Wt 210.1 lb

## 2016-08-12 DIAGNOSIS — D509 Iron deficiency anemia, unspecified: Secondary | ICD-10-CM

## 2016-08-12 DIAGNOSIS — D72829 Elevated white blood cell count, unspecified: Secondary | ICD-10-CM | POA: Diagnosis not present

## 2016-08-12 DIAGNOSIS — K909 Intestinal malabsorption, unspecified: Secondary | ICD-10-CM

## 2016-08-12 DIAGNOSIS — Z1231 Encounter for screening mammogram for malignant neoplasm of breast: Secondary | ICD-10-CM | POA: Diagnosis not present

## 2016-08-12 DIAGNOSIS — D751 Secondary polycythemia: Secondary | ICD-10-CM | POA: Diagnosis not present

## 2016-08-12 DIAGNOSIS — D508 Other iron deficiency anemias: Secondary | ICD-10-CM

## 2016-08-12 LAB — CBC WITH DIFFERENTIAL (CANCER CENTER ONLY)
BASO#: 0 10*3/uL (ref 0.0–0.2)
BASO%: 0.3 % (ref 0.0–2.0)
EOS%: 3.2 % (ref 0.0–7.0)
Eosinophils Absolute: 0.4 10*3/uL (ref 0.0–0.5)
HCT: 48.9 % — ABNORMAL HIGH (ref 34.8–46.6)
HGB: 16.5 g/dL — ABNORMAL HIGH (ref 11.6–15.9)
LYMPH#: 4.1 10*3/uL — ABNORMAL HIGH (ref 0.9–3.3)
LYMPH%: 32.3 % (ref 14.0–48.0)
MCH: 31.3 pg (ref 26.0–34.0)
MCHC: 33.7 g/dL (ref 32.0–36.0)
MCV: 93 fL (ref 81–101)
MONO#: 1 10*3/uL — ABNORMAL HIGH (ref 0.1–0.9)
MONO%: 7.8 % (ref 0.0–13.0)
NEUT#: 7.2 10*3/uL — ABNORMAL HIGH (ref 1.5–6.5)
NEUT%: 56.4 % (ref 39.6–80.0)
Platelets: 299 10*3/uL (ref 145–400)
RBC: 5.27 10*6/uL (ref 3.70–5.32)
RDW: 13.9 % (ref 11.1–15.7)
WBC: 12.7 10*3/uL — ABNORMAL HIGH (ref 3.9–10.0)

## 2016-08-12 LAB — COMPREHENSIVE METABOLIC PANEL
ALT: 38 U/L (ref 0–55)
AST: 28 U/L (ref 5–34)
Albumin: 4 g/dL (ref 3.5–5.0)
Alkaline Phosphatase: 105 U/L (ref 40–150)
Anion Gap: 13 mEq/L — ABNORMAL HIGH (ref 3–11)
BUN: 22.5 mg/dL (ref 7.0–26.0)
CO2: 23 mEq/L (ref 22–29)
Calcium: 10.7 mg/dL — ABNORMAL HIGH (ref 8.4–10.4)
Chloride: 103 mEq/L (ref 98–109)
Creatinine: 0.9 mg/dL (ref 0.6–1.1)
EGFR: 76 mL/min/{1.73_m2} — ABNORMAL LOW (ref >90)
Glucose: 228 mg/dl — ABNORMAL HIGH (ref 70–140)
Potassium: 4.2 mEq/L (ref 3.5–5.1)
Sodium: 139 mEq/L (ref 136–145)
Total Bilirubin: 0.37 mg/dL (ref 0.20–1.20)
Total Protein: 7.7 g/dL (ref 6.4–8.3)

## 2016-08-12 LAB — IRON AND TIBC
%SAT: 30 % (ref 21–57)
Iron: 88 ug/dL (ref 41–142)
TIBC: 295 ug/dL (ref 236–444)
UIBC: 207 ug/dL (ref 120–384)

## 2016-08-12 LAB — FERRITIN: Ferritin: 691 ng/ml — ABNORMAL HIGH (ref 9–269)

## 2016-08-12 LAB — CHCC SATELLITE - SMEAR

## 2016-08-12 NOTE — Telephone Encounter (Addendum)
Patient is aware of results  ----- Message from Verdie MosherSarah M [place], NP sent at 08/12/2016  2:17 PM EDT ----- Regarding: Iron  Iron studies look good. No infusion needed. Thank you!  Sarah  ----- Message ----- From: Interface, Lab In Three Zero One Sent: 08/12/2016  10:37 AM To: Verdie MosherSarah M [place], NP

## 2016-08-12 NOTE — Progress Notes (Signed)
Hematology and Oncology Follow Up Visit  Briana Kelley [id number] 02/11/1961 56 y.o. 08/12/2016   Principle Diagnosis:  Leukocytosis Iron deficiency anemia   Current Therapy:   IV iron as indicated - last received in January 2018    Interim History:  Ms. Briana Kelley is here today for follow-up. She is having some fatigue. She received iron in January. Iron studies today are pending.  Her WBC count is stable at 12.7, Hgb 16.5. She has had no problem with frequent infections. She stopped taking her vitamin D in September and will restart today.  No fever, chills, n/v, cough, oral sores, rash, dizziness, SOB, chest pain, palpitations or changes in bowel or bladder habits.  She has occasional constipation and will drink more fluids to help with this as needed.  She has chronic migraines and is effectively treated for this.  No lymphadenopathy found on exam. No episodes of bleeding, bruising or petechiae.  Her appetite comes and goes which is not a new issue for her. She is staying hydrated. Her weight is stable.  Her blood sugars are fairly well controlled on Jardiance and Amaryl. Hgb A1c was 8.2 in January.  Medications:  Allergies as of 08/12/2016      Reactions   Asa [aspirin] Shortness Of Breath, Other (See Comments)   "cold sweats and migraines" (05/13/2012)   Celebrex [celecoxib] Shortness Of Breath   Cinnamon Flavor Anaphylaxis   sneezing   Nitroglycerin Nausea And Vomiting   Chills and migraine; patient stayed in hospital one week after given Nitroglycerin   Percocet [oxycodone-acetaminophen] Other (See Comments)   Halucinations; "and blood in my urine" (05/13/2012)   Ultram [tramadol] Shortness Of Breath   Pamelor [nortriptyline Hcl] Other (See Comments)   Jaw pain   Codeine Nausea Only   Gives her migraines, makes sick      Medication List       Accurate as of 08/12/16 11:32 AM. Always use your most recent med list.          Alogliptin-Metformin HCl 12.10-998 MG  Tabs Take 12.5 mg by mouth 2 (two) times daily. 12.5-1000mg    amitriptyline 25 MG tablet Commonly known as:  ELAVIL Take 75 mg by mouth at bedtime.   atorvastatin 10 MG tablet Commonly known as:  LIPITOR Take 10 mg by mouth daily.   baclofen 10 MG tablet Commonly known as:  LIORESAL TAKE 1 TABLET THREE TIMES A DAY AS NEEDED MUSCLE TIGHTNESS   butalbital-acetaminophen-caffeine 50-325-40-30 MG capsule Commonly known as:  FIORICET WITH CODEINE Take 1 capsule by mouth every 4 (four) hours as needed. For headache   cholecalciferol 1000 units tablet Commonly known as:  VITAMIN D Take 5,000 Units by mouth 2 (two) times daily.   FLAX SEED OIL PO Take 2 tablets by mouth daily.   glimepiride 2 MG tablet Commonly known as:  AMARYL Take 4 mg by mouth 2 (two) times daily.   ibuprofen 600 MG tablet Commonly known as:  ADVIL,MOTRIN Take 1 tablet (600 mg total) by mouth 3 (three) times daily as needed for pain.   JARDIANCE 10 MG Tabs tablet Generic drug:  empagliflozin Take 10 mg by mouth daily.   lisinopril 5 MG tablet Commonly known as:  PRINIVIL,ZESTRIL Take 5 mg by mouth daily.   naratriptan 2.5 MG tablet Commonly known as:  AMERGE Take 2.5 mg by mouth daily as needed. Take one (1) tablet at onset of headache; if returns or does not resolve, may repeat after 4 hours; do not exceed  five (5) mg in 24 hours.   ondansetron 8 MG tablet Commonly known as:  ZOFRAN   pantoprazole 40 MG tablet Commonly known as:  PROTONIX Take 40 mg by mouth daily.   promethazine 12.5 MG tablet Commonly known as:  PHENERGAN Take 1 tablet (12.5 mg total) by mouth every 6 (six) hours as needed for nausea.   thyroid 120 MG tablet Commonly known as:  ARMOUR Take 90 mg by mouth See admin instructions. Take 1 tablet Mon- Fri   ARMOUR THYROID 15 MG tablet Generic drug:  thyroid TAKE 1 TABLET BY MOUTH ON AN EMPTY STOMACH ONCE A DAY   verapamil 240 MG CR tablet Commonly known as:  CALAN-SR Take  240 mg by mouth 2 (two) times daily.       Allergies:  Allergies  Allergen Reactions  . Asa [Aspirin] Shortness Of Breath and Other (See Comments)    "cold sweats and migraines" (05/13/2012)  . Celebrex [Celecoxib] Shortness Of Breath  . Cinnamon Flavor Anaphylaxis    sneezing  . Nitroglycerin Nausea And Vomiting    Chills and migraine; patient stayed in hospital one week after given Nitroglycerin  . Percocet [Oxycodone-Acetaminophen] Other (See Comments)    Halucinations; "and blood in my urine" (05/13/2012)  . Ultram [Tramadol] Shortness Of Breath  . Pamelor [Nortriptyline Hcl] Other (See Comments)    Jaw pain   . Codeine Nausea Only    Gives her migraines, makes sick    Past Medical History, Surgical history, Social history, and Family History were reviewed and updated.  Review of Systems: All other 10 point review of systems is negative.   Physical Exam:  weight is 210 lb 1.9 oz (95.3 kg). Her oral temperature is 98.2 F (36.8 C). Her blood pressure is 128/68 and her pulse is 94. Her respiration is 16 and oxygen saturation is 95%.   Wt Readings from Last 3 Encounters:  08/12/16 210 lb 1.9 oz (95.3 kg)  05/13/16 216 lb (98 kg)  02/02/16 225 lb (102.1 kg)    Ocular: Sclerae unicteric, pupils equal, round and reactive to light Ear-nose-throat: Oropharynx clear, dentition fair Lymphatic: No cervical, supraclavicular or axillary adenopathy Lungs no rales or rhonchi, good excursion bilaterally Heart regular rate and rhythm, no murmur appreciated Abd soft, nontender, positive bowel sounds, no liver or spleen tip palpated on exam, no fluid wave MSK no focal spinal tenderness, no joint edema Neuro: non-focal, well-oriented, appropriate affect Breasts: Deferred   Lab Results  Component Value Date   WBC 12.7 (H) 08/12/2016   HGB 16.5 (H) 08/12/2016   HCT 48.9 (H) 08/12/2016   MCV 93 08/12/2016   PLT 299 08/12/2016   Lab Results  Component Value Date   FERRITIN 97  02/02/2016   IRON 65 05/13/2016   TIBC 333 05/13/2016   UIBC 268 05/13/2016   IRONPCTSAT 19 (L) 05/13/2016   Lab Results  Component Value Date   RETICCTPCT 1.2 05/31/2015   RBC 5.27 08/12/2016   RETICCTABS 61.8 05/31/2015   No results found for: KPAFRELGTCHN, LAMBDASER, KAPLAMBRATIO No results found for: IGGSERUM, IGA, IGMSERUM No results found for: Dorene Ar, A1GS, A2GS, Colin Benton, MSPIKE, SPEI   Chemistry      Component Value Date/Time   NA 138 05/13/2016 1141   K 4.2 05/13/2016 1141   CL 105 05/20/2012 0500   CO2 24 05/13/2016 1141   BUN 17.9 05/13/2016 1141   CREATININE 0.9 05/13/2016 1141      Component Value Date/Time  CALCIUM 10.3 05/13/2016 1141   ALKPHOS 94 05/13/2016 1141   AST 32 05/13/2016 1141   ALT 49 05/13/2016 1141   BILITOT 0.45 05/13/2016 1141     Impression and Plan: Ms. Service is a 56 yo white female with a history of iron deficiency anemia and leukocytosis. She received IV iron in January and her Hgb today is stable at 16.5. Her WBC count is 12.7 and she has had no issue with infections. She has had some fatigue.  She will restart her vitamin D and see if this helps.  We will see what her iron studies show and bring her back in later this week for an infusion needed.  We will go ahead and plan to see her back in 3 months for repeat lab work and follow-up.  She will contact our office with any questions or concerns. We can certainly see her sooner if need be.   Verdie Mosher, NP 3/12/201811:32 AM

## 2016-08-13 LAB — RETICULOCYTES: Reticulocyte Count: 1.5 % (ref 0.6–2.6)

## 2016-08-14 DIAGNOSIS — G44229 Chronic tension-type headache, not intractable: Secondary | ICD-10-CM | POA: Diagnosis not present

## 2016-08-14 DIAGNOSIS — G43009 Migraine without aura, not intractable, without status migrainosus: Secondary | ICD-10-CM | POA: Diagnosis not present

## 2016-08-14 DIAGNOSIS — G4700 Insomnia, unspecified: Secondary | ICD-10-CM | POA: Diagnosis not present

## 2016-09-30 DIAGNOSIS — E039 Hypothyroidism, unspecified: Secondary | ICD-10-CM | POA: Diagnosis not present

## 2016-10-21 DIAGNOSIS — E039 Hypothyroidism, unspecified: Secondary | ICD-10-CM | POA: Diagnosis not present

## 2016-10-21 DIAGNOSIS — E1165 Type 2 diabetes mellitus with hyperglycemia: Secondary | ICD-10-CM | POA: Diagnosis not present

## 2016-10-21 DIAGNOSIS — E119 Type 2 diabetes mellitus without complications: Secondary | ICD-10-CM | POA: Diagnosis not present

## 2016-10-21 DIAGNOSIS — D72829 Elevated white blood cell count, unspecified: Secondary | ICD-10-CM | POA: Diagnosis not present

## 2016-10-21 DIAGNOSIS — Z23 Encounter for immunization: Secondary | ICD-10-CM | POA: Diagnosis not present

## 2016-10-21 DIAGNOSIS — E785 Hyperlipidemia, unspecified: Secondary | ICD-10-CM | POA: Diagnosis not present

## 2016-11-18 ENCOUNTER — Other Ambulatory Visit (HOSPITAL_BASED_OUTPATIENT_CLINIC_OR_DEPARTMENT_OTHER): Payer: BLUE CROSS/BLUE SHIELD

## 2016-11-18 ENCOUNTER — Ambulatory Visit (HOSPITAL_BASED_OUTPATIENT_CLINIC_OR_DEPARTMENT_OTHER): Payer: BLUE CROSS/BLUE SHIELD | Admitting: Hematology & Oncology

## 2016-11-18 VITALS — BP 121/73 | HR 92 | Temp 98.8°F | Resp 18 | Wt 214.0 lb

## 2016-11-18 DIAGNOSIS — D509 Iron deficiency anemia, unspecified: Secondary | ICD-10-CM

## 2016-11-18 DIAGNOSIS — K909 Intestinal malabsorption, unspecified: Secondary | ICD-10-CM

## 2016-11-18 DIAGNOSIS — D508 Other iron deficiency anemias: Secondary | ICD-10-CM

## 2016-11-18 DIAGNOSIS — R42 Dizziness and giddiness: Secondary | ICD-10-CM

## 2016-11-18 DIAGNOSIS — D72829 Elevated white blood cell count, unspecified: Secondary | ICD-10-CM | POA: Diagnosis not present

## 2016-11-18 DIAGNOSIS — D500 Iron deficiency anemia secondary to blood loss (chronic): Secondary | ICD-10-CM

## 2016-11-18 LAB — IRON AND TIBC
%SAT: 28 % (ref 21–57)
Iron: 91 ug/dL (ref 41–142)
TIBC: 320 ug/dL (ref 236–444)
UIBC: 229 ug/dL (ref 120–384)

## 2016-11-18 LAB — COMPREHENSIVE METABOLIC PANEL
ALT: 26 U/L (ref 0–55)
AST: 20 U/L (ref 5–34)
Albumin: 3.8 g/dL (ref 3.5–5.0)
Alkaline Phosphatase: 88 U/L (ref 40–150)
Anion Gap: 10 mEq/L (ref 3–11)
BUN: 18.1 mg/dL (ref 7.0–26.0)
CO2: 25 mEq/L (ref 22–29)
Calcium: 10.6 mg/dL — ABNORMAL HIGH (ref 8.4–10.4)
Chloride: 100 mEq/L (ref 98–109)
Creatinine: 0.8 mg/dL (ref 0.6–1.1)
EGFR: 83 mL/min/{1.73_m2} — ABNORMAL LOW (ref >90)
Glucose: 213 mg/dl — ABNORMAL HIGH (ref 70–140)
Potassium: 4.8 mEq/L (ref 3.5–5.1)
Sodium: 135 mEq/L — ABNORMAL LOW (ref 136–145)
Total Bilirubin: 0.33 mg/dL (ref 0.20–1.20)
Total Protein: 7.5 g/dL (ref 6.4–8.3)

## 2016-11-18 LAB — CBC WITH DIFFERENTIAL (CANCER CENTER ONLY)
BASO#: 0 10*3/uL (ref 0.0–0.2)
BASO%: 0.2 % (ref 0.0–2.0)
EOS%: 2 % (ref 0.0–7.0)
Eosinophils Absolute: 0.3 10*3/uL (ref 0.0–0.5)
HCT: 45.7 % (ref 34.8–46.6)
HGB: 15.5 g/dL (ref 11.6–15.9)
LYMPH#: 3.5 10*3/uL — ABNORMAL HIGH (ref 0.9–3.3)
LYMPH%: 26.5 % (ref 14.0–48.0)
MCH: 31.5 pg (ref 26.0–34.0)
MCHC: 33.9 g/dL (ref 32.0–36.0)
MCV: 93 fL (ref 81–101)
MONO#: 0.8 10*3/uL (ref 0.1–0.9)
MONO%: 5.7 % (ref 0.0–13.0)
NEUT#: 8.7 10*3/uL — ABNORMAL HIGH (ref 1.5–6.5)
NEUT%: 65.6 % (ref 39.6–80.0)
Platelets: 308 10*3/uL (ref 145–400)
RBC: 4.92 10*6/uL (ref 3.70–5.32)
RDW: 12.8 % (ref 11.1–15.7)
WBC: 13.2 10*3/uL — ABNORMAL HIGH (ref 3.9–10.0)

## 2016-11-18 LAB — FERRITIN: Ferritin: 488 ng/ml — ABNORMAL HIGH (ref 9–269)

## 2016-11-18 NOTE — Progress Notes (Signed)
Hematology and Oncology Follow Up Visit  Briana BuddsCynthia H Kelley [id number] 02/27/1961 56 y.o. 11/18/2016   Principle Diagnosis:  Leukocytosis Iron deficiency anemia   Current Therapy:   IV iron as indicated - last received in January 2018    Interim History:  Briana Kelley is here today for follow-up. She is doing okay. She does not have a lot of plan for the summer.  Her main complaint has been some occasional dizziness. Not sure exactly what could be going on with this. She thinks it might be related to migraines. She says the dizziness occurs at any time. She does not think her blood sugars are low. She says these episodes last a few minutes. There is no visual changes. She has no double vision. She will see her family doctor about this next month.  Her skin is incredibly sensitive to the sun. She really cannot go out into the sun.  Back in March, her ferritin was 691 with an iron saturation of 30%.  She says her last hemoglobin A1c was 7.5.  She says is no problems going to the bathroom. She's had no cough. She's had no fever. She's had no rashes outside of that associated with the sun.  Overall, performance status is ECOG 0.  Medications:  Allergies as of 11/18/2016      Reactions   Asa [aspirin] Shortness Of Breath, Other (See Comments)   "cold sweats and migraines" (05/13/2012)   Celebrex [celecoxib] Shortness Of Breath   Cinnamon Flavor Anaphylaxis   sneezing   Nitroglycerin Nausea And Vomiting   Chills and migraine; patient stayed in hospital one week after given Nitroglycerin   Percocet [oxycodone-acetaminophen] Other (See Comments)   Halucinations; "and blood in my urine" (05/13/2012)   Ultram [tramadol] Shortness Of Breath   Pamelor [nortriptyline Hcl] Other (See Comments)   Jaw pain   Codeine Nausea Only   Gives her migraines, makes sick      Medication List       Accurate as of 11/18/16 11:41 AM. Always use your most recent med list.            Alogliptin-Metformin HCl 12.10-998 MG Tabs Take 12.5 mg by mouth 2 (two) times daily. 12.5-1000mg    amitriptyline 25 MG tablet Commonly known as:  ELAVIL Take 75 mg by mouth at bedtime.   atorvastatin 20 MG tablet Commonly known as:  LIPITOR Take 20 mg by mouth daily.   baclofen 10 MG tablet Commonly known as:  LIORESAL TAKE 1 TABLET THREE TIMES A DAY AS NEEDED MUSCLE TIGHTNESS   butalbital-acetaminophen-caffeine 50-325-40-30 MG capsule Commonly known as:  FIORICET WITH CODEINE Take 1 capsule by mouth every 4 (four) hours as needed. For headache   cholecalciferol 1000 units tablet Commonly known as:  VITAMIN D Take 5,000 Units by mouth 2 (two) times daily.   FLAX SEED OIL PO Take 2 tablets by mouth daily.   glimepiride 2 MG tablet Commonly known as:  AMARYL Take 4 mg by mouth 2 (two) times daily.   ibuprofen 600 MG tablet Commonly known as:  ADVIL,MOTRIN Take 1 tablet (600 mg total) by mouth 3 (three) times daily as needed for pain.   JARDIANCE 10 MG Tabs tablet Generic drug:  empagliflozin Take 10 mg by mouth daily.   JENTADUETO 2.10-998 MG Tabs Generic drug:  Linagliptin-Metformin HCl TK 1 T PO BID WC   lisinopril 5 MG tablet Commonly known as:  PRINIVIL,ZESTRIL Take 5 mg by mouth daily.   naratriptan 2.5 MG tablet  Commonly known as:  AMERGE Take 2.5 mg by mouth daily as needed. Take one (1) tablet at onset of headache; if returns or does not resolve, may repeat after 4 hours; do not exceed five (5) mg in 24 hours.   ondansetron 8 MG tablet Commonly known as:  ZOFRAN   pantoprazole 40 MG tablet Commonly known as:  PROTONIX Take 40 mg by mouth daily.   promethazine 12.5 MG tablet Commonly known as:  PHENERGAN Take 1 tablet (12.5 mg total) by mouth every 6 (six) hours as needed for nausea.   thyroid 120 MG tablet Commonly known as:  ARMOUR Take 90 mg by mouth See admin instructions. Take 1 tablet Mon- Fri   ARMOUR THYROID 15 MG tablet Generic drug:   thyroid TAKE 1 TABLET BY MOUTH ON AN EMPTY STOMACH ONCE A DAY   verapamil 240 MG CR tablet Commonly known as:  CALAN-SR Take 240 mg by mouth 2 (two) times daily.       Allergies:  Allergies  Allergen Reactions  . Asa [Aspirin] Shortness Of Breath and Other (See Comments)    "cold sweats and migraines" (05/13/2012)  . Celebrex [Celecoxib] Shortness Of Breath  . Cinnamon Flavor Anaphylaxis    sneezing  . Nitroglycerin Nausea And Vomiting    Chills and migraine; patient stayed in hospital one week after given Nitroglycerin  . Percocet [Oxycodone-Acetaminophen] Other (See Comments)    Halucinations; "and blood in my urine" (05/13/2012)  . Ultram [Tramadol] Shortness Of Breath  . Pamelor [Nortriptyline Hcl] Other (See Comments)    Jaw pain   . Codeine Nausea Only    Gives her migraines, makes sick    Past Medical History, Surgical history, Social history, and Family History were reviewed and updated.  Review of Systems: All other 10 point review of systems is negative.   Physical Exam:  weight is 214 lb (97.1 kg). Her oral temperature is 98.8 F (37.1 C). Her blood pressure is 121/73 and her pulse is 92. Her respiration is 18 and oxygen saturation is 96%.   Wt Readings from Last 3 Encounters:  11/18/16 214 lb (97.1 kg)  08/12/16 210 lb 1.9 oz (95.3 kg)  05/13/16 216 lb (98 kg)    Ocular: Sclerae unicteric, pupils equal, round and reactive to light Ear-nose-throat: Oropharynx clear, dentition fair Lymphatic: No cervical, supraclavicular or axillary adenopathy Lungs no rales or rhonchi, good excursion bilaterally Heart regular rate and rhythm, no murmur appreciated Abd soft, nontender, positive bowel sounds, no liver or spleen tip palpated on exam, no fluid wave MSK no focal spinal tenderness, no joint edema Neuro: non-focal, well-oriented, appropriate affect Breasts: Deferred   Lab Results  Component Value Date   WBC 13.2 (H) 11/18/2016   HGB 15.5 11/18/2016   HCT  45.7 11/18/2016   MCV 93 11/18/2016   PLT 308 11/18/2016   Lab Results  Component Value Date   FERRITIN 691 (H) 08/12/2016   IRON 88 08/12/2016   TIBC 295 08/12/2016   UIBC 207 08/12/2016   IRONPCTSAT 30 08/12/2016   Lab Results  Component Value Date   RETICCTPCT 1.2 05/31/2015   RBC 4.92 11/18/2016   RETICCTABS 61.8 05/31/2015   No results found for: KPAFRELGTCHN, LAMBDASER, KAPLAMBRATIO No results found for: IGGSERUM, IGA, IGMSERUM No results found for: TOTALPROTELP, ALBUMINELP, A1GS, A2GS, BETS, BETA2SER, GAMS, MSPIKE, SPEI   Chemistry      Component Value Date/Time   NA 139 08/12/2016 1020   K 4.2 08/12/2016 1020   CL  105 05/20/2012 0500   CO2 23 08/12/2016 1020   BUN 22.5 08/12/2016 1020   CREATININE 0.9 08/12/2016 1020      Component Value Date/Time   CALCIUM 10.7 (H) 08/12/2016 1020   ALKPHOS 105 08/12/2016 1020   AST 28 08/12/2016 1020   ALT 38 08/12/2016 1020   BILITOT 0.37 08/12/2016 1020     Impression and Plan: Ms. Freimark is a 56 yo white female with a history of iron deficiency anemia and leukocytosis. She received IV iron in January and her Hgb today is stable.  We will have to see what her iron studies show.  She has a slight leukocytosis. I really don't think this is an issue for [place].  We will plan to get her back in 6 months. I think this would be a good time frame.  Hopefully this dizziness will improve.    Josph Macho, MD 6/18/201811:41 AM

## 2016-11-19 ENCOUNTER — Telehealth: Payer: Self-pay | Admitting: *Deleted

## 2016-11-19 LAB — RETICULOCYTES: Reticulocyte Count: 1.1 % (ref 0.6–2.6)

## 2016-11-19 NOTE — Telephone Encounter (Addendum)
Message left on personal voice  ----- Message from Verdie MosherSarah M [place], NP sent at 11/18/2016  4:27 PM EDT ----- Regarding: iron  Iron studies are stable. No infusion needed at this time. Thank you!  Sarah   ----- Message ----- From: Interface, Lab In Three Zero One Sent: 11/18/2016  10:41 AM To: Verdie MosherSarah M [place], NP

## 2016-11-21 DIAGNOSIS — Z23 Encounter for immunization: Secondary | ICD-10-CM | POA: Diagnosis not present

## 2016-12-17 DIAGNOSIS — G44229 Chronic tension-type headache, not intractable: Secondary | ICD-10-CM | POA: Diagnosis not present

## 2016-12-17 DIAGNOSIS — G43009 Migraine without aura, not intractable, without status migrainosus: Secondary | ICD-10-CM | POA: Diagnosis not present

## 2017-02-21 DIAGNOSIS — J069 Acute upper respiratory infection, unspecified: Secondary | ICD-10-CM | POA: Diagnosis not present

## 2017-02-21 DIAGNOSIS — E119 Type 2 diabetes mellitus without complications: Secondary | ICD-10-CM | POA: Diagnosis not present

## 2017-02-21 DIAGNOSIS — E785 Hyperlipidemia, unspecified: Secondary | ICD-10-CM | POA: Diagnosis not present

## 2017-02-21 DIAGNOSIS — Z23 Encounter for immunization: Secondary | ICD-10-CM | POA: Diagnosis not present

## 2017-04-03 DIAGNOSIS — E039 Hypothyroidism, unspecified: Secondary | ICD-10-CM | POA: Diagnosis not present

## 2017-05-19 ENCOUNTER — Other Ambulatory Visit: Payer: BLUE CROSS/BLUE SHIELD

## 2017-05-19 ENCOUNTER — Encounter: Payer: Self-pay | Admitting: Hematology & Oncology

## 2017-05-19 ENCOUNTER — Ambulatory Visit (HOSPITAL_BASED_OUTPATIENT_CLINIC_OR_DEPARTMENT_OTHER): Payer: BLUE CROSS/BLUE SHIELD | Admitting: Hematology & Oncology

## 2017-05-19 ENCOUNTER — Other Ambulatory Visit: Payer: Self-pay

## 2017-05-19 VITALS — BP 130/82 | HR 96 | Temp 98.4°F | Resp 18 | Wt 214.0 lb

## 2017-05-19 DIAGNOSIS — D72829 Elevated white blood cell count, unspecified: Secondary | ICD-10-CM

## 2017-05-19 DIAGNOSIS — Z862 Personal history of diseases of the blood and blood-forming organs and certain disorders involving the immune mechanism: Secondary | ICD-10-CM | POA: Diagnosis not present

## 2017-05-19 DIAGNOSIS — D500 Iron deficiency anemia secondary to blood loss (chronic): Secondary | ICD-10-CM

## 2017-05-19 DIAGNOSIS — G4733 Obstructive sleep apnea (adult) (pediatric): Secondary | ICD-10-CM

## 2017-05-19 LAB — CBC WITH DIFFERENTIAL (CANCER CENTER ONLY)
BASO#: 0 10*3/uL (ref 0.0–0.2)
BASO%: 0.3 % (ref 0.0–2.0)
EOS%: 4.4 % (ref 0.0–7.0)
Eosinophils Absolute: 0.6 10*3/uL — ABNORMAL HIGH (ref 0.0–0.5)
HCT: 45.1 % (ref 34.8–46.6)
HGB: 15.4 g/dL (ref 11.6–15.9)
LYMPH#: 3.6 10*3/uL — ABNORMAL HIGH (ref 0.9–3.3)
LYMPH%: 26.5 % (ref 14.0–48.0)
MCH: 31 pg (ref 26.0–34.0)
MCHC: 34.1 g/dL (ref 32.0–36.0)
MCV: 91 fL (ref 81–101)
MONO#: 1 10*3/uL — ABNORMAL HIGH (ref 0.1–0.9)
MONO%: 7.6 % (ref 0.0–13.0)
NEUT#: 8.4 10*3/uL — ABNORMAL HIGH (ref 1.5–6.5)
NEUT%: 61.2 % (ref 39.6–80.0)
Platelets: 299 10*3/uL (ref 145–400)
RBC: 4.97 10*6/uL (ref 3.70–5.32)
RDW: 12.7 % (ref 11.1–15.7)
WBC: 13.7 10*3/uL — ABNORMAL HIGH (ref 3.9–10.0)

## 2017-05-19 LAB — IRON AND TIBC
%SAT: 24 % (ref 21–57)
Iron: 70 ug/dL (ref 41–142)
TIBC: 295 ug/dL (ref 236–444)
UIBC: 225 ug/dL (ref 120–384)

## 2017-05-19 LAB — FERRITIN: Ferritin: 523 ng/ml — ABNORMAL HIGH (ref 9–269)

## 2017-05-19 NOTE — Progress Notes (Signed)
Hematology and Oncology Follow Up Visit  Briana Kelley [id number] Aug 27, 1960 56 y.o. 05/19/2017   Principle Diagnosis:  Leukocytosis Iron deficiency anemia   Current Therapy:   IV iron as indicated - last received in January 2018    Interim History:  Ms. Scruton is here today for follow-up.  She is doing pretty well.  She got through the snowstorm last week.  She had to work from home.  We saw her back in June, her ferritin was 488 with an iron saturation of 28%.  She had no problems with rashes.  She had no fever.  She has had no nausea or vomiting.  She has had no cough.  She has had no leg swelling.  She has had no bleeding.  She has had no chest wall pain.  She had no palpable lymph nodes.    Overall, her performance status is ECOG 0.    Medications:  Allergies as of 05/19/2017      Reactions   Aspirin Shortness Of Breath, Other (See Comments)   "cold sweats and migraines" (05/13/2012) Cold sweats and shakes. Also gets migraine   Celecoxib Shortness Of Breath   Cold sweats and shakes. Also get migraine   Cinnamon Shortness Of Breath   Cinnamon Flavor Anaphylaxis   sneezing   Codeine Nausea Only, Nausea And Vomiting, Shortness Of Breath   Gives her migraines, makes sick   Nitroglycerin Nausea And Vomiting   Chills and migraine; patient stayed in hospital one week after given Nitroglycerin   Nortriptyline Shortness Of Breath   Jaw pain   Oxycodone-acetaminophen Shortness Of Breath   Cold sweats and shakes. Hallucinations and bleeding   Percocet [oxycodone-acetaminophen] Other (See Comments)   Halucinations; "and blood in my urine" (05/13/2012)   Sumatriptan Shortness Of Breath   Ultram [tramadol] Shortness Of Breath   Pamelor [nortriptyline Hcl] Other (See Comments)   Jaw pain   2,4-d Dimethylamine (amisol)    Cold sweats and shakes.    Tramadol Hcl    Cold sweats and shakes. Also gets migraine      Medication List        Accurate as of 05/19/17 10:41 AM.  Always use your most recent med list.          Alogliptin-Metformin HCl 12.10-998 MG Tabs Take 12.5 mg by mouth 2 (two) times daily. 12.5-1000mg    amitriptyline 25 MG tablet Commonly known as:  ELAVIL Take 75 mg by mouth at bedtime.   atorvastatin 20 MG tablet Commonly known as:  LIPITOR Take 20 mg by mouth daily.   baclofen 10 MG tablet Commonly known as:  LIORESAL TAKE 1 TABLET THREE TIMES A DAY AS NEEDED MUSCLE TIGHTNESS   butalbital-acetaminophen-caffeine 50-325-40-30 MG capsule Commonly known as:  FIORICET WITH CODEINE Take 1 capsule by mouth every 4 (four) hours as needed. For headache   cholecalciferol 1000 units tablet Commonly known as:  VITAMIN D Take 5,000 Units by mouth 2 (two) times daily.   FLAX SEED OIL PO Take 2 tablets by mouth daily.   glimepiride 2 MG tablet Commonly known as:  AMARYL Take 4 mg by mouth 2 (two) times daily.   glimepiride 4 MG tablet Commonly known as:  AMARYL   ibuprofen 600 MG tablet Commonly known as:  ADVIL,MOTRIN Take 1 tablet (600 mg total) by mouth 3 (three) times daily as needed for pain.   JARDIANCE 10 MG Tabs tablet Generic drug:  empagliflozin Take 10 mg by mouth daily.   JENTADUETO 2.10-998  MG Tabs Generic drug:  Linagliptin-Metformin HCl TK 1 T PO BID WC   Levothyroxine-Liothyronine 90 MG Tabs 1 tablet by mouth in addition to 1 tablet of Armour Thyroid 15 mg daily.  Total daily dose = 105 mg   Levothyroxine-Liothyronine 15 MG Tabs 1 tablet by mouth in addition to 1 tablet of Armour Thyroid 90mg  daily.  Total daily dose = 105 mg   lisinopril 5 MG tablet Commonly known as:  PRINIVIL,ZESTRIL Take 5 mg by mouth daily.   naratriptan 2.5 MG tablet Commonly known as:  AMERGE Take 2.5 mg by mouth daily as needed. Take one (1) tablet at onset of headache; if returns or does not resolve, may repeat after 4 hours; do not exceed five (5) mg in 24 hours.   omeprazole 40 MG capsule Commonly known as:  PRILOSEC Take by  mouth.   ondansetron 8 MG tablet Commonly known as:  ZOFRAN   promethazine 12.5 MG tablet Commonly known as:  PHENERGAN Take 1 tablet (12.5 mg total) by mouth every 6 (six) hours as needed for nausea.   sitaGLIPtin-metformin 50-1000 MG tablet Commonly known as:  JANUMET Take by mouth.   verapamil 240 MG CR tablet Commonly known as:  CALAN-SR Take 240 mg by mouth 2 (two) times daily.       Allergies:  Allergies  Allergen Reactions  . Aspirin Shortness Of Breath and Other (See Comments)    "cold sweats and migraines" (05/13/2012) Cold sweats and shakes. Also gets migraine   . Celecoxib Shortness Of Breath    Cold sweats and shakes. Also get migraine   . Cinnamon Shortness Of Breath  . Cinnamon Flavor Anaphylaxis    sneezing  . Codeine Nausea Only, Nausea And Vomiting and Shortness Of Breath    Gives her migraines, makes sick  . Nitroglycerin Nausea And Vomiting    Chills and migraine; patient stayed in hospital one week after given Nitroglycerin  . Nortriptyline Shortness Of Breath    Jaw pain  . Oxycodone-Acetaminophen Shortness Of Breath    Cold sweats and shakes. Hallucinations and bleeding   . Percocet [Oxycodone-Acetaminophen] Other (See Comments)    Halucinations; "and blood in my urine" (05/13/2012)  . Sumatriptan Shortness Of Breath  . Ultram [Tramadol] Shortness Of Breath  . Pamelor [Nortriptyline Hcl] Other (See Comments)    Jaw pain   . 2,4-D Dimethylamine (Amisol)     Cold sweats and shakes.   . Tramadol Hcl     Cold sweats and shakes. Also gets migraine    Past Medical History, Surgical history, Social history, and Family History were reviewed and updated.  Review of Systems: Review of Systems  All other systems reviewed and are negative.    Physical Exam:  weight is 214 lb (97.1 kg). Her oral temperature is 98.4 F (36.9 C). Her blood pressure is 130/82 and her pulse is 96. Her respiration is 18 and oxygen saturation is 96%.   Wt Readings  from Last 3 Encounters:  05/19/17 214 lb (97.1 kg)  11/18/16 214 lb (97.1 kg)  08/12/16 210 lb 1.9 oz (95.3 kg)    Physical Exam  Constitutional: She is oriented to person, place, and time.  HENT:  Head: Normocephalic and atraumatic.  Mouth/Throat: Oropharynx is clear and moist.  Eyes: EOM are normal. Pupils are equal, round, and reactive to light.  Neck: Normal range of motion.  Cardiovascular: Normal rate, regular rhythm and normal heart sounds.  Pulmonary/Chest: Effort normal and breath sounds normal.  Abdominal:  Soft. Bowel sounds are normal.  Musculoskeletal: Normal range of motion. She exhibits no edema, tenderness or deformity.  Lymphadenopathy:    She has no cervical adenopathy.  Neurological: She is alert and oriented to person, place, and time.  Skin: Skin is warm and dry. No rash noted. No erythema.  Psychiatric: She has a normal mood and affect. Her behavior is normal. Judgment and thought content normal.  Vitals reviewed.    Lab Results  Component Value Date   WBC 13.7 (H) 05/19/2017   HGB 15.4 05/19/2017   HCT 45.1 05/19/2017   MCV 91 05/19/2017   PLT 299 05/19/2017   Lab Results  Component Value Date   FERRITIN 488 (H) 11/18/2016   IRON 91 11/18/2016   TIBC 320 11/18/2016   UIBC 229 11/18/2016   IRONPCTSAT 28 11/18/2016   Lab Results  Component Value Date   RETICCTPCT 1.2 05/31/2015   RBC 4.97 05/19/2017   RETICCTABS 61.8 05/31/2015   No results found for: KPAFRELGTCHN, LAMBDASER, KAPLAMBRATIO No results found for: IGGSERUM, IGA, IGMSERUM No results found for: TOTALPROTELP, ALBUMINELP, A1GS, A2GS, BETS, BETA2SER, GAMS, MSPIKE, SPEI   Chemistry      Component Value Date/Time   NA 135 (L) 11/18/2016 1029   K 4.8 11/18/2016 1029   CL 105 05/20/2012 0500   CO2 25 11/18/2016 1029   BUN 18.1 11/18/2016 1029   CREATININE 0.8 11/18/2016 1029      Component Value Date/Time   CALCIUM 10.6 (H) 11/18/2016 1029   ALKPHOS 88 11/18/2016 1029   AST 20  11/18/2016 1029   ALT 26 11/18/2016 1029   BILITOT 0.33 11/18/2016 1029     Impression and Plan: [name] is a 56 yo white female with a history of iron deficiency anemia and leukocytosis. She received IV iron in January and her Hgb today is stable.  Her white cell count slowly is trending upward.  I looked at her blood under the microscope.  I do not see anything that looked suspicious.  I would be surprised if she needed any iron.  We will get her back in 6 months.  I think this would be reasonable for follow-up.   Josph MachoPeter R Atticus Wedin, MD 12/17/201810:41 AM

## 2017-05-20 ENCOUNTER — Telehealth: Payer: Self-pay | Admitting: *Deleted

## 2017-05-20 NOTE — Telephone Encounter (Addendum)
Patient is aware of results  ----- Message from Peter R Ennever, MD sent at 05/19/2017  2:19 PM EST ----- Call - iron level is ok!!  Pete  

## 2017-06-23 DIAGNOSIS — G43009 Migraine without aura, not intractable, without status migrainosus: Secondary | ICD-10-CM | POA: Diagnosis not present

## 2017-06-23 DIAGNOSIS — E1165 Type 2 diabetes mellitus with hyperglycemia: Secondary | ICD-10-CM | POA: Diagnosis not present

## 2017-06-23 DIAGNOSIS — E119 Type 2 diabetes mellitus without complications: Secondary | ICD-10-CM | POA: Diagnosis not present

## 2017-06-23 DIAGNOSIS — E785 Hyperlipidemia, unspecified: Secondary | ICD-10-CM | POA: Diagnosis not present

## 2017-07-14 DIAGNOSIS — H524 Presbyopia: Secondary | ICD-10-CM | POA: Diagnosis not present

## 2017-07-14 DIAGNOSIS — E119 Type 2 diabetes mellitus without complications: Secondary | ICD-10-CM | POA: Diagnosis not present

## 2017-07-14 DIAGNOSIS — H21231 Degeneration of iris (pigmentary), right eye: Secondary | ICD-10-CM | POA: Diagnosis not present

## 2017-07-14 DIAGNOSIS — H2513 Age-related nuclear cataract, bilateral: Secondary | ICD-10-CM | POA: Diagnosis not present

## 2017-09-12 DIAGNOSIS — R52 Pain, unspecified: Secondary | ICD-10-CM | POA: Diagnosis not present

## 2017-09-12 DIAGNOSIS — J189 Pneumonia, unspecified organism: Secondary | ICD-10-CM | POA: Diagnosis not present

## 2017-09-12 DIAGNOSIS — R0602 Shortness of breath: Secondary | ICD-10-CM | POA: Diagnosis not present

## 2017-09-12 DIAGNOSIS — R509 Fever, unspecified: Secondary | ICD-10-CM | POA: Diagnosis not present

## 2017-09-12 DIAGNOSIS — Z8701 Personal history of pneumonia (recurrent): Secondary | ICD-10-CM | POA: Diagnosis not present

## 2017-09-23 DIAGNOSIS — E119 Type 2 diabetes mellitus without complications: Secondary | ICD-10-CM | POA: Diagnosis not present

## 2017-09-23 DIAGNOSIS — E1165 Type 2 diabetes mellitus with hyperglycemia: Secondary | ICD-10-CM | POA: Diagnosis not present

## 2017-10-01 DIAGNOSIS — E039 Hypothyroidism, unspecified: Secondary | ICD-10-CM | POA: Diagnosis not present

## 2017-11-17 ENCOUNTER — Inpatient Hospital Stay: Payer: BLUE CROSS/BLUE SHIELD | Attending: Hematology & Oncology | Admitting: Hematology & Oncology

## 2017-11-17 ENCOUNTER — Encounter: Payer: Self-pay | Admitting: Hematology & Oncology

## 2017-11-17 ENCOUNTER — Other Ambulatory Visit: Payer: Self-pay

## 2017-11-17 ENCOUNTER — Inpatient Hospital Stay: Payer: BLUE CROSS/BLUE SHIELD

## 2017-11-17 ENCOUNTER — Telehealth: Payer: Self-pay | Admitting: *Deleted

## 2017-11-17 VITALS — BP 137/67 | HR 88 | Temp 99.0°F | Resp 18 | Wt 222.0 lb

## 2017-11-17 DIAGNOSIS — D500 Iron deficiency anemia secondary to blood loss (chronic): Secondary | ICD-10-CM

## 2017-11-17 DIAGNOSIS — D509 Iron deficiency anemia, unspecified: Secondary | ICD-10-CM | POA: Insufficient documentation

## 2017-11-17 DIAGNOSIS — Z862 Personal history of diseases of the blood and blood-forming organs and certain disorders involving the immune mechanism: Secondary | ICD-10-CM | POA: Insufficient documentation

## 2017-11-17 DIAGNOSIS — G4733 Obstructive sleep apnea (adult) (pediatric): Secondary | ICD-10-CM

## 2017-11-17 LAB — SAVE SMEAR

## 2017-11-17 LAB — CBC WITH DIFFERENTIAL (CANCER CENTER ONLY)
Basophils Absolute: 0 10*3/uL (ref 0.0–0.1)
Basophils Relative: 0 %
Eosinophils Absolute: 0.4 10*3/uL (ref 0.0–0.5)
Eosinophils Relative: 4 %
HCT: 44.1 % (ref 34.8–46.6)
Hemoglobin: 14.5 g/dL (ref 11.6–15.9)
Lymphocytes Relative: 33 %
Lymphs Abs: 3.1 10*3/uL (ref 0.9–3.3)
MCH: 30.2 pg (ref 26.0–34.0)
MCHC: 32.9 g/dL (ref 32.0–36.0)
MCV: 91.9 fL (ref 81.0–101.0)
Monocytes Absolute: 0.6 10*3/uL (ref 0.1–0.9)
Monocytes Relative: 7 %
Neutro Abs: 5.2 10*3/uL (ref 1.5–6.5)
Neutrophils Relative %: 56 %
Platelet Count: 299 10*3/uL (ref 145–400)
RBC: 4.8 MIL/uL (ref 3.70–5.32)
RDW: 13 % (ref 11.1–15.7)
WBC Count: 9.3 10*3/uL (ref 3.9–10.0)

## 2017-11-17 LAB — IRON AND TIBC
Iron: 84 ug/dL (ref 41–142)
Saturation Ratios: 29 % (ref 21–57)
TIBC: 291 ug/dL (ref 236–444)
UIBC: 208 ug/dL

## 2017-11-17 LAB — LACTATE DEHYDROGENASE: LDH: 169 U/L (ref 125–245)

## 2017-11-17 LAB — FERRITIN: Ferritin: 305 ng/mL — ABNORMAL HIGH (ref 9–269)

## 2017-11-17 NOTE — Telephone Encounter (Addendum)
Generic message left on personal voice mail  ----- Message from Josph MachoPeter R Ennever, MD sent at 11/17/2017  1:25 PM EDT ----- Call - the iron level is ok!!  Cindee LamePete

## 2017-11-17 NOTE — Progress Notes (Signed)
Hematology and Oncology Follow Up Visit  TKAI LARGE [id number] [birth year] 57 y.o. 11/17/2017   Principle Diagnosis:  Leukocytosis Iron deficiency anemia   Current Therapy:   IV iron as indicated - last received in January 2018    Interim History:  Ms. Briana Kelley is here today for follow-up.  She is doing pretty well.  We last saw her back in December.  This was after the big snowstorm.  She got through all of that.  She got through the winter without any difficulties.  She has had no problems with work.  Her last iron studies showed a ferritin of 523.  Iron saturation was 24%.  She is had no mouth sores.  She had no fever.  She had no rashes.  She has had no change in bowel or bladder habits.   Is overall, her performance status is ECOG 0.    Medications:  Allergies as of 11/17/2017      Reactions   Aspirin Shortness Of Breath, Other (See Comments)   "cold sweats and migraines" (05/13/2012) Cold sweats and shakes. Also gets migraine   Celecoxib Shortness Of Breath   Cold sweats and shakes. Also get migraine   Cinnamon Shortness Of Breath   Cinnamon Flavor Anaphylaxis   sneezing   Codeine Nausea Only, Nausea And Vomiting, Shortness Of Breath   Gives her migraines, makes sick   Nitroglycerin Nausea And Vomiting   Chills and migraine; patient stayed in hospital one week after given Nitroglycerin   Nortriptyline Shortness Of Breath   Jaw pain   Oxycodone-acetaminophen Shortness Of Breath   Cold sweats and shakes. Hallucinations and bleeding   Percocet [oxycodone-acetaminophen] Other (See Comments)   Halucinations; "and blood in my urine" (05/13/2012)   Sumatriptan Shortness Of Breath   Ultram [tramadol] Shortness Of Breath   Pamelor [nortriptyline Hcl] Other (See Comments)   Jaw pain   2,4-d Dimethylamine (amisol)    Cold sweats and shakes.    Tramadol Hcl    Cold sweats and shakes. Also gets migraine      Medication List        Accurate as of 11/17/17 10:41 AM.  Always use your most recent med list.          amitriptyline 25 MG tablet Commonly known as:  ELAVIL Take 75 mg by mouth at bedtime.   atorvastatin 20 MG tablet Commonly known as:  LIPITOR Take 20 mg by mouth daily.   baclofen 10 MG tablet Commonly known as:  LIORESAL TAKE 1 TABLET THREE TIMES A DAY AS NEEDED MUSCLE TIGHTNESS   butalbital-acetaminophen-caffeine 50-325-40-30 MG capsule Commonly known as:  FIORICET WITH CODEINE Take 1 capsule by mouth every 4 (four) hours as needed. For headache   cholecalciferol 1000 units tablet Commonly known as:  VITAMIN D Take 5,000 Units by mouth 2 (two) times daily.   FLAX SEED OIL PO Take 2 tablets by mouth daily.   glimepiride 4 MG tablet Commonly known as:  AMARYL Take 4 mg by mouth 2 (two) times daily.   ibuprofen 600 MG tablet Commonly known as:  ADVIL,MOTRIN Take 1 tablet (600 mg total) by mouth 3 (three) times daily as needed for pain.   JENTADUETO 2.10-998 MG Tabs Generic drug:  linaGLIPtin-metFORMIN HCl TK 1 T PO BID WC   Levothyroxine-Liothyronine 90 MG Tabs 1 tablet by mouth in addition to 1 tablet of [employer] Thyroid 15 mg daily.  Total daily dose = 105 mg   Levothyroxine-Liothyronine 15 MG Tabs 1 tablet  by mouth in addition to 1 tablet of [employer] Thyroid 90mg  daily.  Total daily dose = 105 mg   lisinopril 5 MG tablet Commonly known as:  PRINIVIL,ZESTRIL Take 5 mg by mouth daily.   naratriptan 2.5 MG tablet Commonly known as:  AMERGE Take 2.5 mg by mouth daily as needed. Take one (1) tablet at onset of headache; if returns or does not resolve, may repeat after 4 hours; do not exceed five (5) mg in 24 hours.   ondansetron 8 MG tablet Commonly known as:  ZOFRAN   promethazine 12.5 MG tablet Commonly known as:  PHENERGAN Take 1 tablet (12.5 mg total) by mouth every 6 (six) hours as needed for nausea.   sitaGLIPtin-metformin 50-1000 MG tablet Commonly known as:  JANUMET Take by mouth.   verapamil 240 MG CR  tablet Commonly known as:  CALAN-SR Take 240 mg by mouth 2 (two) times daily.       Allergies:  Allergies  Allergen Reactions  . Aspirin Shortness Of Breath and Other (See Comments)    "cold sweats and migraines" (05/13/2012) Cold sweats and shakes. Also gets migraine   . Celecoxib Shortness Of Breath    Cold sweats and shakes. Also get migraine   . Cinnamon Shortness Of Breath  . Cinnamon Flavor Anaphylaxis    sneezing  . Codeine Nausea Only, Nausea And Vomiting and Shortness Of Breath    Gives her migraines, makes sick  . Nitroglycerin Nausea And Vomiting    Chills and migraine; patient stayed in hospital one week after given Nitroglycerin  . Nortriptyline Shortness Of Breath    Jaw pain  . Oxycodone-Acetaminophen Shortness Of Breath    Cold sweats and shakes. Hallucinations and bleeding   . Percocet [Oxycodone-Acetaminophen] Other (See Comments)    Halucinations; "and blood in my urine" (05/13/2012)  . Sumatriptan Shortness Of Breath  . Ultram [Tramadol] Shortness Of Breath  . Pamelor [Nortriptyline Hcl] Other (See Comments)    Jaw pain   . 2,4-D Dimethylamine (Amisol)     Cold sweats and shakes.   . Tramadol Hcl     Cold sweats and shakes. Also gets migraine    Past Medical History, Surgical history, Social history, and Family History were reviewed and updated.  Review of Systems: Review of Systems  All other systems reviewed and are negative.    Physical Exam:  weight is 222 lb (100.7 kg). Her oral temperature is 99 F (37.2 C). Her blood pressure is 137/67 and her pulse is 88. Her respiration is 18 and oxygen saturation is 94%.   Wt Readings from Last 3 Encounters:  11/17/17 222 lb (100.7 kg)  05/19/17 214 lb (97.1 kg)  11/18/16 214 lb (97.1 kg)    Physical Exam  Constitutional: She is oriented to person, place, and time.  HENT:  Head: Normocephalic and atraumatic.  Mouth/Throat: Oropharynx is clear and moist.  Eyes: Pupils are equal, round, and  reactive to light. EOM are normal.  Neck: Normal range of motion.  Cardiovascular: Normal rate, regular rhythm and normal heart sounds.  Pulmonary/Chest: Effort normal and breath sounds normal.  Abdominal: Soft. Bowel sounds are normal.  Musculoskeletal: Normal range of motion. She exhibits no edema, tenderness or deformity.  Lymphadenopathy:    She has no cervical adenopathy.  Neurological: She is alert and oriented to person, place, and time.  Skin: Skin is warm and dry. No rash noted. No erythema.  Psychiatric: She has a normal mood and affect. Her behavior is normal.  Judgment and thought content normal.  Vitals reviewed.    Lab Results  Component Value Date   WBC 9.3 11/17/2017   HGB 14.5 11/17/2017   HCT 44.1 11/17/2017   MCV 91.9 11/17/2017   PLT 299 11/17/2017   Lab Results  Component Value Date   FERRITIN 523 (H) 05/19/2017   IRON 70 05/19/2017   TIBC 295 05/19/2017   UIBC 225 05/19/2017   IRONPCTSAT 24 05/19/2017   Lab Results  Component Value Date   RETICCTPCT 1.2 05/31/2015   RBC 4.80 11/17/2017   RETICCTABS 61.8 05/31/2015   No results found for: KPAFRELGTCHN, LAMBDASER, KAPLAMBRATIO No results found for: IGGSERUM, IGA, IGMSERUM No results found for: TOTALPROTELP, ALBUMINELP, A1GS, A2GS, BETS, BETA2SER, GAMS, MSPIKE, SPEI   Chemistry      Component Value Date/Time   NA 135 (L) 11/18/2016 1029   K 4.8 11/18/2016 1029   CL 105 05/20/2012 0500   CO2 25 11/18/2016 1029   BUN 18.1 11/18/2016 1029   CREATININE 0.8 11/18/2016 1029      Component Value Date/Time   CALCIUM 10.6 (H) 11/18/2016 1029   ALKPHOS 88 11/18/2016 1029   AST 20 11/18/2016 1029   ALT 26 11/18/2016 1029   BILITOT 0.33 11/18/2016 1029     Impression and Plan: Ms. Briana Kelley is a 57 yo white female with a history of iron deficiency anemia and leukocytosis. She received IV iron in January 2017.  Everything looks quite good.  She has no leukocytosis.  I suspect the leukocytosis is probably  reactive.  At this point, I think we can see her back as needed.  I still think that we are adding much to her medical care right now.  She will let us know if she is having any issues.  I know she sees Dr. Cliffton AstersWhite and she will also let us know if there is any drop in her hemoglobin.    Josph MachoPeter R Namine Beahm, MD 6/17/201910:41 AM

## 2017-12-16 DIAGNOSIS — E1165 Type 2 diabetes mellitus with hyperglycemia: Secondary | ICD-10-CM | POA: Diagnosis not present

## 2017-12-16 DIAGNOSIS — E119 Type 2 diabetes mellitus without complications: Secondary | ICD-10-CM | POA: Diagnosis not present

## 2018-03-18 DIAGNOSIS — Z23 Encounter for immunization: Secondary | ICD-10-CM | POA: Diagnosis not present

## 2018-03-18 DIAGNOSIS — E1165 Type 2 diabetes mellitus with hyperglycemia: Secondary | ICD-10-CM | POA: Diagnosis not present

## 2018-03-18 DIAGNOSIS — E785 Hyperlipidemia, unspecified: Secondary | ICD-10-CM | POA: Diagnosis not present

## 2018-03-18 DIAGNOSIS — E1122 Type 2 diabetes mellitus with diabetic chronic kidney disease: Secondary | ICD-10-CM | POA: Diagnosis not present

## 2018-04-03 DIAGNOSIS — Z79899 Other long term (current) drug therapy: Secondary | ICD-10-CM | POA: Diagnosis not present

## 2018-04-03 DIAGNOSIS — E039 Hypothyroidism, unspecified: Secondary | ICD-10-CM | POA: Diagnosis not present

## 2018-06-05 DIAGNOSIS — J069 Acute upper respiratory infection, unspecified: Secondary | ICD-10-CM | POA: Diagnosis not present

## 2018-06-18 DIAGNOSIS — Z79899 Other long term (current) drug therapy: Secondary | ICD-10-CM | POA: Diagnosis not present

## 2018-06-18 DIAGNOSIS — E1165 Type 2 diabetes mellitus with hyperglycemia: Secondary | ICD-10-CM | POA: Diagnosis not present

## 2018-06-18 DIAGNOSIS — E785 Hyperlipidemia, unspecified: Secondary | ICD-10-CM | POA: Diagnosis not present

## 2018-06-18 DIAGNOSIS — E039 Hypothyroidism, unspecified: Secondary | ICD-10-CM | POA: Diagnosis not present

## 2018-08-07 DIAGNOSIS — H04123 Dry eye syndrome of bilateral lacrimal glands: Secondary | ICD-10-CM | POA: Diagnosis not present

## 2018-09-18 DIAGNOSIS — G43009 Migraine without aura, not intractable, without status migrainosus: Secondary | ICD-10-CM | POA: Diagnosis not present

## 2018-09-18 DIAGNOSIS — E1165 Type 2 diabetes mellitus with hyperglycemia: Secondary | ICD-10-CM | POA: Diagnosis not present

## 2018-09-21 DIAGNOSIS — E1165 Type 2 diabetes mellitus with hyperglycemia: Secondary | ICD-10-CM | POA: Diagnosis not present

## 2018-10-06 DIAGNOSIS — E039 Hypothyroidism, unspecified: Secondary | ICD-10-CM | POA: Diagnosis not present

## 2018-10-06 DIAGNOSIS — H16223 Keratoconjunctivitis sicca, not specified as Sjogren's, bilateral: Secondary | ICD-10-CM | POA: Diagnosis not present

## 2019-01-12 DIAGNOSIS — H16223 Keratoconjunctivitis sicca, not specified as Sjogren's, bilateral: Secondary | ICD-10-CM | POA: Diagnosis not present

## 2019-01-18 DIAGNOSIS — E1165 Type 2 diabetes mellitus with hyperglycemia: Secondary | ICD-10-CM | POA: Diagnosis not present

## 2019-01-18 DIAGNOSIS — G43009 Migraine without aura, not intractable, without status migrainosus: Secondary | ICD-10-CM | POA: Diagnosis not present

## 2019-02-03 DIAGNOSIS — E1165 Type 2 diabetes mellitus with hyperglycemia: Secondary | ICD-10-CM | POA: Diagnosis not present

## 2019-04-09 DIAGNOSIS — E039 Hypothyroidism, unspecified: Secondary | ICD-10-CM | POA: Diagnosis not present

## 2019-05-05 DIAGNOSIS — G43009 Migraine without aura, not intractable, without status migrainosus: Secondary | ICD-10-CM | POA: Diagnosis not present

## 2019-05-05 DIAGNOSIS — E785 Hyperlipidemia, unspecified: Secondary | ICD-10-CM | POA: Diagnosis not present

## 2019-05-05 DIAGNOSIS — E1165 Type 2 diabetes mellitus with hyperglycemia: Secondary | ICD-10-CM | POA: Diagnosis not present

## 2019-05-13 DIAGNOSIS — H16223 Keratoconjunctivitis sicca, not specified as Sjogren's, bilateral: Secondary | ICD-10-CM | POA: Diagnosis not present

## 2019-06-24 DIAGNOSIS — E1165 Type 2 diabetes mellitus with hyperglycemia: Secondary | ICD-10-CM | POA: Diagnosis not present

## 2019-06-24 DIAGNOSIS — Z23 Encounter for immunization: Secondary | ICD-10-CM | POA: Diagnosis not present

## 2019-06-24 DIAGNOSIS — E785 Hyperlipidemia, unspecified: Secondary | ICD-10-CM | POA: Diagnosis not present

## 2019-07-09 DIAGNOSIS — H5201 Hypermetropia, right eye: Secondary | ICD-10-CM | POA: Diagnosis not present

## 2019-07-09 DIAGNOSIS — H52201 Unspecified astigmatism, right eye: Secondary | ICD-10-CM | POA: Diagnosis not present

## 2019-07-09 DIAGNOSIS — E119 Type 2 diabetes mellitus without complications: Secondary | ICD-10-CM | POA: Diagnosis not present

## 2019-08-28 ENCOUNTER — Ambulatory Visit: Payer: BC Managed Care – PPO | Attending: Internal Medicine

## 2019-08-28 DIAGNOSIS — Z23 Encounter for immunization: Secondary | ICD-10-CM

## 2019-08-28 NOTE — Progress Notes (Signed)
   Covid-19 Vaccination Clinic  Name:  Briana Kelley    MRN: [id number] DOB: 01-31-1961  08/28/2019  [name] was observed post Covid-19 immunization for 15 minutes without incident. She was provided with Vaccine Information Sheet and instruction to access the V-Safe system.   [name] was instructed to call 911 with any severe reactions post vaccine: Marland Kitchen Difficulty breathing  . Swelling of face and throat  . A fast heartbeat  . A bad rash all over body  . Dizziness and weakness   Immunizations Administered    Name Date Dose VIS Date Route   Pfizer COVID-19 Vaccine 08/28/2019 10:16 AM 0.3 mL 05/14/2019 Intramuscular   Manufacturer: [employer], [employer]   Lot: [id number]   NDC: [address]-0

## 2019-09-21 ENCOUNTER — Ambulatory Visit: Payer: BC Managed Care – PPO | Attending: Internal Medicine

## 2019-09-21 DIAGNOSIS — Z23 Encounter for immunization: Secondary | ICD-10-CM

## 2019-09-21 NOTE — Progress Notes (Signed)
   Covid-19 Vaccination Clinic  Name:  Briana Kelley    MRN: [id number] DOB: 10-06-1960  09/21/2019  [name] was observed post Covid-19 immunization for 15 minutes without incident. She was provided with Vaccine Information Sheet and instruction to access the V-Safe system.   [name] was instructed to call 911 with any severe reactions post vaccine: Marland Kitchen Difficulty breathing  . Swelling of face and throat  . A fast heartbeat  . A bad rash all over body  . Dizziness and weakness   Immunizations Administered    Name Date Dose VIS Date Route   Pfizer COVID-19 Vaccine 09/21/2019 10:28 AM 0.3 mL 07/28/2018 Intramuscular   Manufacturer: [employer], [employer]   Lot: [id number]   NDC: [address]-4

## 2019-11-02 DIAGNOSIS — Z79899 Other long term (current) drug therapy: Secondary | ICD-10-CM | POA: Diagnosis not present

## 2019-11-02 DIAGNOSIS — G43009 Migraine without aura, not intractable, without status migrainosus: Secondary | ICD-10-CM | POA: Diagnosis not present

## 2019-11-02 DIAGNOSIS — E1165 Type 2 diabetes mellitus with hyperglycemia: Secondary | ICD-10-CM | POA: Diagnosis not present

## 2019-11-02 DIAGNOSIS — R519 Headache, unspecified: Secondary | ICD-10-CM | POA: Diagnosis not present

## 2019-11-02 DIAGNOSIS — E785 Hyperlipidemia, unspecified: Secondary | ICD-10-CM | POA: Diagnosis not present

## 2019-12-27 ENCOUNTER — Encounter: Payer: Self-pay | Admitting: Neurology

## 2019-12-27 ENCOUNTER — Ambulatory Visit: Payer: BC Managed Care – PPO | Admitting: Neurology

## 2019-12-27 ENCOUNTER — Encounter: Payer: Self-pay | Admitting: *Deleted

## 2019-12-27 ENCOUNTER — Other Ambulatory Visit: Payer: Self-pay

## 2019-12-27 VITALS — BP 138/72 | HR 95 | Ht 62.0 in | Wt 218.0 lb

## 2019-12-27 DIAGNOSIS — G43711 Chronic migraine without aura, intractable, with status migrainosus: Secondary | ICD-10-CM | POA: Diagnosis not present

## 2019-12-27 MED ORDER — AJOVY 225 MG/1.5ML ~~LOC~~ SOAJ: 675.0000 mg | SUBCUTANEOUS | 3 refills | Status: DC

## 2019-12-27 MED ORDER — UBRELVY 50 MG PO TABS: 50.0000 mg | ORAL_TABLET | Freq: Every day | ORAL | 0 refills | Status: DC | PRN

## 2019-12-27 MED ORDER — UBRELVY 50 MG PO TABS: 50.0000 mg | ORAL_TABLET | ORAL | 0 refills | Status: DC | PRN

## 2019-12-27 NOTE — Progress Notes (Signed)
[facility]    Provider:  Dr Lucia Gaskins Requesting Provider: Laurann Montana, MD Primary Care Provider:  Laurann Montana, MD  CC:  migraines  HPI:  Briana Kelley is a 59 y.o. female here as requested by Laurann Montana, MD for headaches.  She has a PMHx of: Diabetes, hypothyroidism, dry eye,  migraines, hypercholesterolemia, glaucoma, cataracts, depression, sleep apnea status post surgery, iron deficiency anemia, fatty liver.  I reviewed Dr. Lucilla Lame notes: She has had migraines all her life, she is seen multiple neurologists including a neurologist at [facility] Dr. Henry Russel and Dr. Vela Prose in the past.  She is on verapamil and amitriptyline for her migraines and I was able to review prior neurology notes, patient revealed that headaches were getting worse, over 30 days she had 3 severe headaches, 8-12 moderate headaches and at least daily mild headaches, they tend to start moderately and then will build up to a more severe state at times, she is used Migranal nasal spray found it with some benefit, she has used Fioricet sparingly in the past, headaches are often located diffusely, sharp pain with nausea, light and sound sensitivity worsening of the head with movement, pulsating pounding, she has been on triptan's including Relpax, Maxalt, Frova, Zomig and Imitrex and she had difficulty tolerating Imitrex, she is also been tried on Zomig, verapamil.  I also reviewed neurology examination which was normal including ophthalmologic, cranial nerve, sensory, muscle, cerebellum, gait.  Also physical exam was normal including cardiovascular, respiratory, head, throat, neck, lungs, abdomen.  She did not wish to proceed with Botox.  Last appointment her Zonegran was increased, she was continued on her current dose of verapamil, zonisamide was increased to 400 mg at bedtime, Migranal nasal spray and Fioricet as acute management, also Skelaxin was given for neck muscle tightness.  Patient is here and  reports she has had migraines in tension headaches "all my life", she reports she always has a headache, this has been ongoing since her youngest was born in 1999, she is tried everything verapamil, amitriptyline, ibuprofen, Aleve, Fioricet, Amerge, baclofen, Zofran and nothing is helping. Daily headaches. Migraines start in the temples, can be unilateral or start on both sides, pressure and throbbing, light and sound sensitivity, nausea, hurts to move, at least 10 moderate or severe migraine days a month, migraines can last 24-72 hours, going to bed helps, certain foods such as lots of garlic, processed meats, she has a lot of nausea with the migraines and sharp pains may happen, they can start slowly or some can be acute and severe. She has had multiple brain imaging studies throughout the years which were unremarkable, worse after the meningitis/encephalitis in 1999, over the last several years she has noticed worsening of her symptoms. Very sensitive scalp can;t even touch it it is so sensitive. She had surgery bc of sleep apnea which helped. She couldn't wear the cpap and declines. She gets swelling around the head with severe headaches and lots sensitivity of the scalp. Verapamil helps. No other focal neurologic deficits, associated symptoms, inciting events or modifiable factors. She has no aura, we discussed what an aura is and she does not have them.   From a review of records patient has tried the following medications that can be used in migraine management: Verapamil, amitriptyline, Migranal, ibuprofen, Aleve, Fioricet, Amerge, baclofen, Zofran, Relpax, Maxalt, Frova, Zomig, Imitrex, Zonegran, Skelaxin, Tylenol, promethazine, prozac, Topamax(side effects)and many more things over the years  Reviewed notes, labs and imaging from outside physicians, which  showed: tsh 04/2019 normal,   Review of Systems: Patient complains of symptoms per HPI as well as the following symptoms: intractable headaches.  Pertinent negatives and positives per HPI. All others negative.   Social History   Socioeconomic History  . Marital status: Divorced    Spouse name: Not on file  . Number of children: 2  . Years of education: Not on file  . Highest education level: Not on file  Occupational History  . Not on file  Tobacco Use  . Smoking status: Never Smoker  . Smokeless tobacco: Never Used  Vaping Use  . Vaping Use: Never used  Substance and Sexual Activity  . Alcohol use: No    Alcohol/week: 0.0 standard drinks  . Drug use: No  . Sexual activity: Never    Birth control/protection: Surgical  Other Topics Concern  . Not on file  Social History Narrative   Her children live with her    Right handed   Caffeine: 4 cups/day   Social Determinants of Health   Financial Resource Strain:   . Difficulty of Paying Living Expenses:   Food Insecurity:   . Worried About Programme researcher, broadcasting/film/video in the Last Year:   . Barista in the Last Year:   Transportation Needs:   . Freight forwarder (Medical):   Marland Kitchen Lack of Transportation (Non-Medical):   Physical Activity:   . Days of Exercise per Week:   . Minutes of Exercise per Session:   Stress:   . Feeling of Stress :   Social Connections:   . Frequency of Communication with Friends and Family:   . Frequency of Social Gatherings with Friends and Family:   . Attends Religious Services:   . Active Member of Clubs or Organizations:   . Attends Banker Meetings:   Marland Kitchen Marital Status:   Intimate Partner Violence:   . Fear of Current or Ex-Partner:   . Emotionally Abused:   Marland Kitchen Physically Abused:   . Sexually Abused:     Family History  Problem Relation Age of Onset  . Breast cancer Mother   . Diabetes Mother   . Breast cancer Maternal Aunt   . CAD Maternal Grandmother   . Diabetes Father   . Diabetes Paternal Aunt   . Lung cancer Maternal Uncle   . Migraines Daughter   . Migraines Daughter     Past Medical History:    Diagnosis Date  . Asymptomatic gallstones   . Bleeding 05/31/2015  . Bruises easily   . Cataract   . Chronic bronchitis (HCC)    "most likely q yr" (05/13/2012)  . Encephalitis 1999  . Fatty liver    on CT   . GERD (gastroesophageal reflux disease)    pt unaware of this   . Gestational hypertension 1996  . Glaucoma   . Grand mal seizure [facility]) 1999   "related to encephalitis and meningitis" (05/13/2012)  . Heart murmur   . History of blood transfusion 2008   "related to low iron" (05/13/2012)  . Hypercholesteremia   . Hyperlipidemia   . Hypothyroidism   . Iron deficiency anemia 2008  . Iron deficiency anemia 05/31/2015  . Iron malabsorption 05/31/2015  . Meningitis   . Migraine    "chronic" (05/13/2012)  . Multinodular goiter    Dr. Lisabeth Devoid  . Pneumonia    "several times" (05/13/2012)  . Situational depression 2010   after major surgery, daughters were suicidal  . Sleep apnea  does not wear mask  . Type II diabetes mellitus (HCC)   . Umbilical hernia    small  . Vertigo     Patient Active Problem List   Diagnosis Date Noted  . Chronic migraine without aura, with intractable migraine, so stated, with status migrainosus 12/27/2019  . Iron deficiency anemia 05/31/2015  . Iron malabsorption 05/31/2015  . Bleeding 05/31/2015  . HCAP (healthcare-associated pneumonia) 05/17/2012  . Fever 05/17/2012  . Abdominal pain, other specified site 05/17/2012  . Hypokalemia 05/17/2012  . Leukocytosis 05/17/2012  . SOB (shortness of breath) 05/17/2012  . Diabetes mellitus (HCC) 05/17/2012  . Hyperlipidemia 05/17/2012  . Hypothyroid 05/17/2012  . Obesity 05/17/2012  . Migraines 05/17/2012  . Obstructive sleep apnea (adult) (pediatric) 05/17/2012  . UTI (lower urinary tract infection) 05/17/2012    Past Surgical History:  Procedure Laterality Date  . ABDOMINAL HYSTERECTOMY  2008   partial  . BILATERAL OOPHERECTOMY     for cysts, puctured cyst, were unable to remove  the fibrous tissue  . CARPAL TUNNEL RELEASE  1989   left wrist  . CESAREAN SECTION  [id number]  . CHOLECYSTECTOMY  05/13/2012   laparoscopic  . CHOLECYSTECTOMY  05/13/2012   Procedure: LAPAROSCOPIC CHOLECYSTECTOMY WITH INTRAOPERATIVE CHOLANGIOGRAM;  Surgeon: Romie Levee, MD;  Location: [facility] OR;  Service: General;  Laterality: N/A;  . COLONOSCOPY  09/21/1959, 03/03/2013  . DILATION AND CURETTAGE OF UTERUS  1997; 1998  . ESOPHAGOGASTRODUODENOSCOPY  10/2013  . HERNIA REPAIR  1997   umbilical  . KNEE ARTHROSCOPY W/ MENISCAL REPAIR  1999   left knee  . OVARIAN CYST REMOVAL  2009  . REDUCTION MAMMAPLASTY  2001   breast reduction  . TONSILLECTOMY AND ADENOIDECTOMY  ~ 2000  . TUBAL LIGATION  1999  . WISDOM TOOTH EXTRACTION  1980's?  . WRIST SURGERY Left    s/p dislocation, initially closed then open    Current Outpatient Medications  Medication Sig Dispense Refill  . amitriptyline (ELAVIL) 25 MG tablet Take 75 mg by mouth at bedtime.     Marland Kitchen atorvastatin (LIPITOR) 20 MG tablet Take 20 mg by mouth daily.  3  . baclofen (LIORESAL) 10 MG tablet TAKE 1 TABLET THREE TIMES A DAY AS NEEDED MUSCLE TIGHTNESS  2  . butalbital-acetaminophen-caffeine (FIORICET WITH CODEINE) 50-325-40-30 MG per capsule Take 1 capsule by mouth every 4 (four) hours as needed. For headache    . cholecalciferol (VITAMIN D) 1000 UNITS tablet Take 5,000 Units by mouth 2 (two) times daily.     . cycloSPORINE (RESTASIS OP) Place 1 drop into both eyes in the morning and at bedtime.    . Flaxseed, Linseed, (FLAX SEED OIL PO) Take 2 tablets by mouth daily.     Marland Kitchen glimepiride (AMARYL) 4 MG tablet Take 4 mg by mouth 2 (two) times daily.     Marland Kitchen ibuprofen (ADVIL,MOTRIN) 600 MG tablet Take 1 tablet (600 mg total) by mouth 3 (three) times daily as needed for pain. 30 tablet 0  . lisinopril (PRINIVIL,ZESTRIL) 5 MG tablet Take 5 mg by mouth daily.     . Lutein-Zeaxanthin 25-5 MG CAPS Take 1 capsule by mouth daily.    . metFORMIN (GLUCOPHAGE)  500 MG tablet Take 2,000 mg by mouth daily.    . naratriptan (AMERGE) 2.5 MG tablet Take 2.5 mg by mouth daily as needed. Take one (1) tablet at onset of headache; if returns or does not resolve, may repeat after 4 hours; do not exceed five (5) mg in  24 hours.    . ondansetron (ZOFRAN) 8 MG tablet     . pioglitazone (ACTOS) 30 MG tablet Take 30 mg by mouth daily.    . promethazine (PHENERGAN) 12.5 MG tablet Take 1 tablet (12.5 mg total) by mouth every 6 (six) hours as needed for nausea. 30 tablet 0  . Semaglutide (RYBELSUS) 14 MG TABS Take 1 tablet by mouth daily.    . Thyroid (LEVOTHYROXINE-LIOTHYRONINE) 15 MG TABS 1 tablet by mouth in addition to 1 tablet of Armour Thyroid 90mg  daily.  Total daily dose = 105 mg    . Thyroid (LEVOTHYROXINE-LIOTHYRONINE) 90 MG TABS 1 tablet by mouth in addition to 1 tablet of Armour Thyroid 15 mg daily.  Total daily dose = 105 mg    . verapamil (CALAN-SR) 240 MG CR tablet Take 240 mg by mouth 2 (two) times daily.     . Fremanezumab-vfrm (AJOVY) 225 MG/1.5ML SOAJ Inject 675 mg into the skin every 3 (three) months. 3 pen 3  . Fremanezumab-vfrm (AJOVY) 225 MG/1.5ML SOAJ Inject 675 mg into the skin every 3 (three) months. 3 pen 3  . Ubrogepant (UBRELVY) 50 MG TABS Take 50 mg by mouth daily as needed. Max 50mg  a day 4 tablet 0   No current facility-administered medications for this visit.    Allergies as of 12/27/2019 - Review Complete 12/27/2019  Allergen Reaction Noted  . Aspirin Shortness Of Breath, Nausea Only, and Other (See Comments) 04/17/2011  . Celecoxib Shortness Of Breath and Nausea Only 04/17/2011  . Cinnamon Shortness Of Breath 12/15/2015  . Cinnamon flavor Anaphylaxis 05/16/2012  . Codeine Nausea Only, Nausea And Vomiting, and Shortness Of Breath 05/16/2012  . Nitroglycerin Nausea And Vomiting 05/04/2012  . Nortriptyline Shortness Of Breath 04/17/2011  . Oxycodone-acetaminophen Shortness Of Breath 04/17/2011  . Percocet [oxycodone-acetaminophen]  Nausea Only and Other (See Comments) 04/02/2012  . Sumatriptan Shortness Of Breath 12/15/2015  . Ultram [tramadol] Shortness Of Breath 04/02/2012  . Pamelor [nortriptyline hcl] Other (See Comments) 04/02/2012  . 2,4-d dimethylamine (amisol)  04/17/2011  . Jardiance [empagliflozin]  12/27/2019  . Tramadol hcl Nausea Only 04/17/2011  . Wellbutrin xl [bupropion]  12/27/2019    Vitals: BP (!) 138/72 (BP Location: Left Arm, Patient Position: Sitting)   Pulse 95   Ht 5\' 2"  (1.575 m)   Wt (!) 218 lb (98.9 kg)   SpO2 94%   BMI 39.87 kg/m  Last Weight:  Wt Readings from Last 1 Encounters:  12/27/19 (!) 218 lb (98.9 kg)   Last Height:   Ht Readings from Last 1 Encounters:  12/27/19 5\' 2"  (1.575 m)     Physical exam: Exam: Gen: NAD, conversant, well nourised, obese, well groomed                     CV: RRR, no MRG. No Carotid Bruits. No peripheral edema, warm, nontender Eyes: Conjunctivae clear without exudates or hemorrhage  Neuro: Detailed Neurologic Exam  Speech:    Speech is normal; fluent and spontaneous with normal comprehension.  Cognition:    The patient is oriented to person, place, and time;     recent and remote memory intact;     language fluent;     normal attention, concentration,     fund of knowledge Cranial Nerves:    The pupils are equal, round, and reactive to light. Could not visualize . Visual fields are full to finger confrontation. Extraocular movements are intact. Trigeminal sensation is intact and the muscles of  mastication are normal. The face is symmetric. The palate elevates in the midline. Hearing intact. Voice is normal. Shoulder shrug is normal. The tongue has normal motion without fasciculations.   Coordination:    No ataxia or dysmetria  Gait:    Normal native gait  Motor Observation:    No asymmetry, no atrophy, and no involuntary movements noted. Tone:    Normal muscle tone.    Posture:    Posture is normal. normal erect     Strength:    Strength is V/V in the upper and lower limbs.      Sensation: intact to LT     Reflex Exam:  DTR's:    Trace lowers, 2+ biceps   Toes:    The toes are equiv bilaterally.   Clonus:    Clonus is absent.    Assessment/Plan:  59 y.o. female here as requested by Laurann MontanaWhite, Sarann, MD for headaches.  She has a PMHx of: Diabetes, hypothyroidism, dry eye,  migraines, hypercholesterolemia, glaucoma, cataracts, depression, sleep apnea status post surgery, iron deficiency anemia, fatty liver.  I reviewed Dr. Lucilla LameWhite's notes: She has had migraines all her life, she is seen multiple neurologists [facility] Henry Russelaine and Dr. Vela ProseLewitt in the past. Migraines all her life but worsened after meningitis in 1999. She doesn't have auras, we reviewed what an aura it in detail. She has tried so many medications over the years I recommend we try the newer CGRP medications.   From a review of records patient has tried the following medications that can be used in migraine management: Verapamil, amitriptyline, Migranal, ibuprofen, Aleve, Fioricet, Amerge, baclofen, Zofran, Relpax, Maxalt, Frova, Zomig, Imitrex, Zonegran, Skelaxin, Tylenol, promethazine, prozac, Topamax(side effects)and many more things over the years  Start Ajovy, she is afraid of needles so we will inject her with 3 Ajovy every 3 months here in the office. Will try ubrelvy samples.   Discussed MRi brain, at this time she declines but if migraines/headaches continue to be refractory then I hoghly recommend imaging and repeat sleep study.   Meds ordered this encounter  Medications  . DISCONTD: Ubrogepant (UBRELVY) 50 MG TABS    Sig: Take 50-100 mg by mouth every 2 (two) hours as needed. Max 200mg  a day    Dispense:  10 tablet    Refill:  0  . Fremanezumab-vfrm (AJOVY) 225 MG/1.5ML SOAJ    Sig: Inject 675 mg into the skin every 3 (three) months.    Dispense:  3 pen    Refill:  3  . Fremanezumab-vfrm (AJOVY)  225 MG/1.5ML SOAJ    Sig: Inject 675 mg into the skin every 3 (three) months.    Dispense:  3 pen    Refill:  3    Patient has a copay card: Patient has copay card; she can have medication for $5 regardless of insurance approval or copay amount.  Marland Kitchen. Ubrogepant (UBRELVY) 50 MG TABS    Sig: Take 50 mg by mouth daily as needed. Max 50mg  a day    Dispense:  4 tablet    Refill:  0    Cc: Laurann MontanaWhite, [place], MD,    Naomie DeanAntonia Arvis Zwahlen, MD  [facility] [address] [place], [place] [id number]  Phone [phone] Fax [phone]  I spent 60 minutes of face-to-face and non-face-to-face time with patient on the  1. Chronic migraine without aura, with intractable migraine, so stated, with status migrainosus    diagnosis.  This included previsit  chart review, lab review, study review, order entry, electronic health record documentation, patient education on the different diagnostic and therapeutic options, counseling and coordination of care, risks and benefits of management, compliance, or risk factor reduction

## 2019-12-27 NOTE — Patient Instructions (Signed)
[name] as neededArnetha Massy preventative  Fremanezumab injection What is this medicine? FREMANEZUMAB (fre ma NEZ ue mab) is used to prevent migraine headaches. This medicine may be used for other purposes; ask your health care provider or pharmacist if you have questions. COMMON BRAND NAME(S): AJOVY What should I tell my health care provider before I take this medicine? They need to know if you have any of these conditions:  an unusual or allergic reaction to fremanezumab, other medicines, foods, dyes, or preservatives  pregnant or trying to get pregnant  breast-feeding How should I use this medicine? This medicine is for injection under the skin. You will be taught how to prepare and give this medicine. Use exactly as directed. Take your medicine at regular intervals. Do not take your medicine more often than directed. It is important that you put your used needles and syringes in a special sharps container. Do not put them in a trash can. If you do not have a sharps container, call your pharmacist or healthcare provider to get one. Talk to your pediatrician regarding the use of this medicine in children. Special care may be needed. Overdosage: If you think you have taken too much of this medicine contact a poison control center or emergency room at once. NOTE: This medicine is only for you. Do not share this medicine with others. What if I miss a dose? If you miss a dose, take it as soon as you can. If it is almost time for your next dose, take only that dose. Do not take double or extra doses. What may interact with this medicine? Interactions are not expected. This list may not describe all possible interactions. Give your health care provider a list of all the medicines, herbs, non-prescription drugs, or dietary supplements you use. Also tell them if you smoke, drink alcohol, or use illegal drugs. Some items may interact with your medicine. What should I watch for while using this  medicine? Tell your doctor or healthcare professional if your symptoms do not start to get better or if they get worse. What side effects may I notice from receiving this medicine? Side effects that you should report to your doctor or health care professional as soon as possible:  allergic reactions like skin rash, itching or hives, swelling of the face, lips, or tongue Side effects that usually do not require medical attention (report these to your doctor or health care professional if they continue or are bothersome):  pain, redness, or irritation at site where injected This list may not describe all possible side effects. Call your doctor for medical advice about side effects. You may report side effects to FDA at 1-800-FDA-1088. Where should I keep my medicine? Keep out of the reach of children. You will be instructed on how to store this medicine. Throw away any unused medicine after the expiration date on the label. NOTE: This sheet is a summary. It may not cover all possible information. If you have questions about this medicine, talk to your doctor, pharmacist, or health care provider.  2020 Elsevier/Gold Standard (2017-02-17 17:22:56)  Ubrogepant tablets What is this medicine? UBROGEPANT (ue BROE je pant) is used to treat migraine headaches with or without aura. An aura is a strange feeling or visual disturbance that warns you of an attack. It is not used to prevent migraines. This medicine may be used for other purposes; ask your health care provider or pharmacist if you have questions. COMMON BRAND NAME(S): Ubrelvy What should I  tell my health care provider before I take this medicine? They need to know if you have any of these conditions:  kidney disease  liver disease  an unusual or allergic reaction to ubrogepant, other medicines, foods, dyes, or preservatives  pregnant or trying to get pregnant  breast-feeding How should I use this medicine? Take this medicine by  mouth with a glass of water. Follow the directions on the prescription label. You can take it with or without food. If it upsets your stomach, take it with food. Take your medicine at regular intervals. Do not take it more often than directed. Do not stop taking except on your doctor's advice. Talk to your pediatrician about the use of this medicine in children. Special care may be needed. Overdosage: If you think you have taken too much of this medicine contact a poison control center or emergency room at once. NOTE: This medicine is only for you. Do not share this medicine with others. What if I miss a dose? This does not apply. This medicine is not for regular use. What may interact with this medicine? Do not take this medicine with any of the following medicines:  ceritinib  certain antibiotics like chloramphenicol, clarithromycin, telithromycin  certain antivirals for HIV like atazanavir, cobicistat, darunavir, delavirdine, fosamprenavir, indinavir, ritonavir  certain medicines for fungal infections like itraconazole, ketoconazole, posaconazole, voriconazole  conivaptan  grapefruit  idelalisib  mifepristone  nefazodone  ribociclib This medicine may also interact with the following medications:  carvedilol  certain medicines for seizures like phenobarbital, phenytoin  ciprofloxacin  cyclosporine  eltrombopag  fluconazole  fluvoxamine  quinidine  rifampin  St. John's wort  verapamil This list may not describe all possible interactions. Give your health care provider a list of all the medicines, herbs, non-prescription drugs, or dietary supplements you use. Also tell them if you smoke, drink alcohol, or use illegal drugs. Some items may interact with your medicine. What should I watch for while using this medicine? Visit your health care professional for regular checks on your progress. Tell your health care professional if your symptoms do not start to get  better or if they get worse. Your mouth may get dry. Chewing sugarless gum or sucking hard candy and drinking plenty of water may help. Contact your health care professional if the problem does not go away or is severe. What side effects may I notice from receiving this medicine? Side effects that you should report to your doctor or health care professional as soon as possible:  allergic reactions like skin rash, itching or hives; swelling of the face, lips, or tongue Side effects that usually do not require medical attention (report these to your doctor or health care professional if they continue or are bothersome):  drowsiness  dry mouth  nausea  tiredness This list may not describe all possible side effects. Call your doctor for medical advice about side effects. You may report side effects to FDA at 1-800-FDA-1088. Where should I keep my medicine? Keep out of the reach of children. Store at room temperature between 15 and 30 degrees C (59 and 86 degrees F). Throw away any unused medicine after the expiration date. NOTE: This sheet is a summary. It may not cover all possible information. If you have questions about this medicine, talk to your doctor, pharmacist, or health care provider.  2020 Elsevier/Gold Standard (2018-08-06 08:50:55)

## 2020-01-11 ENCOUNTER — Telehealth: Payer: Self-pay | Admitting: Neurology

## 2020-01-11 NOTE — Telephone Encounter (Signed)
Received PA request from pharmacy for Ajovy. PA was started on [email or web address]. Key is [id number]. PA was approved from 12/12/19 to 01/10/21. Pharmacy is aware of approval.

## 2020-02-02 DIAGNOSIS — E039 Hypothyroidism, unspecified: Secondary | ICD-10-CM | POA: Diagnosis not present

## 2020-03-28 ENCOUNTER — Other Ambulatory Visit: Payer: Self-pay

## 2020-03-28 ENCOUNTER — Ambulatory Visit (INDEPENDENT_AMBULATORY_CARE_PROVIDER_SITE_OTHER): Payer: BC Managed Care – PPO | Admitting: *Deleted

## 2020-03-28 DIAGNOSIS — G43711 Chronic migraine without aura, intractable, with status migrainosus: Secondary | ICD-10-CM | POA: Diagnosis not present

## 2020-03-28 NOTE — Progress Notes (Signed)
Patientwasgiven her 3 scheduled injections of Ajovy 225 mg each. Total 675 mg. We used samples today. The patient will pickup the next injections from the pharmacy. I spoke with [facility] today and confirmed they will order the Ajovy and it will be $5 with copay card. Today's injections were administered inRUO arm per patient request. Patient tolerated wellandaseptic technique used. Next injectionsscheduled due on 06/29/2019.   LOT: [id number]  EXP: [id number]

## 2020-05-03 ENCOUNTER — Encounter: Payer: Self-pay | Admitting: Adult Health

## 2020-05-03 ENCOUNTER — Ambulatory Visit: Payer: BC Managed Care – PPO | Admitting: Adult Health

## 2020-05-03 VITALS — BP 114/70 | Ht 62.0 in | Wt 214.6 lb

## 2020-05-03 DIAGNOSIS — G43711 Chronic migraine without aura, intractable, with status migrainosus: Secondary | ICD-10-CM | POA: Diagnosis not present

## 2020-05-03 MED ORDER — UBRELVY 50 MG PO TABS: ORAL_TABLET | ORAL | 3 refills | Status: DC

## 2020-05-03 NOTE — Patient Instructions (Signed)
Your Plan:  Continue Ajovy for prevention  Ubrelvy for acute headache If your symptoms worsen or you develop new symptoms please let us know.     Thank you for coming to see [place] at [facility]. I hope we have been able to provide you high quality care today.  You may receive a patient satisfaction survey over the next few weeks. We would appreciate your feedback and comments so that we may continue to improve ourselves and the health of our patients.

## 2020-05-03 NOTE — Progress Notes (Addendum)
PATIENT: Briana Kelley DOB: 20-Aug-1960  REASON FOR VISIT: follow up HISTORY FROM: patient  HISTORY OF PRESENT ILLNESS: Today 05/03/20:  Ms. Sherman is a 59 year old female with a history of migraine headaches.  She returns today for follow-up.  At the last visit she was started on [place].  Reports that she received 3 injections in October.  She states that since then she has only had 3 severe headaches.  Reports that she did use Ubrelvy and found it beneficial.  She is pleased with her current treatment.  She does have Amerge but found that [name] works better.  HISTORY ALIYA SOL is a 59 y.o. female here as requested by Laurann Montana, MD for headaches.  She has a PMHx of: Diabetes, hypothyroidism, dry eye,  migraines, hypercholesterolemia, glaucoma, cataracts, depression, sleep apnea status post surgery, iron deficiency anemia, fatty liver.  I reviewed Dr. Lucilla Lame notes: She has had migraines all her life, she is seen multiple neurologists including a neurologist at [facility] Dr. Henry Russel and Dr. Vela Prose in the past.  She is on verapamil and amitriptyline for her migraines and I was able to review prior neurology notes, patient revealed that headaches were getting worse, over 30 days she had 3 severe headaches, 8-12 moderate headaches and at least daily mild headaches, they tend to start moderately and then will build up to a more severe state at times, she is used Migranal nasal spray found it with some benefit, she has used Fioricet sparingly in the past, headaches are often located diffusely, sharp pain with nausea, light and sound sensitivity worsening of the head with movement, pulsating pounding, she has been on triptan's including Relpax, Maxalt, Frova, Zomig and Imitrex and she had difficulty tolerating Imitrex, she is also been tried on Zomig, verapamil.  I also reviewed neurology examination which was normal including ophthalmologic, cranial nerve, sensory, muscle,  cerebellum, gait.  Also physical exam was normal including cardiovascular, respiratory, head, throat, neck, lungs, abdomen.  She did not wish to proceed with Botox.  Last appointment her Zonegran was increased, she was continued on her current dose of verapamil, zonisamide was increased to 400 mg at bedtime, Migranal nasal spray and Fioricet as acute management, also Skelaxin was given for neck muscle tightness.  Patient is here and reports she has had migraines in tension headaches "all my life", she reports she always has a headache, this has been ongoing since her youngest was born in 1999, she is tried everything verapamil, amitriptyline, ibuprofen, Aleve, Fioricet, Amerge, baclofen, Zofran and nothing is helping. Daily headaches. Migraines start in the temples, can be unilateral or start on both sides, pressure and throbbing, light and sound sensitivity, nausea, hurts to move, at least 10 moderate or severe migraine days a month, migraines can last 24-72 hours, going to bed helps, certain foods such as lots of garlic, processed meats, she has a lot of nausea with the migraines and sharp pains may happen, they can start slowly or some can be acute and severe. She has had multiple brain imaging studies throughout the years which were unremarkable, worse after the meningitis/encephalitis in 1999, over the last several years she has noticed worsening of her symptoms. Very sensitive scalp can;t even touch it it is so sensitive. She had surgery bc of sleep apnea which helped. She couldn't wear the cpap and declines. She gets swelling around the head with severe headaches and lots sensitivity of the scalp. Verapamil helps. No other focal neurologic deficits,  associated symptoms, inciting events or modifiable factors. She has no aura, we discussed what an aura is and she does not have them.   From a review of records patient has tried the following medications that can be used in migraine management: Verapamil,  amitriptyline, Migranal, ibuprofen, Aleve, Fioricet, Amerge, baclofen, Zofran, Relpax, Maxalt, Frova, Zomig, Imitrex, Zonegran, Skelaxin, Tylenol, promethazine, prozac, Topamax(side effects)and many more things over the years  Reviewed notes, labs and imaging from outside physicians, which showed: tsh 04/2019 normal,    REVIEW OF SYSTEMS: Out of a complete 14 system review of symptoms, the patient complains only of the following symptoms, and all other reviewed systems are negative.  See HPI  ALLERGIES: Allergies  Allergen Reactions  . Aspirin Shortness Of Breath, Nausea Only and Other (See Comments)    "cold sweats and migraines" (05/13/2012) Cold sweats and shakes. Also gets migraine   . Celecoxib Shortness Of Breath and Nausea Only    Cold sweats and shakes. Also get migraine   . Cinnamon Shortness Of Breath    Choking, coughing   . Cinnamon Flavor Anaphylaxis    sneezing  . Codeine Nausea Only, Nausea And Vomiting and Shortness Of Breath    Gives her migraines, makes sick  . Nitroglycerin Nausea And Vomiting    Chills and migraine; patient stayed in hospital one week after given Nitroglycerin  . Nortriptyline Shortness Of Breath    Jaw pain  . Oxycodone-Acetaminophen Shortness Of Breath    Cold sweats and shakes. Hallucinations and bleeding   . Percocet [Oxycodone-Acetaminophen] Nausea Only and Other (See Comments)    Halucinations; "and blood in my urine" (05/13/2012)  . Sumatriptan Shortness Of Breath  . Ultram [Tramadol] Shortness Of Breath  . Pamelor [Nortriptyline Hcl] Other (See Comments)    Jaw pain   . 2,4-D Dimethylamine (Amisol)     Cold sweats and shakes.   [place] Pepper [Empagliflozin]     Yeast infection    . Tramadol Hcl Nausea Only    Cold sweats and shakes. Also gets migraine  . Wellbutrin Xl [Bupropion]     Memory loss    HOME MEDICATIONS: Outpatient Medications Prior to Visit  Medication Sig Dispense Refill  . amitriptyline (ELAVIL) 25 MG  tablet Take 75 mg by mouth at bedtime.     Marland Kitchen atorvastatin (LIPITOR) 20 MG tablet Take 20 mg by mouth daily.  3  . baclofen (LIORESAL) 10 MG tablet TAKE 1 TABLET THREE TIMES A DAY AS NEEDED MUSCLE TIGHTNESS  2  . butalbital-acetaminophen-caffeine (FIORICET WITH CODEINE) 50-325-40-30 MG per capsule Take 1 capsule by mouth every 4 (four) hours as needed. For headache    . cholecalciferol (VITAMIN D) 1000 UNITS tablet Take 5,000 Units by mouth 2 (two) times daily.     . cycloSPORINE (RESTASIS OP) Place 1 drop into both eyes in the morning and at bedtime.    . Flaxseed, Linseed, (FLAX SEED OIL PO) Take 2 tablets by mouth daily.     . Fremanezumab-vfrm (AJOVY) 225 MG/1.5ML SOAJ Inject 675 mg into the skin every 3 (three) months. 3 pen 3  . Fremanezumab-vfrm (AJOVY) 225 MG/1.5ML SOAJ Inject 675 mg into the skin every 3 (three) months. 3 pen 3  . glimepiride (AMARYL) 4 MG tablet Take 4 mg by mouth 2 (two) times daily.     Marland Kitchen ibuprofen (ADVIL,MOTRIN) 600 MG tablet Take 1 tablet (600 mg total) by mouth 3 (three) times daily as needed for pain. 30 tablet 0  . lisinopril (PRINIVIL,ZESTRIL)  5 MG tablet Take 5 mg by mouth daily.     . Lutein-Zeaxanthin 25-5 MG CAPS Take 1 capsule by mouth daily.    . metFORMIN (GLUCOPHAGE) 500 MG tablet Take 2,000 mg by mouth daily.    . naratriptan (AMERGE) 2.5 MG tablet Take 2.5 mg by mouth daily as needed. Take one (1) tablet at onset of headache; if returns or does not resolve, may repeat after 4 hours; do not exceed five (5) mg in 24 hours.    . ondansetron (ZOFRAN) 8 MG tablet     . pioglitazone (ACTOS) 30 MG tablet Take 30 mg by mouth daily.    . promethazine (PHENERGAN) 12.5 MG tablet Take 1 tablet (12.5 mg total) by mouth every 6 (six) hours as needed for nausea. 30 tablet 0  . Semaglutide (RYBELSUS) 14 MG TABS Take 1 tablet by mouth daily.    . Thyroid (LEVOTHYROXINE-LIOTHYRONINE) 90 MG TABS 1 tablet by mouth in addition to 1 tablet of Armour Thyroid 15 mg daily.   Total daily dose = 105 mg    . Ubrogepant (UBRELVY) 50 MG TABS Take 50 mg by mouth daily as needed. Max 50mg  a day 4 tablet 0  . verapamil (CALAN-SR) 240 MG CR tablet Take 240 mg by mouth 2 (two) times daily.     . Thyroid (LEVOTHYROXINE-LIOTHYRONINE) 15 MG TABS 1 tablet by mouth in addition to 1 tablet of Armour Thyroid 90mg  daily.  Total daily dose = 105 mg     No facility-administered medications prior to visit.    PAST MEDICAL HISTORY: Past Medical History:  Diagnosis Date  . Asymptomatic gallstones   . Bleeding 05/31/2015  . Bruises easily   . Cataract   . Chronic bronchitis (HCC)    "most likely q yr" (05/13/2012)  . Encephalitis 1999  . Fatty liver    on CT   . GERD (gastroesophageal reflux disease)    pt unaware of this   . Gestational hypertension 1996  . Glaucoma   . Grand mal seizure [facility](HCC) 1999   "related to encephalitis and meningitis" (05/13/2012)  . Heart murmur   . History of blood transfusion 2008   "related to low iron" (05/13/2012)  . Hypercholesteremia   . Hyperlipidemia   . Hypothyroidism   . Iron deficiency anemia 2008  . Iron deficiency anemia 05/31/2015  . Iron malabsorption 05/31/2015  . Meningitis   . Migraine    "chronic" (05/13/2012)  . Multinodular goiter    Dr. Lisabeth DevoidBallan  . Pneumonia    "several times" (05/13/2012)  . Situational depression 2010   after major surgery, daughters were suicidal  . Sleep apnea    does not wear mask  . Type II diabetes mellitus (HCC)   . Umbilical hernia    small  . Vertigo     PAST SURGICAL HISTORY: Past Surgical History:  Procedure Laterality Date  . ABDOMINAL HYSTERECTOMY  2008   partial  . BILATERAL OOPHERECTOMY     for cysts, puctured cyst, were unable to remove the fibrous tissue  . CARPAL TUNNEL RELEASE  1989   left wrist  . CESAREAN SECTION  [id number]  . CHOLECYSTECTOMY  05/13/2012   laparoscopic  . CHOLECYSTECTOMY  05/13/2012   Procedure: LAPAROSCOPIC CHOLECYSTECTOMY WITH INTRAOPERATIVE  CHOLANGIOGRAM;  Surgeon: Romie LeveeAlicia Thomas, MD;  [facility];  Service: General;  Laterality: N/A;  . COLONOSCOPY  05/30/1960, 03/03/2013  . DILATION AND CURETTAGE OF UTERUS  1997; 1998  . ESOPHAGOGASTRODUODENOSCOPY  10/2013  . HERNIA  REPAIR  1997   umbilical  . KNEE ARTHROSCOPY W/ MENISCAL REPAIR  1999   left knee  . OVARIAN CYST REMOVAL  2009  . REDUCTION MAMMAPLASTY  2001   breast reduction  . TONSILLECTOMY AND ADENOIDECTOMY  ~ 2000  . TUBAL LIGATION  1999  . WISDOM TOOTH EXTRACTION  1980's?  . WRIST SURGERY Left    s/p dislocation, initially closed then open    FAMILY HISTORY: Family History  Problem Relation Age of Onset  . Breast cancer Mother   . Diabetes Mother   . Breast cancer Maternal Aunt   . CAD Maternal Grandmother   . Diabetes Father   . Diabetes Paternal Aunt   . Lung cancer Maternal Uncle   . Migraines Daughter   . Migraines Daughter     SOCIAL HISTORY: Social History   Socioeconomic History  . Marital status: Divorced    Spouse name: Not on file  . Number of children: 2  . Years of education: Not on file  . Highest education level: Not on file  Occupational History  . Not on file  Tobacco Use  . Smoking status: Never Smoker  . Smokeless tobacco: Never Used  Vaping Use  . Vaping Use: Never used  Substance and Sexual Activity  . Alcohol use: No    Alcohol/week: 0.0 standard drinks  . Drug use: No  . Sexual activity: Never    Birth control/protection: Surgical  Other Topics Concern  . Not on file  Social History Narrative   Her children live with her    Right handed   Caffeine: 4 cups/day   Social Determinants of Health   Financial Resource Strain:   . Difficulty of Paying Living Expenses: Not on file  Food Insecurity:   . Worried About Programme researcher, broadcasting/film/video in the Last Year: Not on file  . Ran Out of Food in the Last Year: Not on file  Transportation Needs:   . Lack of Transportation (Medical): Not on file  . Lack of Transportation  (Non-Medical): Not on file  Physical Activity:   . Days of Exercise per Week: Not on file  . Minutes of Exercise per Session: Not on file  Stress:   . Feeling of Stress : Not on file  Social Connections:   . Frequency of Communication with Friends and Family: Not on file  . Frequency of Social Gatherings with Friends and Family: Not on file  . Attends Religious Services: Not on file  . Active Member of Clubs or Organizations: Not on file  . Attends Banker Meetings: Not on file  . Marital Status: Not on file  Intimate Partner Violence:   . Fear of Current or Ex-Partner: Not on file  . Emotionally Abused: Not on file  . Physically Abused: Not on file  . Sexually Abused: Not on file      PHYSICAL EXAM  Vitals:   05/03/20 1423  BP: 114/70  Weight: 214 lb 9.6 oz (97.3 kg)  Height: 5\' 2"  (1.575 m)   Body mass index is 39.25 kg/m.  Generalized: Well developed, in no acute distress   Neurological examination  Mentation: Alert oriented to time, place, history taking. Follows all commands speech and language fluent Cranial nerve II-XII: Pupils were equal round reactive to light. Extraocular movements were full, visual field were full on confrontational test. Head turning and shoulder shrug  were normal and symmetric. Motor: The motor testing reveals 5 over 5 strength of all 4  extremities. Good symmetric motor tone is noted throughout.  Sensory: Sensory testing is intact to soft touch on all 4 extremities. No evidence of extinction is noted.  Coordination: Cerebellar testing reveals good finger-nose-finger and heel-to-shin bilaterally.  Gait and station: Gait is normal.  Reflexes: Deep tendon reflexes are symmetric and normal bilaterally.   DIAGNOSTIC DATA (LABS, IMAGING, TESTING) - I reviewed patient records, labs, notes, testing and imaging myself where available.  Lab Results  Component Value Date   WBC 9.3 11/17/2017   HGB 14.5 11/17/2017   HCT 44.1  11/17/2017   MCV 91.9 11/17/2017   PLT 299 11/17/2017      Component Value Date/Time   NA 135 (L) 11/18/2016 1029   K 4.8 11/18/2016 1029   CL 105 05/20/2012 0500   CO2 25 11/18/2016 1029   GLUCOSE 213 (H) 11/18/2016 1029   BUN 18.1 11/18/2016 1029   CREATININE 0.8 11/18/2016 1029   CALCIUM 10.6 (H) 11/18/2016 1029   PROT 7.5 11/18/2016 1029   ALBUMIN 3.8 11/18/2016 1029   AST 20 11/18/2016 1029   ALT 26 11/18/2016 1029   ALKPHOS 88 11/18/2016 1029   BILITOT 0.33 11/18/2016 1029   GFRNONAA >90 05/20/2012 0500   GFRAA >90 05/20/2012 0500      ASSESSMENT AND PLAN 60 y.o. year old female  has a past medical history of Asymptomatic gallstones, Bleeding (05/31/2015), Bruises easily, Cataract, Chronic bronchitis (HCC), Encephalitis (1999), Fatty liver, GERD (gastroesophageal reflux disease), Gestational hypertension (1996), Glaucoma, Grand mal seizure (HCC) (1999), Heart murmur, History of blood transfusion (2008), Hypercholesteremia, Hyperlipidemia, Hypothyroidism, Iron deficiency anemia (2008), Iron deficiency anemia (05/31/2015), Iron malabsorption (05/31/2015), Meningitis, Migraine, Multinodular goiter, Pneumonia, Situational depression (2010), Sleep apnea, Type II diabetes mellitus (HCC), Umbilical hernia, and Vertigo. here with:  1.  Migraine headaches  --Continue Ajovy every 3 months (3 injections) --Continue Ubrelvy as an abortive therapy--50 mg at the onset of a migraine can repeat in 2 hours if needed. --Advised that if her headache frequency or severity worsen she should let us know --Follow-up in 6 months or sooner if needed   I spent 30 minutes of face-to-face and non-face-to-face time with patient.  This included previsit chart review, lab review, study review, order entry, electronic health record documentation, patient education.  Butch Penny, MSN, NP-C 05/03/2020, 2:39 PM [facility] [address] [place], [place] [address] [phone]  Made any corrections needed, and agree with history, physical, neuro exam,assessment and plan as stated.     Naomie Dean, MD [facility]

## 2020-05-04 DIAGNOSIS — Z23 Encounter for immunization: Secondary | ICD-10-CM | POA: Diagnosis not present

## 2020-05-04 DIAGNOSIS — E1169 Type 2 diabetes mellitus with other specified complication: Secondary | ICD-10-CM | POA: Diagnosis not present

## 2020-05-04 DIAGNOSIS — E785 Hyperlipidemia, unspecified: Secondary | ICD-10-CM | POA: Diagnosis not present

## 2020-05-04 DIAGNOSIS — G43009 Migraine without aura, not intractable, without status migrainosus: Secondary | ICD-10-CM | POA: Diagnosis not present

## 2020-05-04 DIAGNOSIS — N181 Chronic kidney disease, stage 1: Secondary | ICD-10-CM | POA: Diagnosis not present

## 2020-05-16 DIAGNOSIS — E039 Hypothyroidism, unspecified: Secondary | ICD-10-CM | POA: Diagnosis not present

## 2020-05-16 DIAGNOSIS — E063 Autoimmune thyroiditis: Secondary | ICD-10-CM | POA: Diagnosis not present

## 2020-05-20 ENCOUNTER — Ambulatory Visit: Payer: BC Managed Care – PPO

## 2020-06-28 ENCOUNTER — Ambulatory Visit (INDEPENDENT_AMBULATORY_CARE_PROVIDER_SITE_OTHER): Payer: BC Managed Care – PPO | Admitting: *Deleted

## 2020-06-28 DIAGNOSIS — G43711 Chronic migraine without aura, intractable, with status migrainosus: Secondary | ICD-10-CM

## 2020-06-28 NOTE — Progress Notes (Signed)
Pt here for AJOVY injections.  Under aseptic technique AJOVY 225mg /1.42ml autoinjectors x 3  Injected into skin R arm .  4m  Tolerated well.  Will see her back in 3 months.  Did not want to do herself, or into stomach.   Pt brought with her NDC Marland Kitchen, LOT [id number] Exp 07/2021.

## 2020-08-29 ENCOUNTER — Other Ambulatory Visit: Payer: Self-pay | Admitting: *Deleted

## 2020-08-29 DIAGNOSIS — G43711 Chronic migraine without aura, intractable, with status migrainosus: Secondary | ICD-10-CM

## 2020-08-29 MED ORDER — AJOVY 225 MG/1.5ML ~~LOC~~ SOAJ: 675.0000 mg | SUBCUTANEOUS | 3 refills | Status: DC

## 2020-09-08 ENCOUNTER — Other Ambulatory Visit (HOSPITAL_BASED_OUTPATIENT_CLINIC_OR_DEPARTMENT_OTHER): Payer: Self-pay | Admitting: Family Medicine

## 2020-09-08 DIAGNOSIS — Z1231 Encounter for screening mammogram for malignant neoplasm of breast: Secondary | ICD-10-CM

## 2020-09-14 ENCOUNTER — Other Ambulatory Visit: Payer: Self-pay

## 2020-09-14 ENCOUNTER — Encounter (HOSPITAL_BASED_OUTPATIENT_CLINIC_OR_DEPARTMENT_OTHER): Payer: Self-pay

## 2020-09-14 ENCOUNTER — Ambulatory Visit (HOSPITAL_BASED_OUTPATIENT_CLINIC_OR_DEPARTMENT_OTHER)
Admission: RE | Admit: 2020-09-14 | Discharge: 2020-09-14 | Disposition: A | Discharge status: Home / Self Care | Payer: BC Managed Care – PPO | Source: Ambulatory Visit | Attending: Family Medicine | Admitting: Family Medicine

## 2020-09-14 DIAGNOSIS — Z1231 Encounter for screening mammogram for malignant neoplasm of breast: Secondary | ICD-10-CM | POA: Diagnosis not present

## 2020-09-26 ENCOUNTER — Ambulatory Visit (INDEPENDENT_AMBULATORY_CARE_PROVIDER_SITE_OTHER): Payer: BC Managed Care – PPO | Admitting: *Deleted

## 2020-09-26 DIAGNOSIS — G43711 Chronic migraine without aura, intractable, with status migrainosus: Secondary | ICD-10-CM | POA: Diagnosis not present

## 2020-09-26 NOTE — Progress Notes (Signed)
Pt here for AJOVY injections.  Under aseptic technique AJOVY 225mg /1.61ml autoinjectors x 3.  Injected into skin R arm 2 autoinjectors, 1 in the L arm.  .  Tolerated well.  Will see her back in 3 months.  Did not want to do herself, leg or into stomach.  Pt brought with her NDC 4m, LOT [id number] Exp 01/2022.

## 2020-11-01 ENCOUNTER — Encounter: Payer: Self-pay | Admitting: Adult Health

## 2020-11-01 ENCOUNTER — Ambulatory Visit: Payer: BC Managed Care – PPO | Admitting: Adult Health

## 2020-11-01 VITALS — BP 120/72 | HR 112 | Ht 62.0 in | Wt 216.0 lb

## 2020-11-01 DIAGNOSIS — G43711 Chronic migraine without aura, intractable, with status migrainosus: Secondary | ICD-10-CM

## 2020-11-01 MED ORDER — UBRELVY 50 MG PO TABS: ORAL_TABLET | ORAL | 3 refills | Status: DC

## 2020-11-01 NOTE — Patient Instructions (Signed)
Your Plan:  Continue Briana Kelley sent to pharmacy If your symptoms worsen or you develop new symptoms please let us know.    Thank you for coming to see [place] at [facility]. I hope we have been able to provide you high quality care today.  You may receive a patient satisfaction survey over the next few weeks. We would appreciate your feedback and comments so that we may continue to improve ourselves and the health of our patients.

## 2020-11-01 NOTE — Progress Notes (Signed)
PATIENT: Briana Kelley DOB: 08/25/1960  REASON FOR VISIT: follow up HISTORY FROM: patient  HISTORY OF PRESENT ILLNESS: Today 11/01/20:  Briana Kelley is a 60 year old female with a history of migraine headaches.  She returns today for follow-up.  She is currently on Ajovy.  She reports that she has approximately 1 migraine a month.  This migraine can sometimes last for days.  She was taking [place] but ran out of the prescription.  She has taken Amerge in the past but felt that [name] works better.  She returns today for an evaluation.  05/03/20: Briana Kelley is a 60 year old female with a history of migraine headaches.  She returns today for follow-up.  At the last visit she was started on [place].  Reports that she received 3 injections in October.  She states that since then she has only had 3 severe headaches.  Reports that she did use Ubrelvy and found it beneficial.  She is pleased with her current treatment.  She does have Amerge but found that [name] works better.  HISTORY Briana Kelley is a 60 y.o. female here as requested by Laurann Montana, MD for headaches.  She has a PMHx of: Diabetes, hypothyroidism, dry eye,  migraines, hypercholesterolemia, glaucoma, cataracts, depression, sleep apnea status post surgery, iron deficiency anemia, fatty liver.  I reviewed Dr. Lucilla Lame notes: She has had migraines all her life, she is seen multiple neurologists including a neurologist at [facility] Dr. Henry Russel and Dr. Vela Prose in the past.  She is on verapamil and amitriptyline for her migraines and I was able to review prior neurology notes, patient revealed that headaches were getting worse, over 30 days she had 3 severe headaches, 8-12 moderate headaches and at least daily mild headaches, they tend to start moderately and then will build up to a more severe state at times, she is used Migranal nasal spray found it with some benefit, she has used Fioricet sparingly in the past, headaches are  often located diffusely, sharp pain with nausea, light and sound sensitivity worsening of the head with movement, pulsating pounding, she has been on triptan's including Relpax, Maxalt, Frova, Zomig and Imitrex and she had difficulty tolerating Imitrex, she is also been tried on Zomig, verapamil.  I also reviewed neurology examination which was normal including ophthalmologic, cranial nerve, sensory, muscle, cerebellum, gait.  Also physical exam was normal including cardiovascular, respiratory, head, throat, neck, lungs, abdomen.  She did not wish to proceed with Botox.  Last appointment her Zonegran was increased, she was continued on her current dose of verapamil, zonisamide was increased to 400 mg at bedtime, Migranal nasal spray and Fioricet as acute management, also Skelaxin was given for neck muscle tightness.  Patient is here and reports she has had migraines in tension headaches "all my life", she reports she always has a headache, this has been ongoing since her youngest was born in 1999, she is tried everything verapamil, amitriptyline, ibuprofen, Aleve, Fioricet, Amerge, baclofen, Zofran and nothing is helping. Daily headaches. Migraines start in the temples, can be unilateral or start on both sides, pressure and throbbing, light and sound sensitivity, nausea, hurts to move, at least 10 moderate or severe migraine days a month, migraines can last 24-72 hours, going to bed helps, certain foods such as lots of garlic, processed meats, she has a lot of nausea with the migraines and sharp pains may happen, they can start slowly or some can be acute and severe. She has had  multiple brain imaging studies throughout the years which were unremarkable, worse after the meningitis/encephalitis in 1999, over the last several years she has noticed worsening of her symptoms. Very sensitive scalp can;t even touch it it is so sensitive. She had surgery bc of sleep apnea which helped. She couldn't wear the cpap and  declines. She gets swelling around the head with severe headaches and lots sensitivity of the scalp. Verapamil helps. No other focal neurologic deficits, associated symptoms, inciting events or modifiable factors. She has no aura, we discussed what an aura is and she does not have them.   From a review of records patient has tried the following medications that can be used in migraine management: Verapamil, amitriptyline, Migranal, ibuprofen, Aleve, Fioricet, Amerge, baclofen, Zofran, Relpax, Maxalt, Frova, Zomig, Imitrex, Zonegran, Skelaxin, Tylenol, promethazine, prozac, Topamax(side effects)and many more things over the years  Reviewed notes, labs and imaging from outside physicians, which showed: tsh 04/2019 normal,    REVIEW OF SYSTEMS: Out of a complete 14 system review of symptoms, the patient complains only of the following symptoms, and all other reviewed systems are negative.  See HPI  ALLERGIES: Allergies  Allergen Reactions  . Aspirin Shortness Of Breath, Nausea Only and Other (See Comments)    "cold sweats and migraines" (05/13/2012) Cold sweats and shakes. Also gets migraine   . Celecoxib Shortness Of Breath and Nausea Only    Cold sweats and shakes. Also get migraine   . Cinnamon Shortness Of Breath    Choking, coughing   . Cinnamon Flavor Anaphylaxis    sneezing  . Codeine Nausea Only, Nausea And Vomiting and Shortness Of Breath    Gives her migraines, makes sick  . Nitroglycerin Nausea And Vomiting    Chills and migraine; patient stayed in hospital one week after given Nitroglycerin  . Nortriptyline Shortness Of Breath    Jaw pain  . Oxycodone-Acetaminophen Shortness Of Breath    Cold sweats and shakes. Hallucinations and bleeding   . Percocet [Oxycodone-Acetaminophen] Nausea Only and Other (See Comments)    Halucinations; "and blood in my urine" (05/13/2012)  . Sumatriptan Shortness Of Breath  . Ultram [Tramadol] Shortness Of Breath  . Pamelor  [Nortriptyline Hcl] Other (See Comments)    Jaw pain   . 2,4-D Dimethylamine (Amisol)     Cold sweats and shakes.   [place] Pepper [Empagliflozin]     Yeast infection    . Tramadol Hcl Nausea Only    Cold sweats and shakes. Also gets migraine  . Wellbutrin Xl [Bupropion]     Memory loss    HOME MEDICATIONS: Outpatient Medications Prior to Visit  Medication Sig Dispense Refill  . amitriptyline (ELAVIL) 25 MG tablet Take 75 mg by mouth at bedtime.     Marland Kitchen atorvastatin (LIPITOR) 20 MG tablet Take 20 mg by mouth daily.  3  . baclofen (LIORESAL) 10 MG tablet TAKE 1 TABLET THREE TIMES A DAY AS NEEDED MUSCLE TIGHTNESS  2  . butalbital-acetaminophen-caffeine (FIORICET WITH CODEINE) 50-325-40-30 MG per capsule Take 1 capsule by mouth every 4 (four) hours as needed. For headache    . cholecalciferol (VITAMIN D) 1000 UNITS tablet Take 5,000 Units by mouth 2 (two) times daily.     . cycloSPORINE (RESTASIS OP) Place 1 drop into both eyes in the morning and at bedtime.    . Flaxseed, Linseed, (FLAX SEED OIL PO) Take 2 tablets by mouth daily.     . Fremanezumab-vfrm (AJOVY) 225 MG/1.5ML SOAJ Inject 675 mg into the  skin every 3 (three) months. 3 pen 3  . Fremanezumab-vfrm (AJOVY) 225 MG/1.5ML SOAJ Inject 675 mg into the skin every 3 (three) months. 4.5 mL 3  . glimepiride (AMARYL) 4 MG tablet Take 4 mg by mouth 2 (two) times daily.     Marland Kitchen ibuprofen (ADVIL,MOTRIN) 600 MG tablet Take 1 tablet (600 mg total) by mouth 3 (three) times daily as needed for pain. 30 tablet 0  . lisinopril (PRINIVIL,ZESTRIL) 5 MG tablet Take 5 mg by mouth daily.     . Lutein-Zeaxanthin 25-5 MG CAPS Take 1 capsule by mouth daily.    . metFORMIN (GLUCOPHAGE) 500 MG tablet Take 2,000 mg by mouth daily.    . naratriptan (AMERGE) 2.5 MG tablet Take 2.5 mg by mouth daily as needed. Take one (1) tablet at onset of headache; if returns or does not resolve, may repeat after 4 hours; do not exceed five (5) mg in 24 hours.    . ondansetron  (ZOFRAN) 8 MG tablet     . pioglitazone (ACTOS) 30 MG tablet Take 30 mg by mouth daily.    . promethazine (PHENERGAN) 12.5 MG tablet Take 1 tablet (12.5 mg total) by mouth every 6 (six) hours as needed for nausea. 30 tablet 0  . Semaglutide (RYBELSUS) 14 MG TABS Take 1 tablet by mouth daily.    . Thyroid (LEVOTHYROXINE-LIOTHYRONINE) 90 MG TABS 1 tablet by mouth in addition to 1 tablet of [place] Thyroid 15 mg daily.  Total daily dose = 105 mg    . Ubrogepant (UBRELVY) 50 MG TABS Take 1 tablet at the onset of migraine. Can repeat in 2 hours if needed. Only 2 tabs in 24 hours 30 tablet 3  . verapamil (CALAN-SR) 240 MG CR tablet Take 240 mg by mouth 2 (two) times daily.      No facility-administered medications prior to visit.    PAST MEDICAL HISTORY: Past Medical History:  Diagnosis Date  . Asymptomatic gallstones   . Bleeding 05/31/2015  . Bruises easily   . Cataract   . Chronic bronchitis (HCC)    "most likely q yr" (05/13/2012)  . Encephalitis 1999  . Fatty liver    on CT   . GERD (gastroesophageal reflux disease)    pt unaware of this   . Gestational hypertension 1996  . Glaucoma   . Grand mal seizure [facility]) 1999   "related to encephalitis and meningitis" (05/13/2012)  . Heart murmur   . History of blood transfusion 2008   "related to low iron" (05/13/2012)  . Hypercholesteremia   . Hyperlipidemia   . Hypothyroidism   . Iron deficiency anemia 2008  . Iron deficiency anemia 05/31/2015  . Iron malabsorption 05/31/2015  . Meningitis   . Migraine    "chronic" (05/13/2012)  . Multinodular goiter    Dr. Lisabeth Devoid  . Pneumonia    "several times" (05/13/2012)  . Situational depression 2010   after major surgery, daughters were suicidal  . Sleep apnea    does not wear mask  . Type II diabetes mellitus (HCC)   . Umbilical hernia    small  . Vertigo     PAST SURGICAL HISTORY: Past Surgical History:  Procedure Laterality Date  . ABDOMINAL HYSTERECTOMY  2008   partial  .  BILATERAL OOPHERECTOMY     for cysts, puctured cyst, were unable to remove the fibrous tissue  . CARPAL TUNNEL RELEASE  1989   left wrist  . CESAREAN SECTION  [id number]  . CHOLECYSTECTOMY  05/13/2012   laparoscopic  . CHOLECYSTECTOMY  05/13/2012   Procedure: LAPAROSCOPIC CHOLECYSTECTOMY WITH INTRAOPERATIVE CHOLANGIOGRAM;  Surgeon: Romie LeveeAlicia Thomas, MD;  [facility];  Service: General;  Laterality: N/A;  . COLONOSCOPY  06/13/1959, 03/03/2013  . DILATION AND CURETTAGE OF UTERUS  1997; 1998  . ESOPHAGOGASTRODUODENOSCOPY  10/2013  . HERNIA REPAIR  1997   umbilical  . KNEE ARTHROSCOPY W/ MENISCAL REPAIR  1999   left knee  . OVARIAN CYST REMOVAL  2009  . REDUCTION MAMMAPLASTY Bilateral 2001   breast reduction  . TONSILLECTOMY AND ADENOIDECTOMY  ~ 2000  . TUBAL LIGATION  1999  . WISDOM TOOTH EXTRACTION  1980's?  . WRIST SURGERY Left    s/p dislocation, initially closed then open    FAMILY HISTORY: Family History  Problem Relation Age of Onset  . Breast cancer Mother   . Diabetes Mother   . Breast cancer Maternal Aunt   . CAD Maternal Grandmother   . Diabetes Father   . Diabetes Paternal Aunt   . Lung cancer Maternal Uncle   . Migraines Daughter   . Migraines Daughter     SOCIAL HISTORY: Social History   Socioeconomic History  . Marital status: Divorced    Spouse name: Not on file  . Number of children: 2  . Years of education: Not on file  . Highest education level: Not on file  Occupational History  . Not on file  Tobacco Use  . Smoking status: Never Smoker  . Smokeless tobacco: Never Used  Vaping Use  . Vaping Use: Never used  Substance and Sexual Activity  . Alcohol use: No    Alcohol/week: 0.0 standard drinks  . Drug use: No  . Sexual activity: Never    Birth control/protection: Surgical  Other Topics Concern  . Not on file  Social History Narrative   Her children live with her    Right handed   Caffeine: 4 cups/day   Social Determinants of Health    Financial Resource Strain: Not on file  Food Insecurity: Not on file  Transportation Needs: Not on file  Physical Activity: Not on file  Stress: Not on file  Social Connections: Not on file  Intimate Partner Violence: Not on file      PHYSICAL EXAM  Vitals:   11/01/20 1452  BP: 120/72  Pulse: (!) 112  Weight: 216 lb (98 kg)  Height: 5\' 2"  (1.575 m)   Body mass index is 39.51 kg/m.  Generalized: Well developed, in no acute distress   Neurological examination  Mentation: Alert oriented to time, place, history taking. Follows all commands speech and language fluent Cranial nerve II-XII: Pupils were equal round reactive to light. Extraocular movements were full, visual field were full on confrontational test. Head turning and shoulder shrug  were normal and symmetric. Motor: The motor testing reveals 5 over 5 strength of all 4 extremities. Good symmetric motor tone is noted throughout.  Sensory: Sensory testing is intact to soft touch on all 4 extremities. No evidence of extinction is noted.  Coordination: Cerebellar testing reveals good finger-nose-finger and heel-to-shin bilaterally.  Gait and station: Gait is normal.  Reflexes: Deep tendon reflexes are symmetric and normal bilaterally.   DIAGNOSTIC DATA (LABS, IMAGING, TESTING) - I reviewed patient records, labs, notes, testing and imaging myself where available.  Lab Results  Component Value Date   WBC 9.3 11/17/2017   HGB 14.5 11/17/2017   HCT 44.1 11/17/2017   MCV 91.9 11/17/2017   PLT 299  11/17/2017      Component Value Date/Time   NA 135 (L) 11/18/2016 1029   K 4.8 11/18/2016 1029   CL 105 05/20/2012 0500   CO2 25 11/18/2016 1029   GLUCOSE 213 (H) 11/18/2016 1029   BUN 18.1 11/18/2016 1029   CREATININE 0.8 11/18/2016 1029   CALCIUM 10.6 (H) 11/18/2016 1029   PROT 7.5 11/18/2016 1029   ALBUMIN 3.8 11/18/2016 1029   AST 20 11/18/2016 1029   ALT 26 11/18/2016 1029   ALKPHOS 88 11/18/2016 1029   BILITOT  0.33 11/18/2016 1029   GFRNONAA >90 05/20/2012 0500   GFRAA >90 05/20/2012 0500      ASSESSMENT AND PLAN 60 y.o. year old female  has a past medical history of Asymptomatic gallstones, Bleeding (05/31/2015), Bruises easily, Cataract, Chronic bronchitis (HCC), Encephalitis (1999), Fatty liver, GERD (gastroesophageal reflux disease), Gestational hypertension (1996), Glaucoma, Grand mal seizure (HCC) (1999), Heart murmur, History of blood transfusion (2008), Hypercholesteremia, Hyperlipidemia, Hypothyroidism, Iron deficiency anemia (2008), Iron deficiency anemia (05/31/2015), Iron malabsorption (05/31/2015), Meningitis, Migraine, Multinodular goiter, Pneumonia, Situational depression (2010), Sleep apnea, Type II diabetes mellitus (HCC), Umbilical hernia, and Vertigo. here with:  1.  Migraine headaches  --Continue Ajovy every 3 months (3 injections) --Continue Ubrelvy as an abortive therapy. --Advised that if her headache frequency or severity worsen she should let us know --Follow-up in 1 year or sooner if needed   Butch Penny, MSN, NP-C 11/01/2020, 3:01 PM [facility] [address], [address] [place], [place] [address] [phone]  Made any corrections needed, and agree with history, physical, neuro exam,assessment and plan as stated.     Naomie Dean, MD [facility]

## 2020-11-02 DIAGNOSIS — E785 Hyperlipidemia, unspecified: Secondary | ICD-10-CM | POA: Diagnosis not present

## 2020-11-02 DIAGNOSIS — D509 Iron deficiency anemia, unspecified: Secondary | ICD-10-CM | POA: Diagnosis not present

## 2020-11-02 DIAGNOSIS — G43009 Migraine without aura, not intractable, without status migrainosus: Secondary | ICD-10-CM | POA: Diagnosis not present

## 2020-11-02 DIAGNOSIS — E1169 Type 2 diabetes mellitus with other specified complication: Secondary | ICD-10-CM | POA: Diagnosis not present

## 2020-11-09 ENCOUNTER — Telehealth: Payer: Self-pay | Admitting: *Deleted

## 2020-11-09 NOTE — Telephone Encounter (Signed)
Received fax from [facility] re: [name] PA, she has tried/failed many medications, is allergic to many. Clinical questions answered, signed and faxed to [facility].

## 2020-11-14 ENCOUNTER — Encounter: Payer: Self-pay | Admitting: *Deleted

## 2020-11-14 NOTE — Telephone Encounter (Signed)
Called [facility] to check status of [place], case # [id number] is still pending review. Sent my chart to update patient.

## 2020-11-22 NOTE — Telephone Encounter (Signed)
Called [facility] to check status of Briana Kelley, case [id number] . Still pending review.

## 2020-11-23 ENCOUNTER — Encounter: Payer: Self-pay | Admitting: *Deleted

## 2020-11-23 NOTE — Telephone Encounter (Signed)
Received fax from [facility], stated unable to approve medication, didn;t receive information for providers office. Called [facility] answered question, med approved, case id # [id number], approved 10/24/20 -11/23/2021. Sent patient my chart to advise.

## 2020-12-26 ENCOUNTER — Ambulatory Visit (INDEPENDENT_AMBULATORY_CARE_PROVIDER_SITE_OTHER): Payer: BC Managed Care – PPO | Admitting: *Deleted

## 2020-12-26 DIAGNOSIS — G43711 Chronic migraine without aura, intractable, with status migrainosus: Secondary | ICD-10-CM

## 2020-12-26 NOTE — Progress Notes (Signed)
Pt here for AJOVY injections.  Under aseptic technique AJOVY 225mg /1.69ml autoinjectors x 3.  Injected into skin R arm 1 autoinjector, 2 in the L arm.  .  Tolerated well.  Will see her back in 3 months.  Did not want to do herself.  Pt brought with her NDC 4m, LOT [id number] Exp 03/2022.

## 2021-01-30 ENCOUNTER — Telehealth: Payer: Self-pay

## 2021-01-30 NOTE — Telephone Encounter (Signed)
PA for Ajovy submitted on CMM, Key: [facility] - PA Case ID: [id number].  Approved  CaseId:[id number];Status:Approved;Review Type:Prior Auth;Coverage Start Date:12/31/2020;Coverage End Date:01/30/2022

## 2021-03-28 ENCOUNTER — Ambulatory Visit (INDEPENDENT_AMBULATORY_CARE_PROVIDER_SITE_OTHER): Payer: BC Managed Care – PPO | Admitting: *Deleted

## 2021-03-28 DIAGNOSIS — G43711 Chronic migraine without aura, intractable, with status migrainosus: Secondary | ICD-10-CM

## 2021-03-28 NOTE — Progress Notes (Signed)
Pt here for Ajovy injections.  Under aseptic technique Ajovy 225mg /1.52ml autoinjectors x 3 injected into skin R arm x 1 autoinjector, 2 in the L arm. Pt tolerated well.  Will see her back in 3 months.  Did not want to do herself.  Pt brought with her NDC 4m, LOT [id number] Exp 04/2022.

## 2021-05-04 DIAGNOSIS — E785 Hyperlipidemia, unspecified: Secondary | ICD-10-CM | POA: Diagnosis not present

## 2021-05-04 DIAGNOSIS — E1165 Type 2 diabetes mellitus with hyperglycemia: Secondary | ICD-10-CM | POA: Diagnosis not present

## 2021-05-04 DIAGNOSIS — E039 Hypothyroidism, unspecified: Secondary | ICD-10-CM | POA: Diagnosis not present

## 2021-05-04 DIAGNOSIS — G43909 Migraine, unspecified, not intractable, without status migrainosus: Secondary | ICD-10-CM | POA: Diagnosis not present

## 2021-05-16 DIAGNOSIS — Z79899 Other long term (current) drug therapy: Secondary | ICD-10-CM | POA: Diagnosis not present

## 2021-05-16 DIAGNOSIS — E063 Autoimmune thyroiditis: Secondary | ICD-10-CM | POA: Diagnosis not present

## 2021-05-16 DIAGNOSIS — E039 Hypothyroidism, unspecified: Secondary | ICD-10-CM | POA: Diagnosis not present

## 2021-05-16 DIAGNOSIS — E785 Hyperlipidemia, unspecified: Secondary | ICD-10-CM | POA: Diagnosis not present

## 2021-06-28 ENCOUNTER — Ambulatory Visit (INDEPENDENT_AMBULATORY_CARE_PROVIDER_SITE_OTHER): Payer: BC Managed Care – PPO | Admitting: *Deleted

## 2021-06-28 DIAGNOSIS — G43711 Chronic migraine without aura, intractable, with status migrainosus: Secondary | ICD-10-CM

## 2021-06-28 NOTE — Progress Notes (Signed)
Pt here for Ajovy injections.  Under aseptic technique Ajovy 225mg /1.2ml autoinjectors x 3 injected into skin of L arm. Pt tolerated well.  Will see her back in 3 months. Pt brought medication with her   [place] [id number], LOT [id number].   Next appt 09/26/21 at 11:45 AM.  Current insurance authorization on file through 01/31/2022. Pt states insurance has not changed.

## 2021-08-06 ENCOUNTER — Other Ambulatory Visit: Payer: Self-pay | Admitting: Neurology

## 2021-08-06 DIAGNOSIS — G43711 Chronic migraine without aura, intractable, with status migrainosus: Secondary | ICD-10-CM

## 2021-09-26 ENCOUNTER — Ambulatory Visit: Payer: BC Managed Care – PPO

## 2021-11-01 ENCOUNTER — Ambulatory Visit: Payer: BC Managed Care – PPO | Admitting: Adult Health

## 2021-11-01 ENCOUNTER — Encounter: Payer: Self-pay | Admitting: Adult Health

## 2021-11-01 VITALS — BP 104/69 | HR 85 | Ht 62.0 in | Wt 214.0 lb

## 2021-11-01 DIAGNOSIS — G43711 Chronic migraine without aura, intractable, with status migrainosus: Secondary | ICD-10-CM

## 2021-11-01 NOTE — Patient Instructions (Signed)
Your Plan: ? ?Continue ? ? ? ? ?Thank you for coming to see us at [facility]. I hope we have been able to provide you high quality care today. ? ?You may receive a patient satisfaction survey over the next few weeks. We would appreciate your feedback and comments so that we may continue to improve ourselves and the health of our patients. ? ?

## 2021-11-01 NOTE — Progress Notes (Signed)
PATIENT: Briana Kelley DOB: 02-04-1961  REASON FOR VISIT: follow up HISTORY FROM: patient  HISTORY OF PRESENT ILLNESS: Today 11/01/21:  Ms. Briana Kelley is a 61 year old female with a history of migraine headaches.  She returns today for follow-up.  She reports that Briana Kelley is working well for her.  She continues to have 1 migraine a month.  Briana Kelley also works well for her.  The patient states that her migraines are more intense.  She states that she was working from home full-time but now has to go into the office 3 days a week.  She is not happy about that.  11/01/20: Briana Kelley is a 61 year old female with a history of migraine headaches.  She returns today for follow-up.  She is currently on Ajovy.  She reports that she has approximately 1 migraine a month.  This migraine can sometimes last for days.  She was taking [place] but ran out of the prescription.  She has taken Amerge in the past but felt that Briana Kelley works better.  She returns today for an evaluation.  05/03/20: Briana Kelley is a 61 year old female with a history of migraine headaches.  She returns today for follow-up.  At the last visit she was started on [place].  Reports that she received 3 injections in October.  She states that since then she has only had 3 severe headaches.  Reports that she did use Ubrelvy and found it beneficial.  She is pleased with her current treatment.  She does have Amerge but found that Briana Kelley works better.  HISTORY Briana Kelley is a 61 y.o. female here as requested by Laurann Montana, MD for headaches.  She has a PMHx of: Diabetes, hypothyroidism, dry eye,  migraines, hypercholesterolemia, glaucoma, cataracts, depression, sleep apnea status post surgery, iron deficiency anemia, fatty liver.  I reviewed Dr. Lucilla Lame notes: She has had migraines all her life, she is seen multiple neurologists including a neurologist at [facility] Dr. Henry Russel and Dr. Vela Prose in the past.  She is on verapamil and  amitriptyline for her migraines and I was able to review prior neurology notes, patient revealed that headaches were getting worse, over 30 days she had 3 severe headaches, 8-12 moderate headaches and at least daily mild headaches, they tend to start moderately and then will build up to a more severe state at times, she is used Migranal nasal spray found it with some benefit, she has used Fioricet sparingly in the past, headaches are often located diffusely, sharp pain with nausea, light and sound sensitivity worsening of the head with movement, pulsating pounding, she has been on triptan's including Relpax, Maxalt, Frova, Zomig and Imitrex and she had difficulty tolerating Imitrex, she is also been tried on Zomig, verapamil.  I also reviewed neurology examination which was normal including ophthalmologic, cranial nerve, sensory, muscle, cerebellum, gait.  Also physical exam was normal including cardiovascular, respiratory, head, throat, neck, lungs, abdomen.  She did not wish to proceed with Botox.  Last appointment her Zonegran was increased, she was continued on her current dose of verapamil, zonisamide was increased to 400 mg at bedtime, Migranal nasal spray and Fioricet as acute management, also Skelaxin was given for neck muscle tightness.   Patient is here and reports she has had migraines in tension headaches "all my life", she reports she always has a headache, this has been ongoing since her youngest was born in 1999, she is tried everything verapamil, amitriptyline, ibuprofen, Aleve, Fioricet, Amerge, baclofen, Zofran  and nothing is helping. Daily headaches. Migraines start in the temples, can be unilateral or start on both sides, pressure and throbbing, light and sound sensitivity, nausea, hurts to move, at least 10 moderate or severe migraine days a month, migraines can last 24-72 hours, going to bed helps, certain foods such as lots of garlic, processed meats, she has a lot of nausea with the  migraines and sharp pains may happen, they can start slowly or some can be acute and severe. She has had multiple brain imaging studies throughout the years which were unremarkable, worse after the meningitis/encephalitis in 1999, over the last several years she has noticed worsening of her symptoms. Very sensitive scalp can;t even touch it it is so sensitive. She had surgery bc of sleep apnea which helped. She couldn't wear the cpap and declines. She gets swelling around the head with severe headaches and lots sensitivity of the scalp. Verapamil helps. No other focal neurologic deficits, associated symptoms, inciting events or modifiable factors. She has no aura, we discussed what an aura is and she does not have them.    From a review of records patient has tried the following medications that can be used in migraine management: Verapamil, amitriptyline, Migranal, ibuprofen, Aleve, Fioricet, Amerge, baclofen, Zofran, Relpax, Maxalt, Frova, Zomig, Imitrex, Zonegran, Skelaxin, Tylenol, promethazine, prozac, Topamax(side effects)and many more things over the years   Reviewed notes, labs and imaging from outside physicians, which showed: tsh 04/2019 normal,     REVIEW OF SYSTEMS: Out of a complete 14 system review of symptoms, the patient complains only of the following symptoms, and all other reviewed systems are negative.  See HPI  ALLERGIES: Allergies  Allergen Reactions   Aspirin Shortness Of Breath, Nausea Only and Other (See Comments)    "cold sweats and migraines" (05/13/2012) Cold sweats and shakes. Also gets migraine    Celecoxib Shortness Of Breath and Nausea Only    Cold sweats and shakes. Also get migraine    Cinnamon Shortness Of Breath    Choking, coughing    Cinnamon Flavor Anaphylaxis    sneezing   Codeine Nausea Only, Nausea And Vomiting and Shortness Of Breath    Gives her migraines, makes sick   Nitroglycerin Nausea And Vomiting    Chills and migraine; patient stayed in  hospital one week after given Nitroglycerin   Nortriptyline Shortness Of Breath    Jaw pain   Oxycodone-Acetaminophen Shortness Of Breath    Cold sweats and shakes. Hallucinations and bleeding    Percocet [Oxycodone-Acetaminophen] Nausea Only and Other (See Comments)    Halucinations; "and blood in my urine" (05/13/2012)   Sumatriptan Shortness Of Breath   Ultram [Tramadol] Shortness Of Breath   Pamelor [Nortriptyline Hcl] Other (See Comments)    Jaw pain    2,4-D Dimethylamine     Cold sweats and shakes.    Jardiance [Empagliflozin]     Yeast infection     Levothyroxine     Ineffective    Tramadol Hcl Nausea Only    Cold sweats and shakes. Also gets migraine   Wellbutrin Xl [Bupropion]     Memory loss    HOME MEDICATIONS: Outpatient Medications Prior to Visit  Medication Sig Dispense Refill   AJOVY 225 MG/1.5ML SOAJ INJECT 675 MG UNDER THE SKIN EVERY 3 MONTHS 4.5 mL 0   amitriptyline (ELAVIL) 25 MG tablet Take 75 mg by mouth at bedtime.      atorvastatin (LIPITOR) 20 MG tablet Take 20 mg by mouth daily.  3   baclofen (LIORESAL) 10 MG tablet TAKE 1 TABLET THREE TIMES A DAY AS NEEDED MUSCLE TIGHTNESS  2   butalbital-acetaminophen-caffeine (FIORICET WITH CODEINE) 50-325-40-30 MG per capsule Take 1 capsule by mouth every 4 (four) hours as needed. For headache     cholecalciferol (VITAMIN D) 1000 UNITS tablet Take 5,000 Units by mouth 2 (two) times daily.      cycloSPORINE (RESTASIS OP) Place 1 drop into both eyes in the morning and at bedtime.     Flaxseed, Linseed, (FLAX SEED OIL PO) Take 2 tablets by mouth daily.      glimepiride (AMARYL) 4 MG tablet Take 4 mg by mouth 2 (two) times daily.      ibuprofen (ADVIL,MOTRIN) 600 MG tablet Take 1 tablet (600 mg total) by mouth 3 (three) times daily as needed for pain. 30 tablet 0   lisinopril (PRINIVIL,ZESTRIL) 5 MG tablet Take 5 mg by mouth daily.      Lutein-Zeaxanthin 25-5 MG CAPS Take 1 capsule by mouth daily.     metFORMIN  (GLUCOPHAGE) 500 MG tablet Take 2,000 mg by mouth daily.     ondansetron (ZOFRAN) 8 MG tablet      pioglitazone (ACTOS) 30 MG tablet Take 30 mg by mouth daily.     promethazine (PHENERGAN) 12.5 MG tablet Take 1 tablet (12.5 mg total) by mouth every 6 (six) hours as needed for nausea. 30 tablet 0   Semaglutide (RYBELSUS) 14 MG TABS Take 1 tablet by mouth daily.     thyroid (ARMOUR) 90 MG tablet Take 90 mg by mouth daily.     Ubrogepant (UBRELVY) 50 MG TABS Take 1 tablet at the onset of migraine. Can repeat in 2 hours if needed. Only 2 tabs in 24 hours 15 tablet 3   verapamil (CALAN-SR) 240 MG CR tablet Take 240 mg by mouth 2 (two) times daily.      No facility-administered medications prior to visit.    PAST MEDICAL HISTORY: Past Medical History:  Diagnosis Date   Asymptomatic gallstones    Bleeding 05/31/2015   Bruises easily    Cataract    Chronic bronchitis (HCC)    "most likely q yr" (05/13/2012)   Encephalitis 1999   Fatty liver    on CT    GERD (gastroesophageal reflux disease)    pt unaware of this    Gestational hypertension 1996   Glaucoma    Grand mal seizure (HCC) 1999   "related to encephalitis and meningitis" (05/13/2012)   Heart murmur    History of blood transfusion 2008   "related to low iron" (05/13/2012)   Hypercholesteremia    Hyperlipidemia    Hypothyroidism    Iron deficiency anemia 2008   Iron deficiency anemia 05/31/2015   Iron malabsorption 05/31/2015   Meningitis    Migraine    "chronic" (05/13/2012)   Multinodular goiter    Dr. Lisabeth DevoidBallan   Pneumonia    "several times" (05/13/2012)   Situational depression 2010   after major surgery, daughters were suicidal   Sleep apnea    does not wear mask   Type II diabetes mellitus (HCC)    Umbilical hernia    small   Vertigo     PAST SURGICAL HISTORY: Past Surgical History:  Procedure Laterality Date   ABDOMINAL HYSTERECTOMY  2008   partial   BILATERAL OOPHERECTOMY     for cysts, puctured cyst,  were unable to remove the fibrous tissue   CARPAL TUNNEL RELEASE  1989  left wrist   CESAREAN SECTION  [id number]   CHOLECYSTECTOMY  05/13/2012   laparoscopic   CHOLECYSTECTOMY  05/13/2012   Procedure: LAPAROSCOPIC CHOLECYSTECTOMY WITH INTRAOPERATIVE CHOLANGIOGRAM;  Surgeon: Romie Levee, MD;  Location: [facility] OR;  Service: General;  Laterality: N/A;   COLONOSCOPY  05/13/1960, 03/03/2013   DILATION AND CURETTAGE OF UTERUS  1997; 1998   ESOPHAGOGASTRODUODENOSCOPY  10/2013   HERNIA REPAIR  1997   umbilical   KNEE ARTHROSCOPY W/ MENISCAL REPAIR  1999   left knee   OVARIAN CYST REMOVAL  2009   REDUCTION MAMMAPLASTY Bilateral 2001   breast reduction   TONSILLECTOMY AND ADENOIDECTOMY  ~ 2000   TUBAL LIGATION  1999   WISDOM TOOTH EXTRACTION  1980's?   WRIST SURGERY Left    s/p dislocation, initially closed then open    FAMILY HISTORY: Family History  Problem Relation Age of Onset   Breast cancer Mother    Diabetes Mother    Breast cancer Maternal Aunt    CAD Maternal Grandmother    Diabetes Father    Diabetes Paternal Aunt    Lung cancer Maternal Uncle    Migraines Daughter    Migraines Daughter     SOCIAL HISTORY: Social History   Socioeconomic History   Marital status: Divorced    Spouse name: Not on file   Number of children: 2   Years of education: Not on file   Highest education level: Not on file  Occupational History   Not on file  Tobacco Use   Smoking status: Never   Smokeless tobacco: Never  Vaping Use   Vaping Use: Never used  Substance and Sexual Activity   Alcohol use: No    Alcohol/week: 0.0 standard drinks   Drug use: No   Sexual activity: Never    Birth control/protection: Surgical  Other Topics Concern   Not on file  Social History Narrative   Her children live with her    Right handed   Caffeine: 4 cups/day   Social Determinants of Health   Financial Resource Strain: Not on file  Food Insecurity: Not on file  Transportation Needs: Not on file   Physical Activity: Not on file  Stress: Not on file  Social Connections: Not on file  Intimate Partner Violence: Not on file      PHYSICAL EXAM  Vitals:   11/01/21 1438  BP: 104/69  Pulse: 85  Weight: 214 lb (97.1 kg)  Height:  (1.575 m)    Body mass index is 39.14 kg/m.  Generalized: Well developed, in no acute distress   Neurological examination  Mentation: Alert oriented to time, place, history taking. Follows all commands speech and language fluent Cranial nerve II-XII: Pupils were equal round reactive to light. Extraocular movements were full, visual field were full on confrontational test. Head turning and shoulder shrug  were normal and symmetric. Motor: The motor testing reveals 5 over 5 strength of all 4 extremities. Good symmetric motor tone is noted throughout.  Sensory: Sensory testing is intact to soft touch on all 4 extremities. No evidence of extinction is noted.  Coordination: Cerebellar testing reveals good finger-nose-finger and heel-to-shin bilaterally.  Gait and station: Gait is normal.  Reflexes: Deep tendon reflexes are symmetric and normal bilaterally.   DIAGNOSTIC DATA (LABS, IMAGING, TESTING) - I reviewed patient records, labs, notes, testing and imaging myself where available.  Lab Results  Component Value Date   WBC 9.3 11/17/2017   HGB 14.5 11/17/2017   HCT 44.1  11/17/2017   MCV 91.9 11/17/2017   PLT 299 11/17/2017      Component Value Date/Time   NA 135 (L) 11/18/2016 1029   K 4.8 11/18/2016 1029   CL 105 05/20/2012 0500   CO2 25 11/18/2016 1029   GLUCOSE 213 (H) 11/18/2016 1029   BUN 18.1 11/18/2016 1029   CREATININE 0.8 11/18/2016 1029   CALCIUM 10.6 (H) 11/18/2016 1029   PROT 7.5 11/18/2016 1029   ALBUMIN 3.8 11/18/2016 1029   AST 20 11/18/2016 1029   ALT 26 11/18/2016 1029   ALKPHOS 88 11/18/2016 1029   BILITOT 0.33 11/18/2016 1029   GFRNONAA >90 05/20/2012 0500   GFRAA >90 05/20/2012 0500      ASSESSMENT AND  PLAN 61 y.o. year old female  has a past medical history of Asymptomatic gallstones, Bleeding (05/31/2015), Bruises easily, Cataract, Chronic bronchitis (HCC), Encephalitis (1999), Fatty liver, GERD (gastroesophageal reflux disease), Gestational hypertension (1996), Glaucoma, Grand mal seizure (HCC) (1999), Heart murmur, History of blood transfusion (2008), Hypercholesteremia, Hyperlipidemia, Hypothyroidism, Iron deficiency anemia (2008), Iron deficiency anemia (05/31/2015), Iron malabsorption (05/31/2015), Meningitis, Migraine, Multinodular goiter, Pneumonia, Situational depression (2010), Sleep apnea, Type II diabetes mellitus (HCC), Umbilical hernia, and Vertigo. here with:  1.  Migraine headaches  --Continue Ajovy every 3 months (3 injections) --Continue Ubrelvy as an abortive therapy. --Advised that if her headache frequency or severity worsen she should let us know --Follow-up in 1 year or sooner if needed   Butch Penny, MSN, NP-C 11/01/2021, 8:04 AM [facility] [address], [address] [place], [place] [address] [phone]

## 2021-11-02 DIAGNOSIS — N181 Chronic kidney disease, stage 1: Secondary | ICD-10-CM | POA: Diagnosis not present

## 2021-11-02 DIAGNOSIS — E039 Hypothyroidism, unspecified: Secondary | ICD-10-CM | POA: Diagnosis not present

## 2021-11-02 DIAGNOSIS — E785 Hyperlipidemia, unspecified: Secondary | ICD-10-CM | POA: Diagnosis not present

## 2021-11-02 DIAGNOSIS — E1169 Type 2 diabetes mellitus with other specified complication: Secondary | ICD-10-CM | POA: Diagnosis not present

## 2021-11-05 ENCOUNTER — Other Ambulatory Visit: Payer: Self-pay | Admitting: Adult Health

## 2021-11-05 DIAGNOSIS — G43711 Chronic migraine without aura, intractable, with status migrainosus: Secondary | ICD-10-CM

## 2021-11-14 ENCOUNTER — Other Ambulatory Visit: Payer: Self-pay | Admitting: Adult Health

## 2022-01-21 NOTE — Telephone Encounter (Signed)
Completed Ajovy PA on Cover My Meds. Key: [id number].   Approved today Case Id: [id number]; Status: Approved; Review Type: Prior Auth; Coverage Start Date: 12/22/2021; Coverage End Date: 01/21/2023;

## 2022-02-05 DIAGNOSIS — E1169 Type 2 diabetes mellitus with other specified complication: Secondary | ICD-10-CM | POA: Diagnosis not present

## 2022-02-05 DIAGNOSIS — N181 Chronic kidney disease, stage 1: Secondary | ICD-10-CM | POA: Diagnosis not present

## 2022-02-05 DIAGNOSIS — E785 Hyperlipidemia, unspecified: Secondary | ICD-10-CM | POA: Diagnosis not present

## 2022-02-05 DIAGNOSIS — E039 Hypothyroidism, unspecified: Secondary | ICD-10-CM | POA: Diagnosis not present

## 2022-03-11 ENCOUNTER — Encounter: Payer: Self-pay | Admitting: *Deleted

## 2022-03-14 DIAGNOSIS — E039 Hypothyroidism, unspecified: Secondary | ICD-10-CM | POA: Diagnosis not present

## 2022-05-13 DIAGNOSIS — Z23 Encounter for immunization: Secondary | ICD-10-CM | POA: Diagnosis not present

## 2022-05-13 DIAGNOSIS — E1169 Type 2 diabetes mellitus with other specified complication: Secondary | ICD-10-CM | POA: Diagnosis not present

## 2022-05-13 DIAGNOSIS — E039 Hypothyroidism, unspecified: Secondary | ICD-10-CM | POA: Diagnosis not present

## 2022-05-13 DIAGNOSIS — N181 Chronic kidney disease, stage 1: Secondary | ICD-10-CM | POA: Diagnosis not present

## 2022-05-13 DIAGNOSIS — E785 Hyperlipidemia, unspecified: Secondary | ICD-10-CM | POA: Diagnosis not present

## 2022-05-13 DIAGNOSIS — Z79899 Other long term (current) drug therapy: Secondary | ICD-10-CM | POA: Diagnosis not present

## 2022-05-15 ENCOUNTER — Telehealth: Payer: Self-pay | Admitting: Adult Health

## 2022-05-15 DIAGNOSIS — E039 Hypothyroidism, unspecified: Secondary | ICD-10-CM | POA: Diagnosis not present

## 2022-05-15 NOTE — Telephone Encounter (Signed)
Pt called stating that there is a PA needed for her [name] and she would like to know when will this PA be processed.

## 2022-05-16 NOTE — Telephone Encounter (Signed)
Completed Ubrelvy PA on CMM. Key: [id number].   Approved today CaseId:[id number];Status:Approved;Review Type:Prior Auth;Coverage Start Date:04/16/2022;Coverage End Date:05/16/2023;

## 2022-05-24 DIAGNOSIS — E039 Hypothyroidism, unspecified: Secondary | ICD-10-CM | POA: Diagnosis not present

## 2022-08-09 DIAGNOSIS — E039 Hypothyroidism, unspecified: Secondary | ICD-10-CM | POA: Diagnosis not present

## 2022-10-31 IMAGING — MG MM DIGITAL SCREENING BILAT W/ TOMO AND CAD
8 series · 8 of 24 positions shown · non-contrast
Comparison: Previous exam(s).

CLINICAL DATA: Screening.

EXAM:
DIGITAL SCREENING BILATERAL MAMMOGRAM WITH TOMOSYNTHESIS AND CAD
TECHNIQUE: Bilateral screening digital craniocaudal and mediolateral oblique
mammograms were obtained. Bilateral screening digital breast
tomosynthesis was performed. The images were evaluated with
computer-aided detection.

[R CC synth-2D]
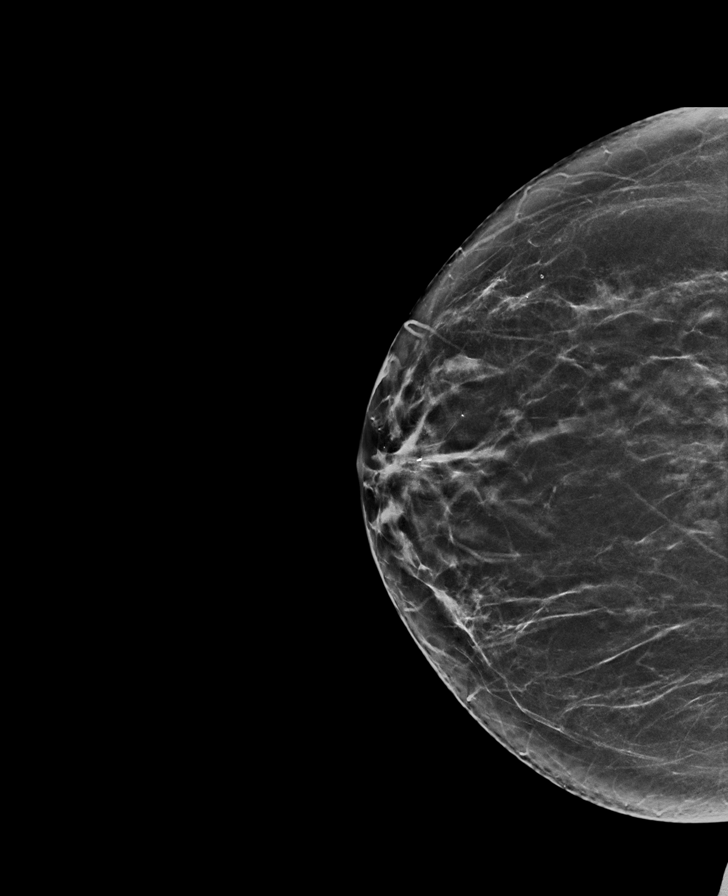

[L MLO synth-2D]
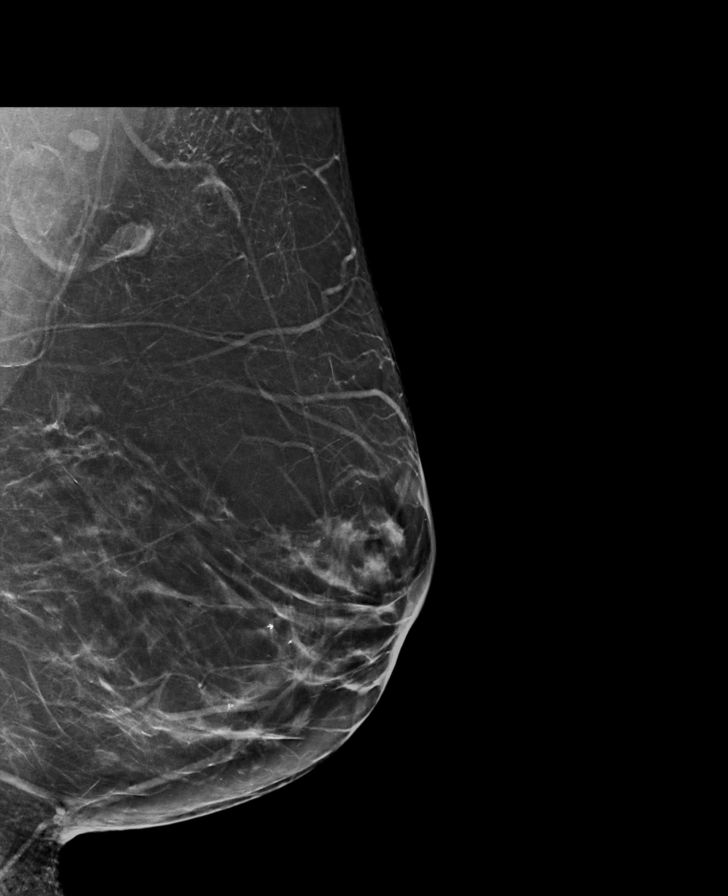

[R MLO synth-2D]
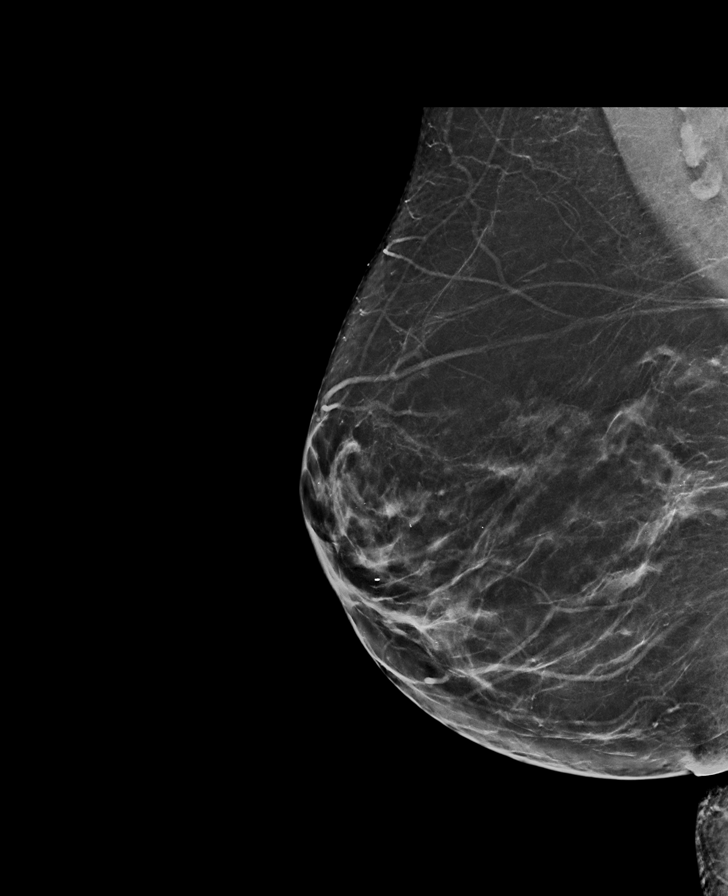

[L CC synth-2D]
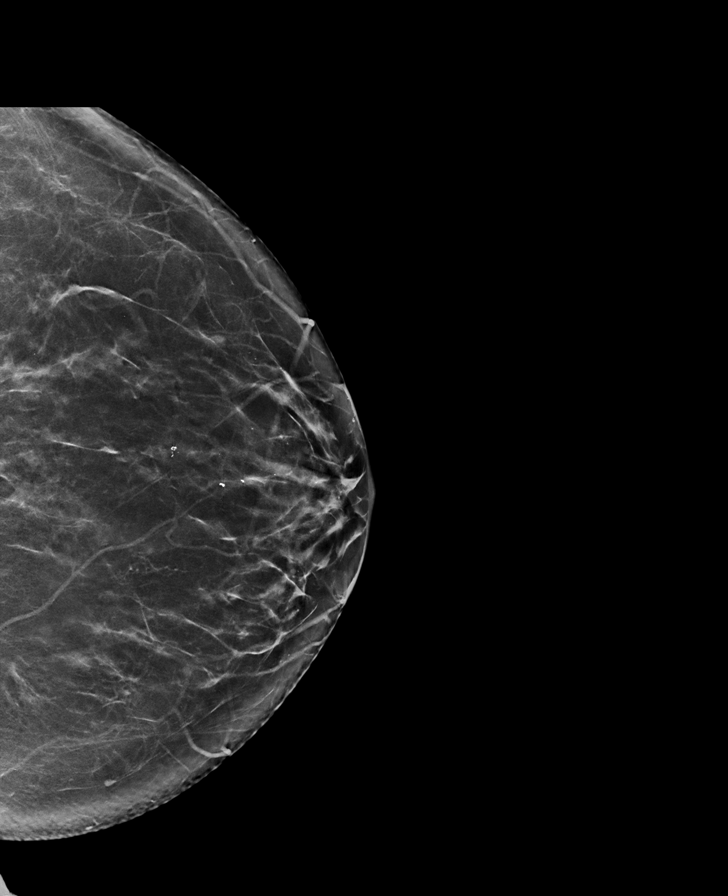

[L MLO tomo · tomo slice 42/83.0]
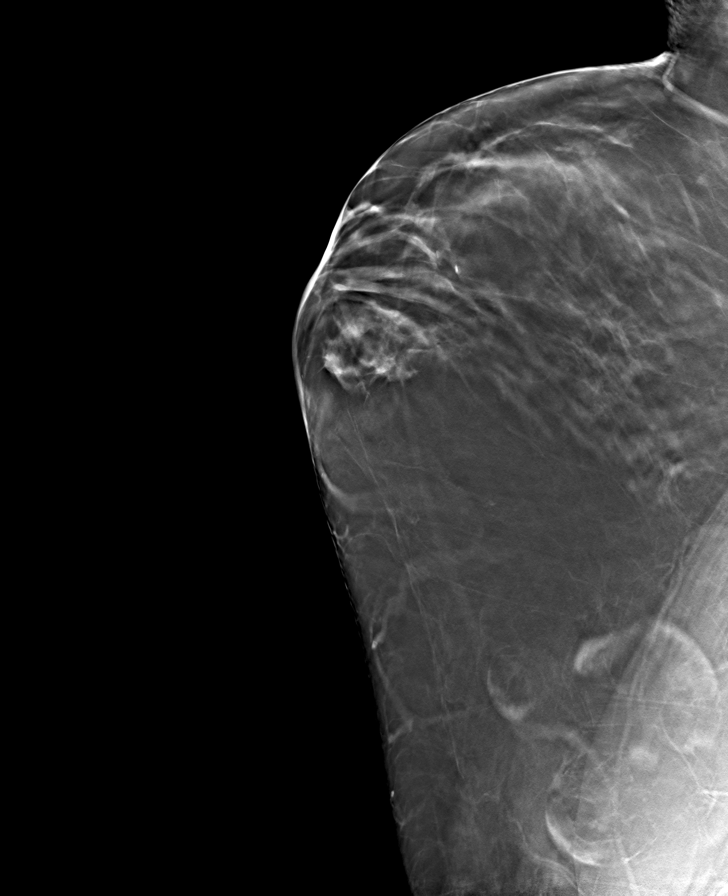

[R CC tomo · tomo slice 33/66.0]
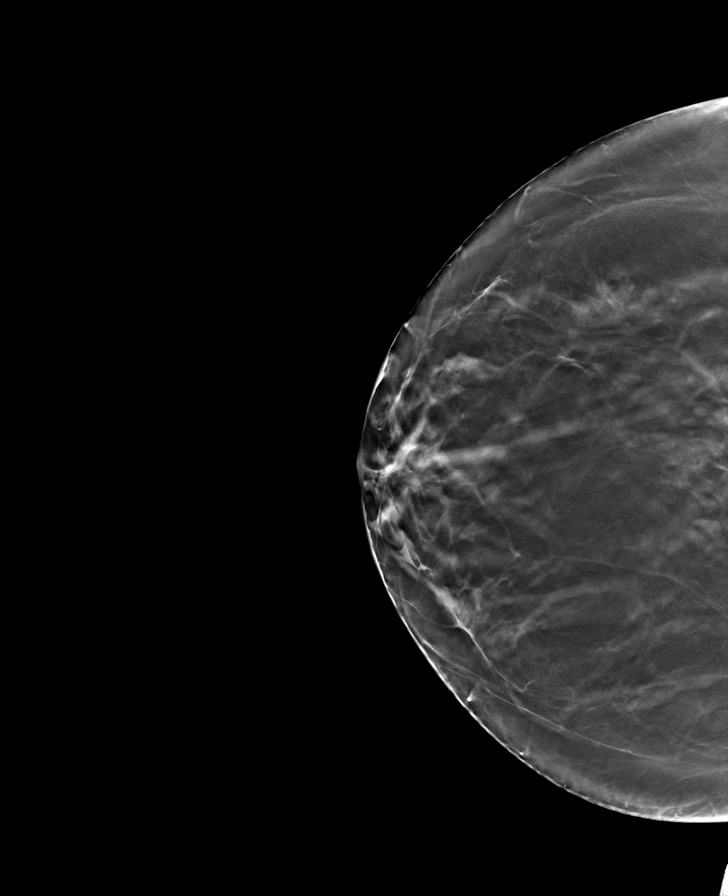

[R MLO tomo · tomo slice 38/75.0]
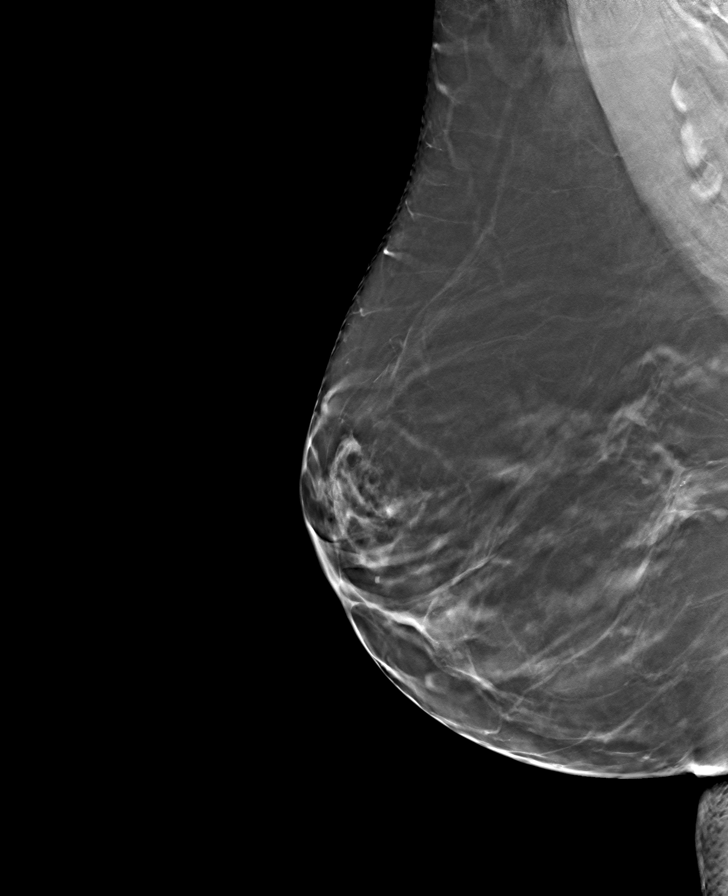

[L CC tomo · tomo slice 38/75.0]
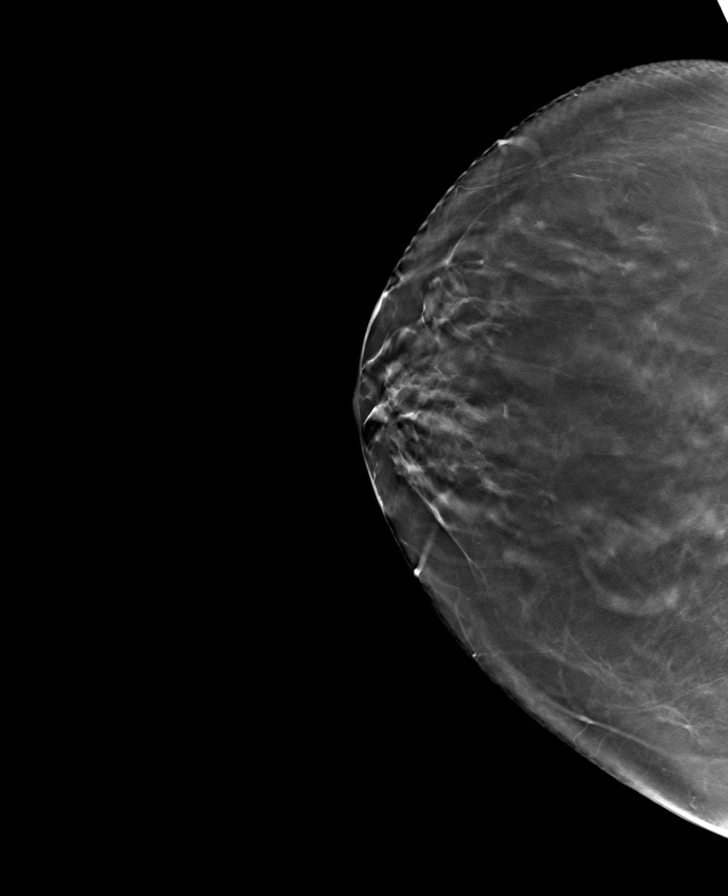

[8 of 24 positions shown; findings below may reference images not displayed]

ACR Breast Density Category b: There are scattered areas of
fibroglandular density.
FINDINGS: There are no findings suspicious for malignancy. The images were
evaluated with computer-aided detection.
IMPRESSION: No mammographic evidence of malignancy. A result letter of this
screening mammogram will be mailed directly to the patient.

RECOMMENDATION:
Screening mammogram in one year. (Code:WJ-[id number])

BI-RADS CATEGORY  1: Negative.

## 2022-11-04 ENCOUNTER — Other Ambulatory Visit: Payer: Self-pay | Admitting: Adult Health

## 2022-11-04 DIAGNOSIS — G43711 Chronic migraine without aura, intractable, with status migrainosus: Secondary | ICD-10-CM

## 2022-11-04 NOTE — Telephone Encounter (Signed)
Hold until seen. Pt will be due for refill after her visit on 11/07/22

## 2022-11-07 ENCOUNTER — Ambulatory Visit: Payer: BC Managed Care – PPO | Admitting: Adult Health

## 2022-11-07 ENCOUNTER — Encounter: Payer: Self-pay | Admitting: Adult Health

## 2022-11-07 VITALS — BP 115/72 | HR 94 | Ht 62.0 in | Wt 225.0 lb

## 2022-11-07 DIAGNOSIS — G43711 Chronic migraine without aura, intractable, with status migrainosus: Secondary | ICD-10-CM | POA: Diagnosis not present

## 2022-11-07 MED ORDER — QULIPTA 30 MG PO TABS: 30.0000 mg | ORAL_TABLET | Freq: Every day | ORAL | 3 refills | Status: DC

## 2022-11-07 NOTE — Patient Instructions (Signed)
Your Plan:  Start Qulipta 30 mg daily  Continue [name] for abortive therapy If your symptoms worsen or you develop new symptoms please let us know.   Thank you for coming to see [place] at [facility]. I hope we have been able to provide you high quality care today.  You may receive a patient satisfaction survey over the next few weeks. We would appreciate your feedback and comments so that we may continue to improve ourselves and the health of our patients.

## 2022-11-07 NOTE — Progress Notes (Signed)
PATIENT: Briana Kelley DOB: 12-07-1960  REASON FOR VISIT: follow up HISTORY FROM: patient  Chief Complaint  Patient presents with   Follow-up    Pt in 19 Pt here for Migraine f/u  Pt states Ajovy is not working and using more of the ubrevly      HISTORY OF PRESENT ILLNESS: Today 11/07/22:  Briana Kelley is a 62 y.o. female with a history of Migraine headache. Returns today for follow-up. Reports 2-3 migraines a month.  Her headaches can sometimes last 1 to 3 days.  She typically has to take 2 doses of Ubrelvy.  She stopped Ajovy several months ago because she did not feel like it was helping.  She has noticed no increase in her migraines since stopping it.  She would like to try a different preventative medication     11/01/21: Briana Kelley is a 62 year old female with a history of migraine headaches.  She returns today for follow-up.  She reports that [name] is working well for her.  She continues to have 1 migraine a month.  [name] also works well for her.  The patient states that her migraines are more intense.  She states that she was working from home full-time but now has to go into the office 3 days a week.  She is not happy about that.  11/01/20: Briana Kelley is a 62 year old female with a history of migraine headaches.  She returns today for follow-up.  She is currently on Ajovy.  She reports that she has approximately 1 migraine a month.  This migraine can sometimes last for days.  She was taking [place] but ran out of the prescription.  She has taken Amerge in the past but felt that [name] works better.  She returns today for an evaluation.  05/03/20: Briana Kelley is a 62 year old female with a history of migraine headaches.  She returns today for follow-up.  At the last visit she was started on [place].  Reports that she received 3 injections in October.  She states that since then she has only had 3 severe headaches.  Reports that she did use Ubrelvy and found it  beneficial.  She is pleased with her current treatment.  She does have Amerge but found that [name] works better.  HISTORY Briana Kelley is a 62 y.o. female here as requested by Laurann Montana, MD for headaches.  She has a PMHx of: Diabetes, hypothyroidism, dry eye,  migraines, hypercholesterolemia, glaucoma, cataracts, depression, sleep apnea status post surgery, iron deficiency anemia, fatty liver.  I reviewed Dr. Lucilla Lame notes: She has had migraines all her life, she is seen multiple neurologists including a neurologist at [facility] Dr. Henry Russel and Dr. Vela Prose in the past.  She is on verapamil and amitriptyline for her migraines and I was able to review prior neurology notes, patient revealed that headaches were getting worse, over 30 days she had 3 severe headaches, 8-12 moderate headaches and at least daily mild headaches, they tend to start moderately and then will build up to a more severe state at times, she is used Migranal nasal spray found it with some benefit, she has used Fioricet sparingly in the past, headaches are often located diffusely, sharp pain with nausea, light and sound sensitivity worsening of the head with movement, pulsating pounding, she has been on triptan's including Relpax, Maxalt, Frova, Zomig and Imitrex and she had difficulty tolerating Imitrex, she is also been tried on Zomig, verapamil.  I also  reviewed neurology examination which was normal including ophthalmologic, cranial nerve, sensory, muscle, cerebellum, gait.  Also physical exam was normal including cardiovascular, respiratory, head, throat, neck, lungs, abdomen.  She did not wish to proceed with Botox.  Last appointment her Zonegran was increased, she was continued on her current dose of verapamil, zonisamide was increased to 400 mg at bedtime, Migranal nasal spray and Fioricet as acute management, also Skelaxin was given for neck muscle tightness.   Patient is here and reports she has had migraines in tension  headaches "all my life", she reports she always has a headache, this has been ongoing since her youngest was born in 1999, she is tried everything verapamil, amitriptyline, ibuprofen, Aleve, Fioricet, Amerge, baclofen, Zofran and nothing is helping. Daily headaches. Migraines start in the temples, can be unilateral or start on both sides, pressure and throbbing, light and sound sensitivity, nausea, hurts to move, at least 10 moderate or severe migraine days a month, migraines can last 24-72 hours, going to bed helps, certain foods such as lots of garlic, processed meats, she has a lot of nausea with the migraines and sharp pains may happen, they can start slowly or some can be acute and severe. She has had multiple brain imaging studies throughout the years which were unremarkable, worse after the meningitis/encephalitis in 1999, over the last several years she has noticed worsening of her symptoms. Very sensitive scalp can;t even touch it it is so sensitive. She had surgery bc of sleep apnea which helped. She couldn't wear the cpap and declines. She gets swelling around the head with severe headaches and lots sensitivity of the scalp. Verapamil helps. No other focal neurologic deficits, associated symptoms, inciting events or modifiable factors. She has no aura, we discussed what an aura is and she does not have them.    From a review of records patient has tried the following medications that can be used in migraine management: Verapamil, amitriptyline, Migranal, ibuprofen, Aleve, Fioricet, Amerge, baclofen, Zofran, Relpax, Maxalt, Frova, Zomig, Imitrex, Zonegran, Skelaxin, Tylenol, promethazine, prozac, Topamax(side effects)and many more things over the years   Reviewed notes, labs and imaging from outside physicians, which showed: tsh 04/2019 normal,     REVIEW OF SYSTEMS: Out of a complete 14 system review of symptoms, the patient complains only of the following symptoms, and all other reviewed systems  are negative.  See HPI  ALLERGIES: Allergies  Allergen Reactions   Aspirin Shortness Of Breath, Nausea Only and Other (See Comments)    "cold sweats and migraines" (05/13/2012) Cold sweats and shakes. Also gets migraine    Celecoxib Shortness Of Breath and Nausea Only    Cold sweats and shakes. Also get migraine    Cinnamon Shortness Of Breath    Choking, coughing    Cinnamon Flavor Anaphylaxis    sneezing   Codeine Nausea Only, Nausea And Vomiting and Shortness Of Breath    Gives her migraines, makes sick   Nitroglycerin Nausea And Vomiting    Chills and migraine; patient stayed in hospital one week after given Nitroglycerin   Nortriptyline Shortness Of Breath    Jaw pain   Oxycodone-Acetaminophen Shortness Of Breath    Cold sweats and shakes. Hallucinations and bleeding    Percocet [Oxycodone-Acetaminophen] Nausea Only and Other (See Comments)    Halucinations; "and blood in my urine" (05/13/2012)   Sumatriptan Shortness Of Breath   Ultram [Tramadol] Shortness Of Breath   Pamelor [Nortriptyline Hcl] Other (See Comments)    Jaw pain  2,4-D Dimethylamine     Cold sweats and shakes.    Jardiance [Empagliflozin]     Yeast infection     Levothyroxine     Ineffective    Tramadol Hcl Nausea Only    Cold sweats and shakes. Also gets migraine   Wellbutrin Xl [Bupropion]     Memory loss    HOME MEDICATIONS: Outpatient Medications Prior to Visit  Medication Sig Dispense Refill   AJOVY 225 MG/1.5ML SOAJ INJECT 675 MG UNDER THE SKIN EVERY 3 MONTHS 4.5 mL 3   amitriptyline (ELAVIL) 25 MG tablet Take 75 mg by mouth at bedtime.      atorvastatin (LIPITOR) 20 MG tablet Take 20 mg by mouth daily.  3   baclofen (LIORESAL) 10 MG tablet TAKE 1 TABLET THREE TIMES A DAY AS NEEDED MUSCLE TIGHTNESS  2   butalbital-acetaminophen-caffeine (FIORICET WITH CODEINE) 50-325-40-30 MG per capsule Take 1 capsule by mouth every 4 (four) hours as needed. For headache     cholecalciferol  (VITAMIN D) 1000 UNITS tablet Take 5,000 Units by mouth 2 (two) times daily.      cycloSPORINE (RESTASIS OP) Place 1 drop into both eyes in the morning and at bedtime.     Flaxseed, Linseed, (FLAX SEED OIL PO) Take 2 tablets by mouth daily.      glimepiride (AMARYL) 4 MG tablet Take 4 mg by mouth 2 (two) times daily.      ibuprofen (ADVIL,MOTRIN) 600 MG tablet Take 1 tablet (600 mg total) by mouth 3 (three) times daily as needed for pain. 30 tablet 0   lisinopril (PRINIVIL,ZESTRIL) 5 MG tablet Take 5 mg by mouth daily.      Lutein-Zeaxanthin 25-5 MG CAPS Take 1 capsule by mouth daily.     metFORMIN (GLUCOPHAGE) 500 MG tablet Take 2,000 mg by mouth daily.     ondansetron (ZOFRAN) 8 MG tablet      pioglitazone (ACTOS) 30 MG tablet Take 30 mg by mouth daily.     Semaglutide (RYBELSUS) 14 MG TABS Take 1 tablet by mouth daily.     thyroid (ARMOUR) 90 MG tablet Take 120 mg by mouth daily.     Ubrogepant (UBRELVY) 50 MG TABS TAKE 1 TABLET BY MOUTH AT ONSET OF MIGRAINE. MAY REPEAT IN 2 HOURS AS NEEDED. ONLY 2 TABLET IN 24 HOURS 15 tablet 3   verapamil (CALAN-SR) 240 MG CR tablet Take 240 mg by mouth 2 (two) times daily.      promethazine (PHENERGAN) 12.5 MG tablet Take 1 tablet (12.5 mg total) by mouth every 6 (six) hours as needed for nausea. 30 tablet 0   No facility-administered medications prior to visit.    PAST MEDICAL HISTORY: Past Medical History:  Diagnosis Date   Asymptomatic gallstones    Bleeding 05/31/2015   Bruises easily    Cataract    Chronic bronchitis (HCC)    "most likely q yr" (05/13/2012)   Encephalitis 1999   Fatty liver    on CT    GERD (gastroesophageal reflux disease)    pt unaware of this    Gestational hypertension 1996   Glaucoma    Grand mal seizure (HCC) 1999   "related to encephalitis and meningitis" (05/13/2012)   Heart murmur    History of blood transfusion 2008   "related to low iron" (05/13/2012)   Hypercholesteremia    Hyperlipidemia     Hypothyroidism    Iron deficiency anemia 2008   Iron deficiency anemia 05/31/2015   Iron malabsorption  05/31/2015   Meningitis    Migraine    "chronic" (05/13/2012)   Multinodular goiter    Dr. Lisabeth Devoid   Pneumonia    "several times" (05/13/2012)   Situational depression 2010   after major surgery, daughters were suicidal   Sleep apnea    does not wear mask   Type II diabetes mellitus (HCC)    Umbilical hernia    small   Vertigo     PAST SURGICAL HISTORY: Past Surgical History:  Procedure Laterality Date   ABDOMINAL HYSTERECTOMY  2008   partial   BILATERAL OOPHERECTOMY     for cysts, puctured cyst, were unable to remove the fibrous tissue   CARPAL TUNNEL RELEASE  1989   left wrist   CESAREAN SECTION  [id number]   CHOLECYSTECTOMY  05/13/2012   laparoscopic   CHOLECYSTECTOMY  05/13/2012   Procedure: LAPAROSCOPIC CHOLECYSTECTOMY WITH INTRAOPERATIVE CHOLANGIOGRAM;  Surgeon: Romie Levee, MD;  Location: [facility] OR;  Service: General;  Laterality: N/A;   COLONOSCOPY  04/13/1960, 03/03/2013   DILATION AND CURETTAGE OF UTERUS  1997; 1998   ESOPHAGOGASTRODUODENOSCOPY  10/2013   HERNIA REPAIR  1997   umbilical   KNEE ARTHROSCOPY W/ MENISCAL REPAIR  1999   left knee   OVARIAN CYST REMOVAL  2009   REDUCTION MAMMAPLASTY Bilateral 2001   breast reduction   TONSILLECTOMY AND ADENOIDECTOMY  ~ 2000   TUBAL LIGATION  1999   WISDOM TOOTH EXTRACTION  1980's?   WRIST SURGERY Left    s/p dislocation, initially closed then open    FAMILY HISTORY: Family History  Problem Relation Age of Onset   Breast cancer Mother    Diabetes Mother    Breast cancer Maternal Aunt    CAD Maternal Grandmother    Diabetes Father    Diabetes Paternal Aunt    Lung cancer Maternal Uncle    Migraines Daughter    Migraines Daughter     SOCIAL HISTORY: Social History   Socioeconomic History   Marital status: Divorced    Spouse name: Not on file   Number of children: 2   Years of education: Not on file    Highest education level: Not on file  Occupational History   Not on file  Tobacco Use   Smoking status: Never   Smokeless tobacco: Never  Vaping Use   Vaping Use: Never used  Substance and Sexual Activity   Alcohol use: No    Alcohol/week: 0.0 standard drinks of alcohol   Drug use: No   Sexual activity: Never    Birth control/protection: Surgical  Other Topics Concern   Not on file  Social History Narrative   Her children live with her    Right handed   Caffeine: 4 cups/day   Social Determinants of Health   Financial Resource Strain: Not on file  Food Insecurity: Not on file  Transportation Needs: Not on file  Physical Activity: Not on file  Stress: Not on file  Social Connections: Not on file  Intimate Partner Violence: Not on file      PHYSICAL EXAM  Vitals:   11/07/22 1438  BP: 115/72  Pulse: 94  Weight: 225 lb (102.1 kg)  Height: 5\' 2"  (1.575 m)    Body mass index is 41.15 kg/m.  Generalized: Well developed, in no acute distress   Neurological examination  Mentation: Alert oriented to time, place, history taking. Follows all commands speech and language fluent Cranial nerve II-XII: Pupils were equal round reactive to light. Extraocular  movements were full, visual field were full on confrontational test. Head turning and shoulder shrug  were normal and symmetric. Motor: The motor testing reveals 5 over 5 strength of all 4 extremities. Good symmetric motor tone is noted throughout.  Sensory: Sensory testing is intact to soft touch on all 4 extremities. No evidence of extinction is noted.  Coordination: Cerebellar testing reveals good finger-nose-finger and heel-to-shin bilaterally.  Gait and station: Gait is normal.    DIAGNOSTIC DATA (LABS, IMAGING, TESTING) - I reviewed patient records, labs, notes, testing and imaging myself where available.  Lab Results  Component Value Date   WBC 9.3 11/17/2017   HGB 14.5 11/17/2017   HCT 44.1 11/17/2017   MCV  91.9 11/17/2017   PLT 299 11/17/2017      Component Value Date/Time   NA 135 (L) 11/18/2016 1029   K 4.8 11/18/2016 1029   CL 105 05/20/2012 0500   CO2 25 11/18/2016 1029   GLUCOSE 213 (H) 11/18/2016 1029   BUN 18.1 11/18/2016 1029   CREATININE 0.8 11/18/2016 1029   CALCIUM 10.6 (H) 11/18/2016 1029   PROT 7.5 11/18/2016 1029   ALBUMIN 3.8 11/18/2016 1029   AST 20 11/18/2016 1029   ALT 26 11/18/2016 1029   ALKPHOS 88 11/18/2016 1029   BILITOT 0.33 11/18/2016 1029   GFRNONAA >90 05/20/2012 0500   GFRAA >90 05/20/2012 0500      ASSESSMENT AND PLAN 62 y.o. year old female  has a past medical history of Asymptomatic gallstones, Bleeding (05/31/2015), Bruises easily, Cataract, Chronic bronchitis (HCC), Encephalitis (1999), Fatty liver, GERD (gastroesophageal reflux disease), Gestational hypertension (1996), Glaucoma, Grand mal seizure (HCC) (1999), Heart murmur, History of blood transfusion (2008), Hypercholesteremia, Hyperlipidemia, Hypothyroidism, Iron deficiency anemia (2008), Iron deficiency anemia (05/31/2015), Iron malabsorption (05/31/2015), Meningitis, Migraine, Multinodular goiter, Pneumonia, Situational depression (2010), Sleep apnea, Type II diabetes mellitus (HCC), Umbilical hernia, and Vertigo. here with:  1.  Migraine headaches  -- Start Qulipta 30 mg daily for prevention --Continue Ubrelvy as an abortive therapy. --Advised that if her headache frequency or severity worsen she should let us know --Follow-up in 6 to 7 months or sooner if needed   Butch Penny, MSN, NP-C 11/07/2022, 2:45 PM [facility] [address], [address] [place], [place] [address] [phone]

## 2022-11-12 DIAGNOSIS — E1169 Type 2 diabetes mellitus with other specified complication: Secondary | ICD-10-CM | POA: Diagnosis not present

## 2022-11-12 DIAGNOSIS — N181 Chronic kidney disease, stage 1: Secondary | ICD-10-CM | POA: Diagnosis not present

## 2022-11-12 DIAGNOSIS — E039 Hypothyroidism, unspecified: Secondary | ICD-10-CM | POA: Diagnosis not present

## 2022-11-12 DIAGNOSIS — E785 Hyperlipidemia, unspecified: Secondary | ICD-10-CM | POA: Diagnosis not present

## 2022-11-22 ENCOUNTER — Encounter: Payer: Self-pay | Admitting: Hematology & Oncology

## 2022-11-22 ENCOUNTER — Other Ambulatory Visit (HOSPITAL_COMMUNITY): Payer: Self-pay

## 2022-11-22 ENCOUNTER — Telehealth: Payer: Self-pay

## 2022-11-22 NOTE — Telephone Encounter (Signed)
Pharmacy Patient Advocate Encounter   Received notification from [employer] that prior authorization for Qulipta 30MG  Tablet is required/requested.   PA submitted to [employer] via Fax [phone] Key or [facility]) confirmation # N/A Status is pending  Faxed PA form along with clinicals.

## 2022-11-29 DIAGNOSIS — R071 Chest pain on breathing: Secondary | ICD-10-CM | POA: Diagnosis not present

## 2022-11-29 DIAGNOSIS — R079 Chest pain, unspecified: Secondary | ICD-10-CM | POA: Diagnosis not present

## 2022-12-02 ENCOUNTER — Other Ambulatory Visit (HOSPITAL_COMMUNITY): Payer: Self-pay

## 2022-12-08 ENCOUNTER — Other Ambulatory Visit: Payer: Self-pay | Admitting: Adult Health

## 2022-12-24 ENCOUNTER — Other Ambulatory Visit (HOSPITAL_COMMUNITY): Payer: Self-pay

## 2022-12-24 NOTE — Telephone Encounter (Signed)
PA was approved. 

## 2023-02-07 DIAGNOSIS — R5382 Chronic fatigue, unspecified: Secondary | ICD-10-CM | POA: Diagnosis not present

## 2023-02-07 DIAGNOSIS — E538 Deficiency of other specified B group vitamins: Secondary | ICD-10-CM | POA: Diagnosis not present

## 2023-02-07 DIAGNOSIS — E063 Autoimmune thyroiditis: Secondary | ICD-10-CM | POA: Diagnosis not present

## 2023-02-07 DIAGNOSIS — Z8639 Personal history of other endocrine, nutritional and metabolic disease: Secondary | ICD-10-CM | POA: Diagnosis not present

## 2023-02-07 DIAGNOSIS — E038 Other specified hypothyroidism: Secondary | ICD-10-CM | POA: Diagnosis not present

## 2023-05-15 DIAGNOSIS — N181 Chronic kidney disease, stage 1: Secondary | ICD-10-CM | POA: Diagnosis not present

## 2023-05-15 DIAGNOSIS — E785 Hyperlipidemia, unspecified: Secondary | ICD-10-CM | POA: Diagnosis not present

## 2023-05-15 DIAGNOSIS — E039 Hypothyroidism, unspecified: Secondary | ICD-10-CM | POA: Diagnosis not present

## 2023-05-15 DIAGNOSIS — E1169 Type 2 diabetes mellitus with other specified complication: Secondary | ICD-10-CM | POA: Diagnosis not present

## 2023-05-15 DIAGNOSIS — D509 Iron deficiency anemia, unspecified: Secondary | ICD-10-CM | POA: Diagnosis not present

## 2023-05-15 DIAGNOSIS — Z23 Encounter for immunization: Secondary | ICD-10-CM | POA: Diagnosis not present

## 2023-05-19 ENCOUNTER — Other Ambulatory Visit (HOSPITAL_COMMUNITY): Payer: Self-pay

## 2023-05-19 ENCOUNTER — Telehealth: Payer: Self-pay

## 2023-05-19 ENCOUNTER — Encounter: Payer: Self-pay | Admitting: Hematology & Oncology

## 2023-05-19 NOTE — Telephone Encounter (Signed)
Pharmacy Patient Advocate Encounter  Received notification from [employer] that Prior Authorization for Ubrelvy 50MG  tablets has been APPROVED from 04/19/2023 to 05/17/2024   PA #/Case ID/Reference #: PA Case ID #: [id number]  Key: [id number]

## 2023-05-22 ENCOUNTER — Ambulatory Visit: Payer: BC Managed Care – PPO | Admitting: Adult Health

## 2023-08-12 DIAGNOSIS — I951 Orthostatic hypotension: Secondary | ICD-10-CM | POA: Diagnosis not present

## 2023-08-12 DIAGNOSIS — G43909 Migraine, unspecified, not intractable, without status migrainosus: Secondary | ICD-10-CM | POA: Diagnosis not present

## 2023-08-12 DIAGNOSIS — F39 Unspecified mood [affective] disorder: Secondary | ICD-10-CM | POA: Diagnosis not present

## 2023-08-12 DIAGNOSIS — R918 Other nonspecific abnormal finding of lung field: Secondary | ICD-10-CM | POA: Diagnosis not present

## 2023-08-12 DIAGNOSIS — N2889 Other specified disorders of kidney and ureter: Secondary | ICD-10-CM | POA: Diagnosis not present

## 2023-08-12 DIAGNOSIS — Z885 Allergy status to narcotic agent status: Secondary | ICD-10-CM | POA: Diagnosis not present

## 2023-08-12 DIAGNOSIS — Z886 Allergy status to analgesic agent status: Secondary | ICD-10-CM | POA: Diagnosis not present

## 2023-08-12 DIAGNOSIS — R768 Other specified abnormal immunological findings in serum: Secondary | ICD-10-CM | POA: Diagnosis not present

## 2023-08-12 DIAGNOSIS — E1169 Type 2 diabetes mellitus with other specified complication: Secondary | ICD-10-CM | POA: Diagnosis not present

## 2023-08-12 DIAGNOSIS — E872 Acidosis, unspecified: Secondary | ICD-10-CM | POA: Diagnosis not present

## 2023-08-12 DIAGNOSIS — G4733 Obstructive sleep apnea (adult) (pediatric): Secondary | ICD-10-CM | POA: Diagnosis not present

## 2023-08-12 DIAGNOSIS — K529 Noninfective gastroenteritis and colitis, unspecified: Secondary | ICD-10-CM | POA: Diagnosis not present

## 2023-08-12 DIAGNOSIS — K6389 Other specified diseases of intestine: Secondary | ICD-10-CM | POA: Diagnosis not present

## 2023-08-12 DIAGNOSIS — E119 Type 2 diabetes mellitus without complications: Secondary | ICD-10-CM | POA: Diagnosis not present

## 2023-08-12 DIAGNOSIS — I959 Hypotension, unspecified: Secondary | ICD-10-CM | POA: Diagnosis not present

## 2023-08-12 DIAGNOSIS — R109 Unspecified abdominal pain: Secondary | ICD-10-CM | POA: Diagnosis not present

## 2023-08-12 DIAGNOSIS — E039 Hypothyroidism, unspecified: Secondary | ICD-10-CM | POA: Diagnosis not present

## 2023-08-12 DIAGNOSIS — D72829 Elevated white blood cell count, unspecified: Secondary | ICD-10-CM | POA: Diagnosis not present

## 2023-08-12 DIAGNOSIS — N289 Disorder of kidney and ureter, unspecified: Secondary | ICD-10-CM | POA: Diagnosis not present

## 2023-08-12 DIAGNOSIS — I10 Essential (primary) hypertension: Secondary | ICD-10-CM | POA: Diagnosis not present

## 2023-08-12 DIAGNOSIS — Z1152 Encounter for screening for COVID-19: Secondary | ICD-10-CM | POA: Diagnosis not present

## 2023-08-12 DIAGNOSIS — R000 Tachycardia, unspecified: Secondary | ICD-10-CM | POA: Diagnosis not present

## 2023-08-12 DIAGNOSIS — K6289 Other specified diseases of anus and rectum: Secondary | ICD-10-CM | POA: Diagnosis not present

## 2023-08-12 DIAGNOSIS — R10819 Abdominal tenderness, unspecified site: Secondary | ICD-10-CM | POA: Diagnosis not present

## 2023-08-12 DIAGNOSIS — A084 Viral intestinal infection, unspecified: Secondary | ICD-10-CM | POA: Diagnosis not present

## 2023-08-12 DIAGNOSIS — Z888 Allergy status to other drugs, medicaments and biological substances status: Secondary | ICD-10-CM | POA: Diagnosis not present

## 2023-08-12 DIAGNOSIS — E785 Hyperlipidemia, unspecified: Secondary | ICD-10-CM | POA: Diagnosis not present

## 2023-08-18 DIAGNOSIS — R918 Other nonspecific abnormal finding of lung field: Secondary | ICD-10-CM | POA: Diagnosis not present

## 2023-08-18 DIAGNOSIS — Z8719 Personal history of other diseases of the digestive system: Secondary | ICD-10-CM | POA: Diagnosis not present

## 2023-08-18 DIAGNOSIS — N181 Chronic kidney disease, stage 1: Secondary | ICD-10-CM | POA: Diagnosis not present

## 2023-08-18 DIAGNOSIS — N2889 Other specified disorders of kidney and ureter: Secondary | ICD-10-CM | POA: Diagnosis not present

## 2023-08-18 DIAGNOSIS — I951 Orthostatic hypotension: Secondary | ICD-10-CM | POA: Diagnosis not present

## 2023-08-28 DIAGNOSIS — M25512 Pain in left shoulder: Secondary | ICD-10-CM | POA: Diagnosis not present

## 2023-10-16 DIAGNOSIS — M25512 Pain in left shoulder: Secondary | ICD-10-CM | POA: Diagnosis not present

## 2023-10-23 DIAGNOSIS — M25512 Pain in left shoulder: Secondary | ICD-10-CM | POA: Diagnosis not present

## 2023-10-28 ENCOUNTER — Other Ambulatory Visit: Payer: Self-pay | Admitting: Adult Health

## 2023-10-31 DIAGNOSIS — M25512 Pain in left shoulder: Secondary | ICD-10-CM | POA: Diagnosis not present

## 2023-11-04 DIAGNOSIS — R059 Cough, unspecified: Secondary | ICD-10-CM | POA: Diagnosis not present

## 2023-11-04 DIAGNOSIS — Z20822 Contact with and (suspected) exposure to covid-19: Secondary | ICD-10-CM | POA: Diagnosis not present

## 2023-11-04 DIAGNOSIS — J069 Acute upper respiratory infection, unspecified: Secondary | ICD-10-CM | POA: Diagnosis not present

## 2023-11-12 DIAGNOSIS — D49512 Neoplasm of unspecified behavior of left kidney: Secondary | ICD-10-CM | POA: Diagnosis not present

## 2023-11-13 DIAGNOSIS — E785 Hyperlipidemia, unspecified: Secondary | ICD-10-CM | POA: Diagnosis not present

## 2023-11-13 DIAGNOSIS — E1169 Type 2 diabetes mellitus with other specified complication: Secondary | ICD-10-CM | POA: Diagnosis not present

## 2023-11-13 DIAGNOSIS — N181 Chronic kidney disease, stage 1: Secondary | ICD-10-CM | POA: Diagnosis not present

## 2023-11-13 DIAGNOSIS — Z23 Encounter for immunization: Secondary | ICD-10-CM | POA: Diagnosis not present

## 2023-11-13 DIAGNOSIS — G43009 Migraine without aura, not intractable, without status migrainosus: Secondary | ICD-10-CM | POA: Diagnosis not present

## 2023-11-14 DIAGNOSIS — M25512 Pain in left shoulder: Secondary | ICD-10-CM | POA: Diagnosis not present

## 2023-11-19 DIAGNOSIS — M25512 Pain in left shoulder: Secondary | ICD-10-CM | POA: Diagnosis not present

## 2023-11-24 ENCOUNTER — Other Ambulatory Visit: Payer: Self-pay | Admitting: *Deleted

## 2023-11-24 MED ORDER — QULIPTA 30 MG PO TABS: 1.0000 | ORAL_TABLET | Freq: Every day | ORAL | 0 refills | Status: DC

## 2023-11-27 DIAGNOSIS — M25512 Pain in left shoulder: Secondary | ICD-10-CM | POA: Diagnosis not present

## 2023-11-28 ENCOUNTER — Other Ambulatory Visit (HOSPITAL_COMMUNITY): Payer: Self-pay | Admitting: Sports Medicine

## 2023-11-28 DIAGNOSIS — M25512 Pain in left shoulder: Secondary | ICD-10-CM

## 2023-12-01 ENCOUNTER — Other Ambulatory Visit: Payer: Self-pay | Admitting: Urology

## 2023-12-01 ENCOUNTER — Ambulatory Visit: Admitting: Adult Health

## 2023-12-01 ENCOUNTER — Encounter: Payer: Self-pay | Admitting: Adult Health

## 2023-12-01 VITALS — BP 126/77 | HR 79 | Ht 62.0 in | Wt 208.0 lb

## 2023-12-01 DIAGNOSIS — G43711 Chronic migraine without aura, intractable, with status migrainosus: Secondary | ICD-10-CM

## 2023-12-01 MED ORDER — QULIPTA 60 MG PO TABS: 60.0000 mg | ORAL_TABLET | Freq: Every day | ORAL | 3 refills | Status: AC

## 2023-12-01 MED ORDER — UBRELVY 100 MG PO TABS: ORAL_TABLET | ORAL | 11 refills | Status: AC

## 2023-12-01 NOTE — Patient Instructions (Signed)
 Your Plan:  -- Increase Qulipta  60 mg daily for prevention -- Increase Ubrelvy  100 mg as an abortive therapy.  Take at the onset of a migraine.  Can repeat in 2 hours if the headache is not resolved.  Only 2 tablets in 24 hours     Thank you for coming to see us  at [facility]. I hope we have been able to provide you high quality care today.  You may receive a patient satisfaction survey over the next few weeks. We would appreciate your feedback and comments so that we may continue to improve ourselves and the health of our patients.

## 2023-12-01 NOTE — Progress Notes (Signed)
 PATIENT: Briana Kelley DOB: 07-24-1960  REASON FOR VISIT: follow up HISTORY FROM: patient  Chief Complaint  Patient presents with   Migraine    Rm 2 alone Pt is well, reports she has about 2 migraines a week. Ubrelvy  does help abort but she has to take two.      HISTORY OF PRESENT ILLNESS: Today 12/01/23:  Briana Kelley is a 63 y.o. female with a history of migraine headaches. Returns today for follow-up.  She reports that she gets approximately 2 migraines a week.  At the last visit she was started on Qulipta .she states that she takes Ubrelvy  for abortive therapy.  Typically requires a second dose.  She does report that she had a drop in her blood pressure while in the hospital visiting her grandson.  She was taken to the ED and upon testing they found kidney cancer.  She will be having surgery in September.  Location: global Frequency: 2 times a week Aura: Only will see flashing lights if it is triggered by a bright light in her vision. Photophonia: yes Phonophobia: yes- depends on the migraine Nausea: with severe headaches Vomiting: only if she can't take Zofran  Numbness: no Weakness: no Visual changes: triggered by bright lights  Gait and balance: no     11/07/22:Briana SHAQUEL Kelley is a 63 y.o. female with a history of Migraine headache. Returns today for follow-up. Reports 2-3 migraines a month.  Her headaches can sometimes last 1 to 3 days.  She typically has to take 2 doses of Ubrelvy .  She stopped Ajovy  several months ago because she did not feel like it was helping.  She has noticed no increase in her migraines since stopping it.  She would like to try a different preventative medication   11/01/21: Ms. Lomax is a 63 year old female with a history of migraine headaches.  She returns today for follow-up.  She reports that Ajovy  is working well for her.  She continues to have 1 migraine a month.  Ubrelvy  also works well for her.  The patient states that her migraines  are more intense.  She states that she was working from home full-time but now has to go into the office 3 days a week.  She is not happy about that.  11/01/20: Ms. Stillson is a 63 year old female with a history of migraine headaches.  She returns today for follow-up.  She is currently on Ajovy .  She reports that she has approximately 1 migraine a month.  This migraine can sometimes last for days.  She was taking Ubrelvy  but ran out of the prescription.  She has taken Amerge in the past but felt that Ubrelvy  works better.  She returns today for an evaluation.  05/03/20: Ms. Yero is a 63 year old female with a history of migraine headaches.  She returns today for follow-up.  At the last visit she was started on Ajovy  and Ubrelvy .  Reports that she received 3 injections in October.  She states that since then she has only had 3 severe headaches.  Reports that she did use Ubrelvy  and found it beneficial.  She is pleased with her current treatment.  She does have Amerge but found that Ubrelvy  works better.  HISTORY Briana Kelley is a 63 y.o. female here as requested by Teresa Montie, MD for headaches.  She has a PMHx of: Diabetes, hypothyroidism, dry eye,  migraines, hypercholesterolemia, glaucoma, cataracts, depression, sleep apnea status post surgery, iron deficiency anemia, fatty liver.  I  reviewed Dr. Joleen notes: She has had migraines all her life, she is seen multiple neurologists including a neurologist at [facility] Dr. Patterson and Dr. Lindy in the past.  She is on verapamil  and amitriptyline  for her migraines and I was able to review prior neurology notes, patient revealed that headaches were getting worse, over 30 days she had 3 severe headaches, 8-12 moderate headaches and at least daily mild headaches, they tend to start moderately and then will build up to a more severe state at times, she is used Migranal nasal spray found it with some benefit, she has used Fioricet  sparingly in the past,  headaches are often located diffusely, sharp pain with nausea, light and sound sensitivity worsening of the head with movement, pulsating pounding, she has been on triptan's including Relpax, Maxalt, Frova, Zomig and Imitrex and she had difficulty tolerating Imitrex, she is also been tried on Zomig, verapamil .  I also reviewed neurology examination which was normal including ophthalmologic, cranial nerve, sensory, muscle, cerebellum, gait.  Also physical exam was normal including cardiovascular, respiratory, head, throat, neck, lungs, abdomen.  She did not wish to proceed with Botox.  Last appointment her Zonegran was increased, she was continued on her current dose of verapamil , zonisamide was increased to 400 mg at bedtime, Migranal nasal spray and Fioricet  as acute management, also Skelaxin was given for neck muscle tightness.   Patient is here and reports she has had migraines in tension headaches all my life, she reports she always has a headache, this has been ongoing since her youngest was born in 1999, she is tried everything verapamil , amitriptyline , ibuprofen , Aleve, Fioricet , Amerge, baclofen, Zofran  and nothing is helping. Daily headaches. Migraines start in the temples, can be unilateral or start on both sides, pressure and throbbing, light and sound sensitivity, nausea, hurts to move, at least 10 moderate or severe migraine days a month, migraines can last 24-72 hours, going to bed helps, certain foods such as lots of garlic, processed meats, she has a lot of nausea with the migraines and sharp pains may happen, they can start slowly or some can be acute and severe. She has had multiple brain imaging studies throughout the years which were unremarkable, worse after the meningitis/encephalitis in 1999, over the last several years she has noticed worsening of her symptoms. Very sensitive scalp can;t even touch it it is so sensitive. She had surgery bc of sleep apnea which helped. She couldn't wear  the cpap and declines. She gets swelling around the head with severe headaches and lots sensitivity of the scalp. Verapamil  helps. No other focal neurologic deficits, associated symptoms, inciting events or modifiable factors. She has no aura, we discussed what an aura is and she does not have them.    From a review of records patient has tried the following medications that can be used in migraine management: Verapamil , amitriptyline , Migranal, ibuprofen , Aleve, Fioricet , Amerge, baclofen, Zofran , Relpax, Maxalt, Frova, Zomig, Imitrex, Zonegran, Skelaxin, Tylenol , promethazine , prozac, Topamax(side effects)and many more things over the years   Reviewed notes, labs and imaging from outside physicians, which showed: tsh 04/2019 normal,     REVIEW OF SYSTEMS: Out of a complete 14 system review of symptoms, the patient complains only of the following symptoms, and all other reviewed systems are negative.  See HPI  ALLERGIES: Allergies  Allergen Reactions   Aspirin Shortness Of Breath, Nausea Only and Other (See Comments)    cold sweats and migraines (05/13/2012) Cold sweats and shakes. Also gets  migraine    Celecoxib Shortness Of Breath and Nausea Only    Cold sweats and shakes. Also get migraine    Cinnamon Shortness Of Breath    Choking, coughing    Cinnamon Flavoring Agent (Non-Screening) Anaphylaxis    sneezing   Codeine Nausea Only, Nausea And Vomiting and Shortness Of Breath    Gives her migraines, makes sick   Nitroglycerin Nausea And Vomiting    Chills and migraine; patient stayed in hospital one week after given Nitroglycerin   Nortriptyline Shortness Of Breath    Jaw pain   Oxycodone-Acetaminophen  Shortness Of Breath    Cold sweats and shakes. Hallucinations and bleeding    Percocet [Oxycodone-Acetaminophen ] Nausea Only and Other (See Comments)    Halucinations; and blood in my urine (05/13/2012)   Sumatriptan Shortness Of Breath   Ultram [Tramadol] Shortness Of  Breath   Pamelor [Nortriptyline Hcl] Other (See Comments)    Jaw pain    2,4-D Dimethylamine     Cold sweats and shakes.    Jardiance [Empagliflozin]     Yeast infection     Levothyroxine     Ineffective    Tramadol Hcl Nausea Only    Cold sweats and shakes. Also gets migraine   Wellbutrin Xl [Bupropion]     Memory loss    HOME MEDICATIONS: Outpatient Medications Prior to Visit  Medication Sig Dispense Refill   amitriptyline  (ELAVIL ) 25 MG tablet Take 75 mg by mouth at bedtime.      Atogepant  (QULIPTA ) 30 MG TABS Take 1 tablet (30 mg total) by mouth daily. Must be seen for further refills. Call [phone]. 90 tablet 0   atorvastatin  (LIPITOR) 20 MG tablet Take 20 mg by mouth daily.  3   baclofen (LIORESAL) 10 MG tablet TAKE 1 TABLET THREE TIMES A DAY AS NEEDED MUSCLE TIGHTNESS  2   butalbital -acetaminophen -caffeine  (FIORICET  WITH CODEINE) 50-325-40-30 MG per capsule Take 1 capsule by mouth every 4 (four) hours as needed. For headache     cholecalciferol (VITAMIN D) 1000 UNITS tablet Take 5,000 Units by mouth 2 (two) times daily.      cycloSPORINE (RESTASIS OP) Place 1 drop into both eyes in the morning and at bedtime.     Flaxseed, Linseed, (FLAX SEED OIL PO) Take 2 tablets by mouth daily.      ibuprofen  (ADVIL ,MOTRIN ) 600 MG tablet Take 1 tablet (600 mg total) by mouth 3 (three) times daily as needed for pain. 30 tablet 0   Lutein-Zeaxanthin 25-5 MG CAPS Take 1 capsule by mouth daily.     metFORMIN  (GLUCOPHAGE ) 500 MG tablet Take 2,000 mg by mouth daily.     ondansetron  (ZOFRAN ) 8 MG tablet      pioglitazone (ACTOS) 30 MG tablet Take 30 mg by mouth daily.     promethazine  (PHENERGAN ) 12.5 MG tablet Take 1 tablet (12.5 mg total) by mouth every 6 (six) hours as needed for nausea. 30 tablet 0   Semaglutide (RYBELSUS) 14 MG TABS Take 1 tablet by mouth daily.     thyroid  (ARMOUR) 90 MG tablet Take 120 mg by mouth daily.     UBRELVY  50 MG TABS TAKE 1 TABLET BY MOUTH AT ONSET OF  MIGRAINE. MAY REPEAT IN 2 HOURS AS NEEDED. ONLY 2 TABLET IN 24 HOURS 15 tablet 3   verapamil  (CALAN -SR) 240 MG CR tablet Take 240 mg by mouth 2 (two) times daily.      AJOVY  225 MG/1.5ML SOAJ INJECT 675 MG UNDER THE SKIN EVERY 3  MONTHS (Patient not taking: Reported on 12/01/2023) 4.5 mL 3   glimepiride  (AMARYL ) 4 MG tablet Take 4 mg by mouth 2 (two) times daily.  (Patient not taking: Reported on 12/01/2023)     lisinopril  (PRINIVIL ,ZESTRIL ) 5 MG tablet Take 5 mg by mouth daily.  (Patient not taking: Reported on 12/01/2023)     No facility-administered medications prior to visit.    PAST MEDICAL HISTORY: Past Medical History:  Diagnosis Date   Asymptomatic gallstones    Bleeding 05/31/2015   Bruises easily    Cataract    Chronic bronchitis (HCC)    most likely q yr (05/13/2012)   Encephalitis 1999   Fatty liver    on CT    GERD (gastroesophageal reflux disease)    pt unaware of this    Gestational hypertension 1996   Glaucoma    Grand mal seizure (HCC) 1999   related to encephalitis and meningitis (05/13/2012)   Heart murmur    History of blood transfusion 2008   related to low iron (05/13/2012)   Hypercholesteremia    Hyperlipidemia    Hypothyroidism    Iron deficiency anemia 2008   Iron deficiency anemia 05/31/2015   Iron malabsorption 05/31/2015   Meningitis    Migraine    chronic (05/13/2012)   Multinodular goiter    Dr. Lavon   Pneumonia    several times (05/13/2012)   Situational depression 2010   after major surgery, daughters were suicidal   Sleep apnea    does not wear mask   Type II diabetes mellitus (HCC)    Umbilical hernia    small   Vertigo     PAST SURGICAL HISTORY: Past Surgical History:  Procedure Laterality Date   ABDOMINAL HYSTERECTOMY  2008   partial   BILATERAL OOPHERECTOMY     for cysts, puctured cyst, were unable to remove the fibrous tissue   CARPAL TUNNEL RELEASE  1989   left wrist   CESAREAN SECTION  [id number]    CHOLECYSTECTOMY  05/13/2012   laparoscopic   CHOLECYSTECTOMY  05/13/2012   Procedure: LAPAROSCOPIC CHOLECYSTECTOMY WITH INTRAOPERATIVE CHOLANGIOGRAM;  Surgeon: Bernarda Ned, MD;  Location: [facility] OR;  Service: General;  Laterality: N/A;   COLONOSCOPY  08/23/1960, 03/03/2013   DILATION AND CURETTAGE OF UTERUS  1997; 1998   ESOPHAGOGASTRODUODENOSCOPY  10/2013   HERNIA REPAIR  1997   umbilical   KNEE ARTHROSCOPY W/ MENISCAL REPAIR  1999   left knee   OVARIAN CYST REMOVAL  2009   REDUCTION MAMMAPLASTY Bilateral 2001   breast reduction   TONSILLECTOMY AND ADENOIDECTOMY  ~ 2000   TUBAL LIGATION  1999   WISDOM TOOTH EXTRACTION  1980's?   WRIST SURGERY Left    s/p dislocation, initially closed then open    FAMILY HISTORY: Family History  Problem Relation Age of Onset   Breast cancer Mother    Diabetes Mother    Breast cancer Maternal Aunt    CAD Maternal Grandmother    Diabetes Father    Diabetes Paternal Aunt    Lung cancer Maternal Uncle    Migraines Daughter    Migraines Daughter     SOCIAL HISTORY: Social History   Socioeconomic History   Marital status: Divorced    Spouse name: Not on file   Number of children: 2   Years of education: Not on file   Highest education level: Not on file  Occupational History   Not on file  Tobacco Use   Smoking status: Never  Smokeless tobacco: Never  Vaping Use   Vaping status: Never Used  Substance and Sexual Activity   Alcohol use: No    Alcohol/week: 0.0 standard drinks of alcohol   Drug use: No   Sexual activity: Never    Birth control/protection: Surgical  Other Topics Concern   Not on file  Social History Narrative   Her children live with her    Right handed   Caffeine : 4 cups/day   Social Drivers of Corporate investment banker Strain: Not on file  Food Insecurity: Not on file  Transportation Needs: Not on file  Physical Activity: Not on file  Stress: Not on file  Social Connections: Not on file  Intimate Partner  Violence: Not on file      PHYSICAL EXAM  Vitals:   12/01/23 1418  BP: 126/77  Pulse: 79  Weight: 208 lb (94.3 kg)  Height: 5' 2 (1.575 m)    Body mass index is 38.04 kg/m.  Generalized: Well developed, in no acute distress   Neurological examination  Mentation: Alert oriented to time, place, history taking. Follows all commands speech and language fluent Cranial nerve II-XII: Pupils were equal round reactive to light. Extraocular movements were full, visual field were full on confrontational test. Head turning and shoulder shrug  were normal and symmetric. Motor: The motor testing reveals 5 over 5 strength of all 4 extremities. Good symmetric motor tone is noted throughout.  Sensory: Sensory testing is intact to soft touch on all 4 extremities. No evidence of extinction is noted.  Coordination: Cerebellar testing reveals good finger-nose-finger and heel-to-shin bilaterally.  Gait and station: Gait is normal.    DIAGNOSTIC DATA (LABS, IMAGING, TESTING) - I reviewed patient records, labs, notes, testing and imaging myself where available.  Lab Results  Component Value Date   WBC 9.3 11/17/2017   HGB 14.5 11/17/2017   HCT 44.1 11/17/2017   MCV 91.9 11/17/2017   PLT 299 11/17/2017      Component Value Date/Time   NA 135 (L) 11/18/2016 1029   K 4.8 11/18/2016 1029   CL 105 05/20/2012 0500   CO2 25 11/18/2016 1029   GLUCOSE 213 (H) 11/18/2016 1029   BUN 18.1 11/18/2016 1029   CREATININE 0.8 11/18/2016 1029   CALCIUM  10.6 (H) 11/18/2016 1029   PROT 7.5 11/18/2016 1029   ALBUMIN 3.8 11/18/2016 1029   AST 20 11/18/2016 1029   ALT 26 11/18/2016 1029   ALKPHOS 88 11/18/2016 1029   BILITOT 0.33 11/18/2016 1029   GFRNONAA >90 05/20/2012 0500   GFRAA >90 05/20/2012 0500      ASSESSMENT AND PLAN 63 y.o. year old female  has a past medical history of Asymptomatic gallstones, Bleeding (05/31/2015), Bruises easily, Cataract, Chronic bronchitis (HCC), Encephalitis  (1999), Fatty liver, GERD (gastroesophageal reflux disease), Gestational hypertension (1996), Glaucoma, Grand mal seizure (HCC) (1999), Heart murmur, History of blood transfusion (2008), Hypercholesteremia, Hyperlipidemia, Hypothyroidism, Iron deficiency anemia (2008), Iron deficiency anemia (05/31/2015), Iron malabsorption (05/31/2015), Meningitis, Migraine, Multinodular goiter, Pneumonia, Situational depression (2010), Sleep apnea, Type II diabetes mellitus (HCC), Umbilical hernia, and Vertigo. here with:  1.  Migraine headaches  -- Increase Qulipta  60 mg daily for prevention -- Increase Ubrelvy  100 mg as an abortive therapy.  Take at the onset of a migraine.  Can repeat in 2 hours if the headache is not resolved.  Only 2 tablets in 24 hours --Advised that if her headache frequency or severity worsen she should let us  know --Follow-up in 1  year or sooner if needed   Duwaine Russell, MSN, NP-C 12/01/2023, 2:28 PM [facility] [address], [address] [place], [place] [address] [phone] The patient's condition requires frequent monitoring and adjustments in the treatment plan, reflecting the ongoing complexity of care.  This provider is the continuing focal point for all needed services for this condition.

## 2023-12-02 ENCOUNTER — Telehealth: Payer: Self-pay

## 2023-12-02 DIAGNOSIS — M25512 Pain in left shoulder: Secondary | ICD-10-CM | POA: Diagnosis not present

## 2023-12-02 NOTE — Telephone Encounter (Signed)
 Pharmacy Patient Advocate Encounter   Received notification from Fax that prior authorization for Qulipta  60mg  Tablet is required/requested.   Insurance verification completed.   The patient is insured through [employer] .   Per test claim: PA required; PA submitted to above mentioned insurance via Fax Key/confirmation #/EOC CASE PI:[id number] Status is pending

## 2023-12-04 NOTE — Telephone Encounter (Addendum)
 Received a faxed request for add info-faxed completed form along with clinicals-awaiting determination

## 2023-12-08 ENCOUNTER — Ambulatory Visit (HOSPITAL_COMMUNITY)
Admission: RE | Admit: 2023-12-08 | Discharge: 2023-12-08 | Disposition: A | Discharge status: Home / Self Care | Source: Ambulatory Visit | Attending: Sports Medicine | Admitting: Sports Medicine

## 2023-12-08 DIAGNOSIS — M25512 Pain in left shoulder: Secondary | ICD-10-CM | POA: Diagnosis not present

## 2023-12-08 DIAGNOSIS — M129 Arthropathy, unspecified: Secondary | ICD-10-CM | POA: Diagnosis not present

## 2023-12-08 DIAGNOSIS — M7582 Other shoulder lesions, left shoulder: Secondary | ICD-10-CM | POA: Diagnosis not present

## 2023-12-10 DIAGNOSIS — M25512 Pain in left shoulder: Secondary | ICD-10-CM | POA: Diagnosis not present

## 2023-12-17 DIAGNOSIS — M25512 Pain in left shoulder: Secondary | ICD-10-CM | POA: Diagnosis not present

## 2023-12-19 DIAGNOSIS — M25512 Pain in left shoulder: Secondary | ICD-10-CM | POA: Diagnosis not present

## 2023-12-25 ENCOUNTER — Other Ambulatory Visit (HOSPITAL_COMMUNITY): Payer: Self-pay

## 2023-12-25 NOTE — Telephone Encounter (Signed)
 PA has been resolved

## 2024-01-02 DIAGNOSIS — M25512 Pain in left shoulder: Secondary | ICD-10-CM | POA: Diagnosis not present

## 2024-01-09 DIAGNOSIS — M25512 Pain in left shoulder: Secondary | ICD-10-CM | POA: Diagnosis not present

## 2024-01-16 DIAGNOSIS — M7552 Bursitis of left shoulder: Secondary | ICD-10-CM | POA: Diagnosis not present

## 2024-01-21 DIAGNOSIS — M25512 Pain in left shoulder: Secondary | ICD-10-CM | POA: Diagnosis not present

## 2024-01-23 DIAGNOSIS — M25512 Pain in left shoulder: Secondary | ICD-10-CM | POA: Diagnosis not present

## 2024-01-23 NOTE — Patient Instructions (Signed)
 SURGICAL WAITING ROOM VISITATION  Patients having surgery or a procedure may have no more than 2 support people in the waiting area - these visitors may rotate.    Children under the age of 74 must have an adult with them who is not the patient.  Visitors with respiratory illnesses are discouraged from visiting and should remain at home.  If the patient needs to stay at the hospital during part of their recovery, the visitor guidelines for inpatient rooms apply. Pre-op nurse will coordinate an appropriate time for 1 support person to accompany patient in pre-op.  This support person may not rotate.    Please refer to the [facility] website for the visitor guidelines for Inpatients (after your surgery is over and you are in a regular room).       Your procedure is scheduled on:  02/05/2024    Report to [facility] Main Entrance    Report to admitting at  0915 AM   Call this number if you have problems the morning of surgery [phone]   Clear liquid diet the day before surgery.              Magnesium citrate - 8 ounces at 1200 noon day before surgery.     Water Non-Citrus Juices (without pulp, NO RED-Apple, White grape, White cranberry) Black Coffee (NO MILK/CREAM OR CREAMERS, sugar ok)  Clear Tea (NO MILK/CREAM OR CREAMERS, sugar ok) regular and decaf                             Plain Jell-O (NO RED)                                           Fruit ices (not with fruit pulp, NO RED)                                     Popsicles (NO RED)                                                               Sports drinks like Gatorade (NO RED)                            If you have questions, please contact your surgeon's office.   FOLLOW BOWEL PREP AND ANY ADDITIONAL PRE OP INSTRUCTIONS YOU RECEIVED FROM YOUR SURGEON'S OFFICE!!!     Oral Hygiene is also important to reduce your risk of infection.                                    Remember - BRUSH YOUR TEETH THE MORNING  OF SURGERY WITH YOUR REGULAR TOOTHPASTE  DENTURES WILL BE REMOVED PRIOR TO SURGERY PLEASE DO NOT APPLY Poly grip OR ADHESIVES!!!   Do NOT smoke after Midnight   Stop all vitamins and herbal supplements 7 days before surgery.   Take these medicines the morning of surgery with  A SIP OF WATER:  eye drops as usual, thyroid  armour, verapamil             Rybelsus-  Metformin - none am of surgery   DO NOT TAKE ANY ORAL DIABETIC MEDICATIONS DAY OF YOUR SURGERY  Bring CPAP mask and tubing day of surgery.                              You may not have any metal on your body including hair pins, jewelry, and body piercing             Do not wear make-up, lotions, powders, perfumes/cologne, or deodorant  Do not wear nail polish including gel and S&S, artificial/acrylic nails, or any other type of covering on natural nails including finger and toenails. If you have artificial nails, gel coating, etc. that needs to be removed by a nail salon please have this removed prior to surgery or surgery may need to be canceled/ delayed if the surgeon/ anesthesia feels like they are unable to be safely monitored.   Do not shave  48 hours prior to surgery.               Men may shave face and neck.   Do not bring valuables to the hospital. [place] IS NOT             RESPONSIBLE   FOR VALUABLES.   Contacts, glasses, dentures or bridgework may not be worn into surgery.   Bring small overnight bag day of surgery.   DO NOT BRING YOUR HOME MEDICATIONS TO THE HOSPITAL. PHARMACY WILL DISPENSE MEDICATIONS LISTED ON YOUR MEDICATION LIST TO YOU DURING YOUR ADMISSION IN THE HOSPITAL!    Patients discharged on the day of surgery will not be allowed to drive home.  Someone NEEDS to stay with you for the first 24 hours after anesthesia.   Special Instructions: Bring a copy of your healthcare power of attorney and living will documents the day of surgery if you haven't scanned them before.              Please  read over the following fact sheets you were given: IF YOU HAVE QUESTIONS ABOUT YOUR PRE-OP INSTRUCTIONS PLEASE CALL [phone].   If you received a COVID test during your pre-op visit  it is requested that you wear a mask when out in public, stay away from anyone that may not be feeling well and notify your surgeon if you develop symptoms. If you test positive for Covid or have been in contact with anyone that has tested positive in the last 10 days please notify you surgeon.    [place] - Preparing for Surgery Before surgery, you can play an important role.  Because skin is not sterile, your skin needs to be as free of germs as possible.  You can reduce the number of germs on your skin by washing with CHG (chlorahexidine gluconate) soap before surgery.  CHG is an antiseptic cleaner which kills germs and bonds with the skin to continue killing germs even after washing. Please DO NOT use if you have an allergy to CHG or antibacterial soaps.  If your skin becomes reddened/irritated stop using the CHG and inform your nurse when you arrive at Short Stay. Do not shave (including legs and underarms) for at least 48 hours prior to the first CHG shower.  You may shave your face/neck. Please follow these instructions carefully:  1.  Shower with CHG Soap the night before surgery and the  morning of Surgery.  2.  If you choose to wash your hair, wash your hair first as usual with your  normal  shampoo.  3.  After you shampoo, rinse your hair and body thoroughly to remove the  shampoo.                           4.  Use CHG as you would any other liquid soap.  You can apply chg directly  to the skin and wash                       Gently with a scrungie or clean washcloth.  5.  Apply the CHG Soap to your body ONLY FROM THE NECK DOWN.   Do not use on face/ open                           Wound or open sores. Avoid contact with eyes, ears mouth and genitals (private parts).                       Wash face,   Genitals (private parts) with your normal soap.             6.  Wash thoroughly, paying special attention to the area where your surgery  will be performed.  7.  Thoroughly rinse your body with warm water from the neck down.  8.  DO NOT shower/wash with your normal soap after using and rinsing off  the CHG Soap.                9.  Pat yourself dry with a clean towel.            10.  Wear clean pajamas.            11.  Place clean sheets on your bed the night of your first shower and do not  sleep with pets. Day of Surgery : Do not apply any lotions/deodorants the morning of surgery.  Please wear clean clothes to the hospital/surgery center.  FAILURE TO FOLLOW THESE INSTRUCTIONS MAY RESULT IN THE CANCELLATION OF YOUR SURGERY PATIENT SIGNATURE_________________________________  NURSE SIGNATURE__________________________________  ________________________________________________________________________

## 2024-01-23 NOTE — Progress Notes (Addendum)
 Anesthesia Review:  PCP: Briana Kelley  Cardiologist : none   PPM/ ICD: Device Orders: Rep Notified:  Chest x-ray : EKG : 08/12/23 at [facility]  Echo : Stress test: Cardiac Cath :   Activity level: can do a flight of stairs without difficulty  Sleep Study/ CPAP : has sleep apnea no cpap  Fasting Blood Sugar :      / Checks Blood Sugar -- times a day:     DM- tye 2 - checks glucose on occasion  Hgba1c-01/27/24- 6.7 Actos- none am of surgery  Rybelsus- ( takes daily ) last dose on 02/04/24 by 0900am  Metformin - none am of surgery   Blood Thinner/ Instructions /Last Dose: ASA / Instructions/ Last Dose :    CBC on 01/27/24 with white count of 12.6 routed to Dr Renda on 01/27/24.    Positive antibodies Type and Screen on 01/27/24.  Type and Screen reordered for DOS.

## 2024-01-26 DIAGNOSIS — M25512 Pain in left shoulder: Secondary | ICD-10-CM | POA: Diagnosis not present

## 2024-01-27 ENCOUNTER — Other Ambulatory Visit: Payer: Self-pay

## 2024-01-27 ENCOUNTER — Encounter (HOSPITAL_COMMUNITY)
Admission: RE | Admit: 2024-01-27 | Discharge: 2024-01-27 | Disposition: A | Discharge status: Home / Self Care | Source: Ambulatory Visit | Attending: Urology | Admitting: Urology

## 2024-01-27 ENCOUNTER — Encounter (HOSPITAL_COMMUNITY): Payer: Self-pay

## 2024-01-27 VITALS — BP 140/67 | HR 89 | Temp 97.8°F | Resp 16 | Ht 62.0 in | Wt 199.0 lb

## 2024-01-27 DIAGNOSIS — D49512 Neoplasm of unspecified behavior of left kidney: Secondary | ICD-10-CM | POA: Diagnosis not present

## 2024-01-27 DIAGNOSIS — N181 Chronic kidney disease, stage 1: Secondary | ICD-10-CM | POA: Diagnosis not present

## 2024-01-27 DIAGNOSIS — I129 Hypertensive chronic kidney disease with stage 1 through stage 4 chronic kidney disease, or unspecified chronic kidney disease: Secondary | ICD-10-CM | POA: Insufficient documentation

## 2024-01-27 DIAGNOSIS — R569 Unspecified convulsions: Secondary | ICD-10-CM | POA: Insufficient documentation

## 2024-01-27 DIAGNOSIS — Z01818 Encounter for other preprocedural examination: Secondary | ICD-10-CM | POA: Insufficient documentation

## 2024-01-27 DIAGNOSIS — E1122 Type 2 diabetes mellitus with diabetic chronic kidney disease: Secondary | ICD-10-CM | POA: Diagnosis not present

## 2024-01-27 DIAGNOSIS — G4733 Obstructive sleep apnea (adult) (pediatric): Secondary | ICD-10-CM | POA: Insufficient documentation

## 2024-01-27 DIAGNOSIS — E039 Hypothyroidism, unspecified: Secondary | ICD-10-CM | POA: Diagnosis not present

## 2024-01-27 DIAGNOSIS — N2889 Other specified disorders of kidney and ureter: Secondary | ICD-10-CM | POA: Insufficient documentation

## 2024-01-27 DIAGNOSIS — G43901 Migraine, unspecified, not intractable, with status migrainosus: Secondary | ICD-10-CM | POA: Insufficient documentation

## 2024-01-27 LAB — BASIC METABOLIC PANEL WITH GFR
Anion gap: 13 (ref 5–15)
BUN: 10 mg/dL (ref 8–23)
CO2: 26 mmol/L (ref 22–32)
Calcium: 9.9 mg/dL (ref 8.9–10.3)
Chloride: 101 mmol/L (ref 98–111)
Creatinine, Ser: 0.55 mg/dL (ref 0.44–1.00)
GFR, Estimated: >60 mL/min (ref >60)
Glucose, Bld: 99 mg/dL (ref 70–99)
Potassium: 4.6 mmol/L (ref 3.5–5.1)
Sodium: 139 mmol/L (ref 135–145)

## 2024-01-27 LAB — CBC
HCT: 47 % — ABNORMAL HIGH (ref 36.0–46.0)
Hemoglobin: 15 g/dL (ref 12.0–15.0)
MCH: 28.3 pg (ref 26.0–34.0)
MCHC: 31.9 g/dL (ref 30.0–36.0)
MCV: 88.7 fL (ref 80.0–100.0)
Platelets: 370 K/uL (ref 150–400)
RBC: 5.3 MIL/uL — ABNORMAL HIGH (ref 3.87–5.11)
RDW: 15.4 % (ref 11.5–15.5)
WBC: 12.6 K/uL — ABNORMAL HIGH (ref 4.0–10.5)
nRBC: 0 % (ref 0.0–0.2)

## 2024-01-27 LAB — GLUCOSE, CAPILLARY: Glucose-Capillary: 108 mg/dL — ABNORMAL HIGH (ref 70–99)

## 2024-01-28 ENCOUNTER — Encounter (HOSPITAL_COMMUNITY): Payer: Self-pay

## 2024-01-28 DIAGNOSIS — M25512 Pain in left shoulder: Secondary | ICD-10-CM | POA: Diagnosis not present

## 2024-01-28 LAB — HEMOGLOBIN A1C
Hgb A1c MFr Bld: 6.7 % — ABNORMAL HIGH (ref 4.8–5.6)
Mean Plasma Glucose: 145.59 mg/dL

## 2024-01-28 NOTE — Anesthesia Preprocedure Evaluation (Addendum)
 Anesthesia Evaluation  Patient identified by MRN, date of birth, ID band Patient awake    Reviewed: Allergy & Precautions, NPO status , Patient's Chart, lab work & pertinent test results  Airway Mallampati: III  TM Distance: >3 FB Neck ROM: Full    Dental  (+) Dental Advisory Given   Pulmonary sleep apnea    breath sounds clear to auscultation       Cardiovascular negative cardio ROS  Rhythm:Regular Rate:Normal     Neuro/Psych  Headaches, Seizures -, Well Controlled,     GI/Hepatic negative GI ROS, Neg liver ROS,,,  Endo/Other  diabetes, Type 2Hypothyroidism    Renal/GU Renal disease     Musculoskeletal   Abdominal   Peds  Hematology  (+) Blood dyscrasia, anemia   Anesthesia Other Findings   Reproductive/Obstetrics                              Anesthesia Physical Anesthesia Plan  ASA: 3  Anesthesia Plan: General   Post-op Pain Management: Ofirmev  IV (intra-op)* and Ketamine  IV*   Induction: Intravenous  PONV Risk Score and Plan: 3 and Dexamethasone , Ondansetron  and Midazolam   Airway Management Planned: Oral ETT  Additional Equipment:   Intra-op Plan:   Post-operative Plan: Extubation in OR  Informed Consent: I have reviewed the patients History and Physical, chart, labs and discussed the procedure including the risks, benefits and alternatives for the proposed anesthesia with the patient or authorized representative who has indicated his/her understanding and acceptance.     Dental advisory given  Plan Discussed with: CRNA  Anesthesia Plan Comments: (See PAT note from 8/26 )         Anesthesia Quick Evaluation

## 2024-01-28 NOTE — Progress Notes (Signed)
 Case: [id number] Date/Time: 02/05/24 1100   Procedure: NEPHRECTOMY, PARTIAL, ROBOT-ASSISTED (Left)   Anesthesia type: General   Diagnosis: Neoplasm of left kidney [D49.512]   Pre-op diagnosis: LEFT RENAL NEOPLASM   Location: [facility] ROOM 03 / [facility]   Surgeons: Briana Glance, MD       DISCUSSION: Briana Kelley is a 63 yo female with PMH of HTN, OSA (no CPAP use), hx of remote seizures, migraines, hypothyroid, T2DM (A1c 6.7), renal mass.  Patient admitted to the ICU in March 2025 at [facility] due to colitis with lactic acidosis briefly requiring pressors.  Also concern for mesenteric ischemia which was ruled out.  Treated with IV fluids and antibiotics and clinically improved.  Imaging was remarkable for incidental renal mass and she is now scheduled for surgery above.  Hx of migraines and follows with Neurology. Also hx of seizures in the setting of meningitis/encephalitis in 1999. She does not take AEDs.  Seen by PCP on 11/13/23. Cleared for surgery: She has already connected with urologist and is anticipating surgical removal of mass. Advised to reach out to nephrologist to connect for additional monitoring due to h/o stage 1 CKD. Patient is cleared medically for upcoming surgery assuming A1C remains at goal today.  VS: BP (!) 140/67   Pulse 89   Temp 36.6 C (Oral)   Resp 16   Ht 5' 2 (1.575 m)   Wt 90.3 kg   SpO2 96%   BMI 36.40 kg/m   PROVIDERS: Briana Montie, MD   LABS: Labs reviewed: Acceptable for surgery. (all labs ordered are listed, but only abnormal results are displayed)  Labs Reviewed  HEMOGLOBIN A1C - Abnormal; Notable for the following components:      Result Value   Hgb A1c MFr Bld 6.7 (*)    All other components within normal limits  CBC - Abnormal; Notable for the following components:   WBC 12.6 (*)    RBC 5.30 (*)    HCT 47.0 (*)    All other components within normal limits  GLUCOSE, CAPILLARY - Abnormal; Notable for the following components:    Glucose-Capillary 108 (*)    All other components within normal limits  BASIC METABOLIC PANEL WITH GFR  TYPE AND SCREEN     IMAGES: CTA chest/thoracicoabdominal aorta 08/12/2023 ([facility]):  Impression: 1.  No CT evidence of acute aortic syndrome. No evidence of arterial bleeding or occlusion. 2.  1.2 cm indeterminate left superior pole renal lesion. Recommend further evaluation with CT renal mass protocol. 3.  Fluid-filled colon, which could be seen with diarrheal illness. No evidence of bowel wall thickening or obstruction. Correlate with symptoms. 4.  Scattered bilateral sub-6 mm pulmonary nodules detailed above. If the patient is at low risk for lung cancer, no further follow-up is recommended. If the patient is at high risk for lung cancer, consider a 55-month follow-up chest CT (2017 Fleischner Guidelines).  EKG 08/12/2023 (while patient was hospitalized at [facility]):  Sinus tachycardia with premature atrial complexes Otherwise normal ECG  CV:  Past Medical History:  Diagnosis Date   Asymptomatic gallstones    Bleeding 05/31/2015   Bruises easily    Cataract    Chronic bronchitis (HCC)    most likely q yr (05/13/2012)   Encephalitis 1999   Fatty liver    on CT    Glaucoma    Grand mal seizure (HCC) 1999   related to encephalitis and meningitis (05/13/2012)   History of blood transfusion 2008   related to low  iron (05/13/2012)   Hypercholesteremia    Hyperlipidemia    Hypothyroidism    Iron deficiency anemia 2008   Iron deficiency anemia 05/31/2015   Iron malabsorption 05/31/2015   Meningitis    Migraine    chronic (05/13/2012)   Multinodular goiter    Dr. Lavon   Pneumonia    several times (05/13/2012)   Sleep apnea    does not wear mask   Type II diabetes mellitus (HCC)    Umbilical hernia    small   Vertigo     Past Surgical History:  Procedure Laterality Date   ABDOMINAL HYSTERECTOMY  2008   partial   BILATERAL OOPHERECTOMY     for  cysts, puctured cyst, were unable to remove the fibrous tissue   CARPAL TUNNEL RELEASE  1989   left wrist   CESAREAN SECTION  [id number]   CHOLECYSTECTOMY  05/13/2012   laparoscopic   CHOLECYSTECTOMY  05/13/2012   Procedure: LAPAROSCOPIC CHOLECYSTECTOMY WITH INTRAOPERATIVE CHOLANGIOGRAM;  Surgeon: Bernarda Ned, MD;  Location: [facility] OR;  Service: General;  Laterality: N/A;   COLONOSCOPY  10/02/1959, 03/03/2013   DILATION AND CURETTAGE OF UTERUS  1997; 1998   ESOPHAGOGASTRODUODENOSCOPY  10/2013   HERNIA REPAIR  1997   umbilical   KNEE ARTHROSCOPY W/ MENISCAL REPAIR  1999   left knee   OVARIAN CYST REMOVAL  2009   REDUCTION MAMMAPLASTY Bilateral 2001   breast reduction   TONSILLECTOMY AND ADENOIDECTOMY  ~ 2000   TUBAL LIGATION  1999   WISDOM TOOTH EXTRACTION  1980's?   WRIST SURGERY Left    s/p dislocation, initially closed then open    MEDICATIONS:  amitriptyline  (ELAVIL ) 25 MG tablet   ARMOUR THYROID  120 MG tablet   Atogepant  (QULIPTA ) 60 MG TABS   atorvastatin  (LIPITOR) 20 MG tablet   baclofen (LIORESAL) 10 MG tablet   butalbital -acetaminophen -caffeine  (FIORICET  WITH CODEINE) 50-325-40-30 MG per capsule   cholecalciferol (VITAMIN D) 1000 UNITS tablet   cycloSPORINE (RESTASIS OP)   Flaxseed, Linseed, (FLAX SEED OIL PO)   ibuprofen  (ADVIL ,MOTRIN ) 600 MG tablet   Lutein-Zeaxanthin 25-5 MG CAPS   metFORMIN  (GLUCOPHAGE ) 500 MG tablet   ondansetron  (ZOFRAN ) 8 MG tablet   pioglitazone (ACTOS) 30 MG tablet   promethazine  (PHENERGAN ) 12.5 MG tablet   Semaglutide (RYBELSUS) 14 MG TABS   thyroid  (ARMOUR) 90 MG tablet   Ubrogepant  (UBRELVY ) 100 MG TABS   verapamil  (CALAN -SR) 240 MG CR tablet   No current facility-administered medications for this encounter.   Briana CHRISTELLA Senna, PA-C [facility] Surgical Short Stay/Anesthesiology [facility] Phone [phone] 01/28/2024 2:22 PM

## 2024-01-30 DIAGNOSIS — B9620 Unspecified Escherichia coli [E. coli] as the cause of diseases classified elsewhere: Secondary | ICD-10-CM | POA: Diagnosis not present

## 2024-01-30 DIAGNOSIS — N390 Urinary tract infection, site not specified: Secondary | ICD-10-CM | POA: Diagnosis not present

## 2024-01-30 DIAGNOSIS — D49512 Neoplasm of unspecified behavior of left kidney: Secondary | ICD-10-CM | POA: Diagnosis not present

## 2024-02-04 NOTE — H&P (Signed)
 Office Visit Report     01/30/2024   --------------------------------------------------------------------------------   Briana Kelley  MRN: [id number]  DOB: [birth year], 63 year old Female  SSN:    PRIMARY CARE:     REFERRING:  Gretel Ferrara, Mickey, M  PROVIDER:  Gretel Ferrara, M.D.  TREATING:  Rodena Expose  LOCATION:  [facility] [phone]     --------------------------------------------------------------------------------   CC/HPI: Left renal mass   Briana Kelley is a 63 year old female who is seen today at the request of Dr. Montie Pizza due to an incidentally detected enhancing small left renal mass. She was recently at [facility] visiting her grandchild that was in the NICU when she developed a syncopal episode and was found to be hypotensive. She was taken to the emergency department and had a CT scan of the chest, abdomen, and pelvis with contrast performed. Incidentally, she was found to have a 1.2 cm hyperdense left renal mass as well as a 1 cm indeterminate right retroperitoneal lesion. She underwent a dedicated renal protocol CT scan subsequently that did confirm that her left renal mass was enhancing with contrast. She was found to have small pulmonary nodules with 3 nodules total all measuring 5 mm or less in diameter.   She has no family history of kidney cancer or end-stage renal disease. She has been diabetic for multiple decades. Serum creatinine most recently was 0.56.   Her surgical history is positive for a laparoscopic cholecystectomy in 2013, a minimally invasive hysterectomy in 2009 followed by an oophorectomy that was complicated by an extended hospital stay and MRSA infection. It was felt that maybe she had an intestinal injury but states that she did not require further surgery after her oophorectomy. She also has had an umbilical hernia repair in the distant past.   01/30/24:   Briana Kelley is here today for pre-op evaluation for planned partial left  nephrectomy with Dr. Ferrara next week. She denies any new chest pain, shortness of breath, recent fevers/illnesses, or gross hematuria. She had her pre-op appointment with the anesthesia department at [facility] earlier this week and received instructions regarding her preparation for surgery including which medications, vitamins and supplements she should stop and when. She is a little anxious regarding the outcome of her surgery.     ALLERGIES: Aspirin Celebrex Cinnamon Codeine Jardiance Levothyroxine Morphine Pamelor Percocet traMADol Ultram    MEDICATIONS: Amitriptyline  HCl 25 MG Tablet 1 tablet PO Daily  Armour Thyroid  120 MG Tablet 1 tablet PO Daily  Atorvastatin  Calcium  20 MG Tablet 1 tablet PO Daily  Flaxseed Oil  Pioglitazone HCl 30 MG Tablet 1 tablet PO Daily  Qulipta  60 MG Tablet  Rybelsus 14 MG Tablet 1 tablet PO Daily  Ubrelvy  100 MG Tablet  Verapamil  HCl ER 240 MG Tablet Extended Release 1 tablet PO Daily  Vitamin B12  Vitamin D3     GU PSH: No GU PSH      PSH Notes: Miniscus repair, 2002  PPP  umbilical hernia repair   NON-GU PSH: Breast Reduction - 2000 Cesarean Delivery - 1999, 1996 Hysterectomy - 2009 Remove Gallbladder - 2013     GU PMH: Left renal neoplasm - 11/12/2023    NON-GU PMH: Cardiac murmur, unspecified Diabetes Type 2 Hypercholesterolemia Hypothyroidism Seizure disorder Sleep Apnea    FAMILY HISTORY: 2 daughters - Runs in Family Breast Cancer - Mother Diabetes - Father, Mother Heart problem - Mother, Father Hypothyroidism - Mother, Father   SOCIAL HISTORY: Marital Status: Divorced  Preferred Language: [place]; Ethnicity: Not Hispanic Or Latino; Race: White Current Smoking Status: Patient has never smoked.   Tobacco Use Assessment Completed: Used Tobacco in last 30 days? Does not use smokeless tobacco. Has never drank.  Does not use drugs. Drinks 1 caffeinated drink per day. Has not had a blood transfusion.    REVIEW OF  SYSTEMS:    Constitutional:   Patient denies fever, night sweats, weight loss, and fatigue.  Respiratory:   Patient denies cough and shortness of breath.  Psychologic:   Patient reports anxiety. Patient denies depression.   VITAL SIGNS:      01/30/2024 10:44 AM  Weight 205 lb / 92.99 kg  Height 62 in / 157.48 cm  BP 113/70 mmHg  Pulse 102 /min  Temperature 98.2 F / 36.7 C  BMI 37.5 kg/m   GU PHYSICAL EXAMINATION:    Cervix: S/P Hysterectomy.   Uterus: S/P Hysterectomy   MULTI-SYSTEM PHYSICAL EXAMINATION:    Constitutional: Well-nourished. No physical deformities. Normally developed. Good grooming.   Respiratory: Normal breath sounds. No labored breathing, no use of accessory muscles.   Cardiovascular: Regular rate and rhythm. No murmur, no gallop. Normal temperature, normal extremity pulses, no swelling, no varicosities.   Neurologic / Psychiatric: Oriented to time, oriented to place, oriented to person. No depression, no anxiety, no agitation.      Complexity of Data:  Source Of History:  Patient, Medical Record Summary  Records Review:   Previous Doctor Records, Previous Patient Records  Urine Test Review:   Urinalysis, Urine Culture   PROCEDURES:          Visit Complexity - G2211          Urinalysis Dipstick Dipstick Cont'd  Color: Yellow Bilirubin: Neg mg/dL  Appearance: Clear Ketones: Neg mg/dL  Specific Gravity: 8.979 Blood: Neg ery/uL  pH: 6.0 Protein: Trace mg/dL  Glucose: Neg mg/dL Urobilinogen: 0.2 mg/dL    Nitrites: Neg    Leukocyte Esterase: Neg leu/uL    ASSESSMENT:      ICD-10 Details  1 GU:   Left renal neoplasm - D49.512 Left, Acute, Threat to Bodily Function   PLAN:            Medications Stop Meds: Qulipta  30 MG Tablet 1 tablet PO Daily  Discontinue: 01/30/2024  - Reason: The medication cycle was completed.  Ubrelvy  50 MG Tablet 1 tablet PO Daily  Discontinue: 01/30/2024  - Reason: The medication cycle was completed.             Schedule Return Visit/Planned Activity: Keep Scheduled Appointment          Document Letter(s):  Created for Patient: Clinical Summary         Notes:   The patient is ready to proceed with her surgery scheduled for 02/05/2024 with Dr. Renda. Urine sent for culture out of an abundance of caution due to upcoming surgery. I will follow-up on the results. Discussed postoperative expectations with the patient. All questions were answered to the best of my ability.   CC: Gretel Renda, MD        Next Appointment:      Next Appointment: 02/05/2024 11:15 AM    Appointment Type: Surgery     Location: [facility] [phone]    Provider: Gretel Renda, M.D.    Reason for Visit: [facility]/OBS (L) RA LAP PARTIAL NEPHRECTOMY WITH AMANDA      * Signed by Rodena Expose on 01/30/24 at 4:47 PM (EDT)*

## 2024-02-05 ENCOUNTER — Ambulatory Visit (HOSPITAL_COMMUNITY): Payer: Self-pay | Admitting: Certified Registered Nurse Anesthetist

## 2024-02-05 ENCOUNTER — Encounter (HOSPITAL_COMMUNITY): Payer: Self-pay | Admitting: Urology

## 2024-02-05 ENCOUNTER — Other Ambulatory Visit: Payer: Self-pay

## 2024-02-05 ENCOUNTER — Observation Stay (HOSPITAL_COMMUNITY)
Admission: RE | Admit: 2024-02-05 | Discharge: 2024-02-06 | Disposition: A | Discharge status: Home / Self Care | Attending: Urology | Admitting: Urology

## 2024-02-05 ENCOUNTER — Ambulatory Visit (HOSPITAL_COMMUNITY): Payer: Self-pay | Admitting: Medical

## 2024-02-05 ENCOUNTER — Encounter (HOSPITAL_COMMUNITY)
Admission: RE | Disposition: A | Discharge status: Home / Self Care | Payer: Self-pay | Source: Home / Self Care | Attending: Urology

## 2024-02-05 DIAGNOSIS — D49512 Neoplasm of unspecified behavior of left kidney: Principal | ICD-10-CM | POA: Diagnosis present

## 2024-02-05 DIAGNOSIS — E119 Type 2 diabetes mellitus without complications: Secondary | ICD-10-CM | POA: Diagnosis not present

## 2024-02-05 DIAGNOSIS — Z01818 Encounter for other preprocedural examination: Secondary | ICD-10-CM

## 2024-02-05 DIAGNOSIS — E039 Hypothyroidism, unspecified: Secondary | ICD-10-CM | POA: Insufficient documentation

## 2024-02-05 DIAGNOSIS — Z79899 Other long term (current) drug therapy: Secondary | ICD-10-CM | POA: Insufficient documentation

## 2024-02-05 DIAGNOSIS — Z7984 Long term (current) use of oral hypoglycemic drugs: Secondary | ICD-10-CM | POA: Diagnosis not present

## 2024-02-05 DIAGNOSIS — C642 Malignant neoplasm of left kidney, except renal pelvis: Secondary | ICD-10-CM | POA: Diagnosis not present

## 2024-02-05 HISTORY — PX: ROBOTIC ASSITED PARTIAL NEPHRECTOMY: SHX6087

## 2024-02-05 LAB — GLUCOSE, CAPILLARY
Glucose-Capillary: 126 mg/dL — ABNORMAL HIGH (ref 70–99)
Glucose-Capillary: 197 mg/dL — ABNORMAL HIGH (ref 70–99)
Glucose-Capillary: 248 mg/dL — ABNORMAL HIGH (ref 70–99)

## 2024-02-05 LAB — TYPE AND SCREEN
ABO/RH(D): O POS
Antibody Screen: POSITIVE
PT AG Type: NEGATIVE
Unit division: 0
Unit division: 0

## 2024-02-05 LAB — BASIC METABOLIC PANEL WITH GFR
Anion gap: 14 (ref 5–15)
BUN: 10 mg/dL (ref 8–23)
CO2: 21 mmol/L — ABNORMAL LOW (ref 22–32)
Calcium: 8.8 mg/dL — ABNORMAL LOW (ref 8.9–10.3)
Chloride: 100 mmol/L (ref 98–111)
Creatinine, Ser: 0.64 mg/dL (ref 0.44–1.00)
GFR, Estimated: >60 mL/min (ref >60)
Glucose, Bld: 223 mg/dL — ABNORMAL HIGH (ref 70–99)
Potassium: 4.2 mmol/L (ref 3.5–5.1)
Sodium: 135 mmol/L (ref 135–145)

## 2024-02-05 LAB — BPAM RBC
Blood Product Expiration Date: 202509282359
Blood Product Expiration Date: 202509282359
Unit Type and Rh: 5100
Unit Type and Rh: 5100

## 2024-02-05 LAB — HEMOGLOBIN AND HEMATOCRIT, BLOOD
HCT: 44.2 % (ref 36.0–46.0)
Hemoglobin: 13.5 g/dL (ref 12.0–15.0)

## 2024-02-05 SURGERY — NEPHRECTOMY, PARTIAL, ROBOT-ASSISTED
Anesthesia: General | Site: Abdomen | Laterality: Left

## 2024-02-05 MED ORDER — ATOGEPANT 60 MG PO TABS: 60.0000 mg | ORAL_TABLET | Freq: Every day | ORAL | Status: DC

## 2024-02-05 MED ORDER — HYDROMORPHONE HCL 1 MG/ML IJ SOLN
0.2500 mg | INTRAMUSCULAR | Status: DC | PRN
  Administered 2024-02-05: 0.5 mg via INTRAVENOUS

## 2024-02-05 MED ORDER — DEXAMETHASONE SODIUM PHOSPHATE 10 MG/ML IJ SOLN
INTRAMUSCULAR | Status: AC
  Filled 2024-02-05: qty 1

## 2024-02-05 MED ORDER — AMISULPRIDE (ANTIEMETIC) 5 MG/2ML IV SOLN: 10.0000 mg | Freq: Once | INTRAVENOUS | Status: DC | PRN

## 2024-02-05 MED ORDER — DIPHENHYDRAMINE HCL 50 MG/ML IJ SOLN: 12.5000 mg | Freq: Four times a day (QID) | INTRAMUSCULAR | Status: DC | PRN

## 2024-02-05 MED ORDER — BUTALBITAL-APAP-CAFF-COD 50-325-40-30 MG PO CAPS: 1.0000 | ORAL_CAPSULE | ORAL | 0 refills | Status: AC | PRN

## 2024-02-05 MED ORDER — ONDANSETRON HCL 4 MG/2ML IJ SOLN
INTRAMUSCULAR | Status: AC
  Filled 2024-02-05: qty 2

## 2024-02-05 MED ORDER — STERILE WATER FOR IRRIGATION IR SOLN
Status: DC | PRN
  Administered 2024-02-05: 1000 mL

## 2024-02-05 MED ORDER — INSULIN ASPART 100 UNIT/ML IJ SOLN: 0.0000 [IU] | Freq: Three times a day (TID) | INTRAMUSCULAR | Status: DC

## 2024-02-05 MED ORDER — FENTANYL CITRATE (PF) 100 MCG/2ML IJ SOLN
INTRAMUSCULAR | Status: DC | PRN
  Administered 2024-02-05: 50 ug via INTRAVENOUS
  Administered 2024-02-05: 100 ug via INTRAVENOUS
  Administered 2024-02-05 (×2): 50 ug via INTRAVENOUS

## 2024-02-05 MED ORDER — HYDROMORPHONE HCL 1 MG/ML IJ SOLN
INTRAMUSCULAR | Status: AC
Start: 2024-02-05 — End: 2024-02-05
  Filled 2024-02-05: qty 1

## 2024-02-05 MED ORDER — ORAL CARE MOUTH RINSE: 15.0000 mL | Freq: Once | OROMUCOSAL | Status: AC

## 2024-02-05 MED ORDER — LACTATED RINGERS IV SOLN: INTRAVENOUS | Status: DC

## 2024-02-05 MED ORDER — BUPIVACAINE LIPOSOME 1.3 % IJ SUSP
INTRAMUSCULAR | Status: AC
  Filled 2024-02-05: qty 20

## 2024-02-05 MED ORDER — BUPIVACAINE LIPOSOME 1.3 % IJ SUSP
INTRAMUSCULAR | Status: DC | PRN
  Administered 2024-02-05: 40 mL

## 2024-02-05 MED ORDER — MAGNESIUM CITRATE PO SOLN: 1.0000 | Freq: Once | ORAL | Status: DC

## 2024-02-05 MED ORDER — ONDANSETRON HCL 4 MG/2ML IJ SOLN
INTRAMUSCULAR | Status: DC | PRN
  Administered 2024-02-05: 4 mg via INTRAVENOUS

## 2024-02-05 MED ORDER — CHLORHEXIDINE GLUCONATE 0.12 % MT SOLN
15.0000 mL | Freq: Once | OROMUCOSAL | Status: AC
  Administered 2024-02-05: 15 mL via OROMUCOSAL

## 2024-02-05 MED ORDER — DOCUSATE SODIUM 100 MG PO CAPS
100.0000 mg | ORAL_CAPSULE | Freq: Two times a day (BID) | ORAL | Status: AC
Start: 2024-02-05 — End: ?

## 2024-02-05 MED ORDER — BACLOFEN 10 MG PO TABS: 10.0000 mg | ORAL_TABLET | Freq: Three times a day (TID) | ORAL | Status: DC | PRN

## 2024-02-05 MED ORDER — THYROID 60 MG PO TABS
120.0000 mg | ORAL_TABLET | Freq: Every day | ORAL | Status: DC
  Administered 2024-02-06: 120 mg via ORAL
  Filled 2024-02-05: qty 2

## 2024-02-05 MED ORDER — ALBUMIN HUMAN 5 % IV SOLN: INTRAVENOUS | Status: DC | PRN

## 2024-02-05 MED ORDER — LACTATED RINGERS IR SOLN
Status: DC | PRN
  Administered 2024-02-05: 1

## 2024-02-05 MED ORDER — DEXAMETHASONE SODIUM PHOSPHATE 10 MG/ML IJ SOLN
INTRAMUSCULAR | Status: DC | PRN
  Administered 2024-02-05: 10 mg via INTRAVENOUS

## 2024-02-05 MED ORDER — DIPHENHYDRAMINE HCL 12.5 MG/5ML PO ELIX: 12.5000 mg | ORAL_SOLUTION | Freq: Four times a day (QID) | ORAL | Status: DC | PRN

## 2024-02-05 MED ORDER — SODIUM CHLORIDE (PF) 0.9 % IJ SOLN
INTRAMUSCULAR | Status: AC
  Filled 2024-02-05: qty 20

## 2024-02-05 MED ORDER — ATORVASTATIN CALCIUM 20 MG PO TABS: 20.0000 mg | ORAL_TABLET | Freq: Every day | ORAL | Status: DC

## 2024-02-05 MED ORDER — ACETAMINOPHEN 10 MG/ML IV SOLN
1000.0000 mg | Freq: Four times a day (QID) | INTRAVENOUS | Status: DC
  Administered 2024-02-05 – 2024-02-06 (×3): 1000 mg via INTRAVENOUS
  Filled 2024-02-05 (×3): qty 100

## 2024-02-05 MED ORDER — KETAMINE HCL 50 MG/5ML IJ SOSY
PREFILLED_SYRINGE | INTRAMUSCULAR | Status: AC
  Filled 2024-02-05: qty 5

## 2024-02-05 MED ORDER — HYDROMORPHONE HCL 1 MG/ML IJ SOLN
0.5000 mg | INTRAMUSCULAR | Status: DC | PRN
  Administered 2024-02-05 (×2): 1 mg via INTRAVENOUS
  Filled 2024-02-05 (×2): qty 1

## 2024-02-05 MED ORDER — FENTANYL CITRATE PF 50 MCG/ML IJ SOSY
25.0000 ug | PREFILLED_SYRINGE | INTRAMUSCULAR | Status: DC | PRN
  Administered 2024-02-05 (×3): 50 ug via INTRAVENOUS

## 2024-02-05 MED ORDER — ROCURONIUM BROMIDE 10 MG/ML (PF) SYRINGE
PREFILLED_SYRINGE | INTRAVENOUS | Status: DC | PRN
  Administered 2024-02-05: 60 mg via INTRAVENOUS
  Administered 2024-02-05 (×2): 10 mg via INTRAVENOUS

## 2024-02-05 MED ORDER — PHENYLEPHRINE HCL-NACL 20-0.9 MG/250ML-% IV SOLN
INTRAVENOUS | Status: DC | PRN
  Administered 2024-02-05: 40 ug/min via INTRAVENOUS
  Administered 2024-02-05: 35 ug/min via INTRAVENOUS

## 2024-02-05 MED ORDER — LIDOCAINE 2% (20 MG/ML) 5 ML SYRINGE
INTRAMUSCULAR | Status: DC | PRN
  Administered 2024-02-05: 60 mg via INTRAVENOUS

## 2024-02-05 MED ORDER — DEXTROSE-SODIUM CHLORIDE 5-0.45 % IV SOLN: INTRAVENOUS | Status: DC

## 2024-02-05 MED ORDER — PROPOFOL 10 MG/ML IV BOLUS
INTRAVENOUS | Status: AC
  Filled 2024-02-05: qty 20

## 2024-02-05 MED ORDER — VERAPAMIL HCL ER 240 MG PO TBCR
240.0000 mg | EXTENDED_RELEASE_TABLET | Freq: Two times a day (BID) | ORAL | Status: DC
  Administered 2024-02-05 – 2024-02-06 (×3): 240 mg via ORAL
  Filled 2024-02-05 (×4): qty 1

## 2024-02-05 MED ORDER — AMITRIPTYLINE HCL 50 MG PO TABS
75.0000 mg | ORAL_TABLET | Freq: Every day | ORAL | Status: DC
  Administered 2024-02-05: 75 mg via ORAL
  Filled 2024-02-05 (×2): qty 1

## 2024-02-05 MED ORDER — BUTALBITAL-APAP-CAFF-COD 50-325-40-30 MG PO CAPS: 1.0000 | ORAL_CAPSULE | ORAL | Status: DC | PRN

## 2024-02-05 MED ORDER — SUGAMMADEX SODIUM 200 MG/2ML IV SOLN
INTRAVENOUS | Status: AC
  Filled 2024-02-05: qty 2

## 2024-02-05 MED ORDER — SUGAMMADEX SODIUM 200 MG/2ML IV SOLN
INTRAVENOUS | Status: DC | PRN
  Administered 2024-02-05: 200 mg via INTRAVENOUS

## 2024-02-05 MED ORDER — LIDOCAINE HCL (PF) 2 % IJ SOLN
INTRAMUSCULAR | Status: AC
  Filled 2024-02-05: qty 5

## 2024-02-05 MED ORDER — MIDAZOLAM HCL 5 MG/5ML IJ SOLN
INTRAMUSCULAR | Status: DC | PRN
  Administered 2024-02-05: 2 mg via INTRAVENOUS

## 2024-02-05 MED ORDER — ATORVASTATIN CALCIUM 20 MG PO TABS
20.0000 mg | ORAL_TABLET | Freq: Every day | ORAL | Status: DC
  Administered 2024-02-05: 20 mg via ORAL
  Filled 2024-02-05: qty 1

## 2024-02-05 MED ORDER — LACTATED RINGERS IV SOLN: INTRAVENOUS | Status: DC | PRN

## 2024-02-05 MED ORDER — DOCUSATE SODIUM 100 MG PO CAPS
100.0000 mg | ORAL_CAPSULE | Freq: Two times a day (BID) | ORAL | Status: DC
  Administered 2024-02-05 – 2024-02-06 (×3): 100 mg via ORAL
  Filled 2024-02-05 (×3): qty 1

## 2024-02-05 MED ORDER — ONDANSETRON HCL 4 MG/2ML IJ SOLN
4.0000 mg | INTRAMUSCULAR | Status: DC | PRN
Start: 2024-02-05 — End: 2024-02-06
  Administered 2024-02-06: 4 mg via INTRAVENOUS
  Filled 2024-02-05: qty 2

## 2024-02-05 MED ORDER — CEFAZOLIN SODIUM-DEXTROSE 2-4 GM/100ML-% IV SOLN
2.0000 g | INTRAVENOUS | Status: AC
  Administered 2024-02-05: 2 g via INTRAVENOUS
  Filled 2024-02-05: qty 100

## 2024-02-05 MED ORDER — ALBUMIN HUMAN 5 % IV SOLN
INTRAVENOUS | Status: AC
  Filled 2024-02-05: qty 250

## 2024-02-05 MED ORDER — FENTANYL CITRATE (PF) 250 MCG/5ML IJ SOLN
INTRAMUSCULAR | Status: AC
  Filled 2024-02-05: qty 5

## 2024-02-05 MED ORDER — FENTANYL CITRATE PF 50 MCG/ML IJ SOSY
PREFILLED_SYRINGE | INTRAMUSCULAR | Status: AC
  Filled 2024-02-05: qty 3

## 2024-02-05 MED ORDER — MIDAZOLAM HCL 2 MG/2ML IJ SOLN
INTRAMUSCULAR | Status: AC
  Filled 2024-02-05: qty 2

## 2024-02-05 MED ORDER — KETAMINE HCL 50 MG/5ML IJ SOSY
PREFILLED_SYRINGE | INTRAMUSCULAR | Status: DC | PRN
  Administered 2024-02-05: 30 mg via INTRAVENOUS

## 2024-02-05 MED ORDER — BUTALBITAL-APAP-CAFFEINE 50-325-40 MG PO TABS: 1.0000 | ORAL_TABLET | ORAL | Status: DC | PRN

## 2024-02-05 MED ORDER — CEFAZOLIN SODIUM-DEXTROSE 1-4 GM/50ML-% IV SOLN
1.0000 g | Freq: Three times a day (TID) | INTRAVENOUS | Status: AC
  Administered 2024-02-05 – 2024-02-06 (×2): 1 g via INTRAVENOUS
  Filled 2024-02-05 (×2): qty 50

## 2024-02-05 MED ORDER — CHLORHEXIDINE GLUCONATE CLOTH 2 % EX PADS
6.0000 | MEDICATED_PAD | Freq: Every day | CUTANEOUS | Status: DC
  Administered 2024-02-05: 6 via TOPICAL

## 2024-02-05 MED ORDER — PROPOFOL 10 MG/ML IV BOLUS
INTRAVENOUS | Status: DC | PRN
  Administered 2024-02-05: 200 mg via INTRAVENOUS

## 2024-02-05 MED ORDER — ROCURONIUM BROMIDE 10 MG/ML (PF) SYRINGE
PREFILLED_SYRINGE | INTRAVENOUS | Status: AC
  Filled 2024-02-05: qty 10

## 2024-02-05 SURGICAL SUPPLY — 50 items
APPLICATOR SURGIFLO ENDO (HEMOSTASIS) IMPLANT
BAG COUNTER SPONGE SURGICOUNT (BAG) IMPLANT
CHLORAPREP W/TINT 26 (MISCELLANEOUS) ×1 IMPLANT
CLIP LIGATING HEM O LOK PURPLE (MISCELLANEOUS) ×1 IMPLANT
CLIP LIGATING HEMO O LOK GREEN (MISCELLANEOUS) ×2 IMPLANT
COVER SURGICAL LIGHT HANDLE (MISCELLANEOUS) ×1 IMPLANT
COVER TIP SHEARS 8 DVNC (MISCELLANEOUS) ×1 IMPLANT
DERMABOND ADVANCED .7 DNX12 (GAUZE/BANDAGES/DRESSINGS) ×1 IMPLANT
DRAIN CHANNEL 15F RND FF 3/16 (WOUND CARE) ×1 IMPLANT
DRAPE ARM DVNC X/XI (DISPOSABLE) ×4 IMPLANT
DRAPE COLUMN DVNC XI (DISPOSABLE) ×1 IMPLANT
DRAPE INCISE IOBAN 66X45 STRL (DRAPES) ×1 IMPLANT
DRAPE SHEET LG 3/4 BI-LAMINATE (DRAPES) ×1 IMPLANT
DRIVER NDL LRG 8 DVNC XI (INSTRUMENTS) ×2 IMPLANT
DRIVER NDLE LRG 8 DVNC XI (INSTRUMENTS) ×2 IMPLANT
ELECT PENCIL ROCKER SW 15FT (MISCELLANEOUS) ×1 IMPLANT
ELECT REM PT RETURN 15FT ADLT (MISCELLANEOUS) ×1 IMPLANT
EVACUATOR SILICONE 100CC (DRAIN) ×1 IMPLANT
FORCEPS BPLR 8 MD DVNC XI (FORCEP) ×1 IMPLANT
FORCEPS PROGRASP DVNC XI (FORCEP) ×1 IMPLANT
GAUZE 4X4 16PLY ~~LOC~~+RFID DBL (SPONGE) ×1 IMPLANT
GLOVE BIO SURGEON STRL SZ 6.5 (GLOVE) ×1 IMPLANT
GLOVE SURG LX STRL 7.5 STRW (GLOVE) ×2 IMPLANT
GOWN SRG XL LVL 4 BRTHBL STRL (GOWNS) ×1 IMPLANT
GOWN STRL REUS W/ TWL XL LVL3 (GOWN DISPOSABLE) ×2 IMPLANT
HEMOSTAT SURGICEL 4X8 (HEMOSTASIS) IMPLANT
HOLDER FOLEY CATH W/STRAP (MISCELLANEOUS) ×1 IMPLANT
IRRIGATION SUCT STRKRFLW 2 WTP (MISCELLANEOUS) ×1 IMPLANT
KIT BASIN OR (CUSTOM PROCEDURE TRAY) ×1 IMPLANT
KIT TURNOVER KIT A (KITS) ×1 IMPLANT
PROTECTOR NERVE ULNAR (MISCELLANEOUS) ×2 IMPLANT
SCISSORS MNPLR CVD DVNC XI (INSTRUMENTS) ×1 IMPLANT
SEAL UNIV 5-12 XI (MISCELLANEOUS) ×4 IMPLANT
SET TUBE SMOKE EVAC HIGH FLOW (TUBING) ×1 IMPLANT
SOLUTION ELECTROSURG ANTI STCK (MISCELLANEOUS) ×1 IMPLANT
SPIKE FLUID TRANSFER (MISCELLANEOUS) ×1 IMPLANT
SURGIFLO W/THROMBIN 8M KIT (HEMOSTASIS) ×1 IMPLANT
SUT ETHILON 3 0 PS 1 (SUTURE) ×1 IMPLANT
SUT MNCRL AB 4-0 PS2 18 (SUTURE) ×2 IMPLANT
SUT PDS PLUS AB 0 CT-2 (SUTURE) ×2 IMPLANT
SUT VIC AB 0 CT1 27XBRD ANTBC (SUTURE) ×1 IMPLANT
SUTURE V-LC BRB 180 2/0GR6GS22 (SUTURE) ×1 IMPLANT
SUTURE VLOC BRB 180 ABS3/0GR12 (SUTURE) ×1 IMPLANT
SYSTEM BAG RETRIEVAL 10MM (BASKET) ×1 IMPLANT
TOWEL OR 17X26 10 PK STRL BLUE (TOWEL DISPOSABLE) ×1 IMPLANT
TRAY FOLEY MTR SLVR 16FR STAT (SET/KITS/TRAYS/PACK) ×1 IMPLANT
TRAY LAPAROSCOPIC (CUSTOM PROCEDURE TRAY) ×1 IMPLANT
TROCAR 12M 150ML BLUNT (TROCAR) IMPLANT
TROCAR Z THREAD OPTICAL 12X100 (TROCAR) ×1 IMPLANT
WATER STERILE IRR 1000ML POUR (IV SOLUTION) ×1 IMPLANT

## 2024-02-05 NOTE — Transfer of Care (Signed)
 Immediate Anesthesia Transfer of Care Note  Patient: Briana Kelley  Procedure(s) Performed: NEPHRECTOMY, PARTIAL, ROBOT-ASSISTED (Left: Abdomen)  Patient Location: PACU  Anesthesia Type:General  Level of Consciousness: drowsy  Airway & Oxygen Therapy: Patient Spontanous Breathing and Patient connected to face mask oxygen  Post-op Assessment: Report given to RN and Post -op Vital signs reviewed and stable  Post vital signs: Reviewed and stable  Last Vitals:  Vitals Value Taken Time  BP 134/75 02/05/24 13:15  Temp    Pulse 79 02/05/24 13:16  Resp 19 02/05/24 13:16  SpO2 95 % 02/05/24 13:16  Vitals shown include unfiled device data.  Last Pain:  Vitals:   02/05/24 0913  TempSrc: Oral         Complications: No notable events documented.

## 2024-02-05 NOTE — Plan of Care (Signed)
   Problem: Clinical Measurements: Goal: Will remain free from infection Outcome: Progressing   Problem: Clinical Measurements: Goal: Diagnostic test results will improve Outcome: Progressing   Problem: Clinical Measurements: Goal: Respiratory complications will improve Outcome: Progressing   Problem: Clinical Measurements: Goal: Cardiovascular complication will be avoided Outcome: Progressing   Problem: Activity: Goal: Risk for activity intolerance will decrease Outcome: Progressing

## 2024-02-05 NOTE — Discharge Instructions (Signed)

## 2024-02-05 NOTE — Interval H&P Note (Signed)
 History and Physical Interval Note:  02/05/2024 10:17 AM  Briana Kelley  has presented today for surgery, with the diagnosis of LEFT RENAL NEOPLASM.  The various methods of treatment have been discussed with the patient and family. After consideration of risks, benefits and other options for treatment, the patient has consented to  Procedure(s): NEPHRECTOMY, PARTIAL, ROBOT-ASSISTED (Left) as a surgical intervention.  The patient's history has been reviewed, patient examined, no change in status, stable for surgery.  I have reviewed the patient's chart and labs.  Questions were answered to the patient's satisfaction.     [employer]

## 2024-02-05 NOTE — Anesthesia Procedure Notes (Addendum)
 Procedure Name: Intubation Date/Time: 02/05/2024 10:34 AM  Performed by: Zulema Leita PARAS, CRNAPre-anesthesia Checklist: Patient identified, Emergency Drugs available, Suction available and Patient being monitored Patient Re-evaluated:Patient Re-evaluated prior to induction Oxygen Delivery Method: Circle System Utilized Preoxygenation: Pre-oxygenation with 100% oxygen Induction Type: IV induction Ventilation: Mask ventilation without difficulty Laryngoscope Size: Mac and 4 Grade View: Grade I Tube type: Oral Tube size: 7.0 mm Number of attempts: 1 Airway Equipment and Method: Stylet and Oral airway Placement Confirmation: ETT inserted through vocal cords under direct vision, positive ETCO2 and breath sounds checked- equal and bilateral Secured at: 22 cm Tube secured with: Tape Dental Injury: Teeth and Oropharynx as per pre-operative assessment

## 2024-02-05 NOTE — Progress Notes (Signed)
 Patient ID: Briana Kelley, female   DOB: 11-16-1960, 63 y.o.   MRN: [id number]  Post-op note  Subjective: The patient is doing well.  No complaints.  Objective: Vital signs in last 24 hours: Temp:  [97.5 F (36.4 C)-98.1 F (36.7 C)] 97.8 F (36.6 C) (09/04 1445) Pulse Rate:  [76-89] 77 (09/04 1445) Resp:  [12-20] 15 (09/04 1445) BP: (133-158)/(67-88) 138/74 (09/04 1445) SpO2:  [92 %-96 %] 94 % (09/04 1445)  Intake/Output from previous day: No intake/output data recorded. Intake/Output this shift: Total I/O In: 2550 [I.V.:2300; IV Piggyback:250] Out: 260 [Urine:150; Drains:60; Blood:50]  Physical Exam:  General: Alert and oriented. Abdomen: Soft, Nondistended. Incisions: Clean and dry.  Lab Results: Recent Labs    02/05/24 1341  HGB 13.5  HCT 44.2    Assessment/Plan: POD#0   1) Continue to monitor, bedrest tonight   Briana Kelley. MD   LOS: 0 days   Briana Kelley 02/05/2024, 3:00 PM

## 2024-02-05 NOTE — Op Note (Signed)
 Preoperative diagnosis: Left renal neoplasm  Postoperative diagnosis: Left renal neoplasm  Procedure:  Left robotic-assisted laparoscopic partial nephrectomy Intraoperative renal ultrasonography  Surgeon: [name]. M.D.  Assistant(s): Alan Hammonds, PA-C  An assistant was required for this surgical procedure.  The duties of the assistant included but were not limited to suctioning, passing suture, camera manipulation, retraction. This procedure would not be able to be performed without an Geophysicist/field seismologist.  Anesthesia: General  Complications: None  EBL: 50 mL  IVF:  2000 mL crystalloid  Specimens: Left renal neoplasm  Disposition of specimens: Pathology  Intraoperative findings:       1. Warm renal ischemia time: 9 minutes       2. Intraoperative renal ultrasound findings: Intraoperative renal ultrasonography was performed.  This confirmed a heterogeneous 1.2 cm mass of the upper pole of the right kidney.  Drains: # 15 Blake perinephric drain  Indication:  Briana Kelley is a 63 y.o. year old patient with a left renal neoplasm.  After a thorough review of the management options for their renal mass, they elected to proceed with surgical treatment and the above procedure.  We have discussed the potential benefits and risks of the procedure, side effects of the proposed treatment, the likelihood of the patient achieving the goals of the procedure, and any potential problems that might occur during the procedure or recuperation. Informed consent has been obtained.   Description of procedure:  The patient was taken to the operating room and a general anesthetic was administered. The patient was given preoperative antibiotics, placed in the left modified flank position with care to pad all potential pressure points, and prepped and draped in the usual sterile fashion. Next a preoperative timeout was performed.  A site was selected in the upper midline for initial port  placement. This was placed using a standard open Hassan technique which allowed entry into the peritoneal cavity under direct vision and without difficulty. A 12 mm port was placed and a pneumoperitoneum established. The camera was then used to inspect the abdomen and there was no evidence of any intra-abdominal injuries or other abnormalities. The remaining abdominal ports were then placed. 8 mm robotic ports were placed in the left upper quadrant, left lower quadrant, and far left lateral abdominal wall. A 8 mm port was placed to the left of the midline just off the rectus muscle for the camera.. All ports were placed under direct vision without difficulty. The surgical cart was then docked.   Utilizing the cautery scissors, the white line of Toldt was incised allowing the colon to be mobilized medially and the plane between the mesocolon and the anterior layer of Gerota's fascia to be developed and the kidney to be exposed.  The ureter and gonadal vein were identified inferiorly and the ureter was lifted anteriorly off the psoas muscle.  Dissection proceeded superiorly along the gonadal vein until the renal vein was identified.  The renal hilum was then carefully isolated with a combination of blunt and sharp dissection allowing the renal arterial and venous structures to be separated and isolated in preparation for renal hilar vessel clamping.  There was a single renal artery and single renal vein.  Attention turned to the kidney and the perinephric fat surrounding the renal mass was removed and the kidney was mobilized sufficiently for exposure and resection of the renal mass.   Intraoperative renal ultrasonography was utilized with the laparoscopic ultrasound probe to identify the renal tumor and identify the tumor margins.  Once the renal mass was properly isolated, preparations were made for resection of the tumor.  Reconstructive sutures were placed into the abdomen for the renorrhaphy portion of  the procedure.  The renal artery was then clamped with bulldog clamps.  The tumor was then excised with cold scissor dissection along with an adequate visible gross margin of normal renal parenchyma. The tumor appeared to be excised without any gross violation of the tumor. The renal collecting system was not entered during removal of the tumor.  A running 3-0 V-lock suture was then brought through the capsule of the kidney and run along the base of the renal defect to provide hemostasis and close any entry into the renal collecting system if present. Weck clips were used to secure this suture outside the renal capsule at the proximal and distal ends. An additional hemostatic agent (Surgiflo) was then placed into the renal defect. A running 2-0 V lock suture was then used to close the renal capsule using a sliding clip technique which resulted in excellent compression of the renal defect.    The bulldog clamps were then removed from the renal hilar vessel(s). Total warm renal ischemia time was 9 minutes. The renal tumor resection site was examined. Hemostasis appeared adequate.   The kidney was placed back into its normal anatomic position and covered with perinephric fat as needed.  A # 15 Blake drain was then brought through the lateral lower port site and positioned in the perinephric space.  It was secured to the skin with a nylon suture. The surgical cart was undocked.  The renal tumor specimen was removed intact within an endopouch retrieval bag via the upper midline port site.  All other laparoscopic/robotic ports had been removed under direct vision and the pneumoperitoneum let down with inspection of the operative field performed and hemostasis again confirmed. The upper midline incision was then closed at the fascial level with 0-vicryl suture. All incision sites were then injected with local anesthetic and reapproximated at the skin level with 4-0 monocryl subcuticular closures.  Dermabond was applied  to the skin.  The patient tolerated the procedure well and without complications.  The patient was able to be extubated and transferred to the recovery unit in satisfactory condition.  Gretel CANDIE Renda Teddie MD

## 2024-02-06 ENCOUNTER — Encounter (HOSPITAL_COMMUNITY): Payer: Self-pay | Admitting: Urology

## 2024-02-06 DIAGNOSIS — E119 Type 2 diabetes mellitus without complications: Secondary | ICD-10-CM | POA: Diagnosis not present

## 2024-02-06 DIAGNOSIS — Z79899 Other long term (current) drug therapy: Secondary | ICD-10-CM | POA: Diagnosis not present

## 2024-02-06 DIAGNOSIS — E039 Hypothyroidism, unspecified: Secondary | ICD-10-CM | POA: Diagnosis not present

## 2024-02-06 DIAGNOSIS — C642 Malignant neoplasm of left kidney, except renal pelvis: Secondary | ICD-10-CM | POA: Diagnosis not present

## 2024-02-06 DIAGNOSIS — Z7984 Long term (current) use of oral hypoglycemic drugs: Secondary | ICD-10-CM | POA: Diagnosis not present

## 2024-02-06 LAB — GLUCOSE, CAPILLARY
Glucose-Capillary: 151 mg/dL — ABNORMAL HIGH (ref 70–99)
Glucose-Capillary: 166 mg/dL — ABNORMAL HIGH (ref 70–99)
Glucose-Capillary: 125 mg/dL — ABNORMAL HIGH (ref 70–99)

## 2024-02-06 LAB — BASIC METABOLIC PANEL WITH GFR
Anion gap: 13 (ref 5–15)
BUN: 9 mg/dL (ref 8–23)
CO2: 21 mmol/L — ABNORMAL LOW (ref 22–32)
Calcium: 8.7 mg/dL — ABNORMAL LOW (ref 8.9–10.3)
Chloride: 98 mmol/L (ref 98–111)
Creatinine, Ser: 0.61 mg/dL (ref 0.44–1.00)
GFR, Estimated: >60 mL/min (ref >60)
Glucose, Bld: 174 mg/dL — ABNORMAL HIGH (ref 70–99)
Potassium: 4.5 mmol/L (ref 3.5–5.1)
Sodium: 132 mmol/L — ABNORMAL LOW (ref 135–145)

## 2024-02-06 LAB — HEMOGLOBIN AND HEMATOCRIT, BLOOD
HCT: 41.6 % (ref 36.0–46.0)
Hemoglobin: 12.6 g/dL (ref 12.0–15.0)

## 2024-02-06 LAB — CREATININE, FLUID (PLEURAL, PERITONEAL, JP DRAINAGE): Creat, Fluid: 0.6 mg/dL

## 2024-02-06 MED ORDER — BISACODYL 10 MG RE SUPP
10.0000 mg | Freq: Once | RECTAL | Status: AC
  Administered 2024-02-06: 10 mg via RECTAL
  Filled 2024-02-06: qty 1

## 2024-02-06 MED ORDER — ACETAMINOPHEN 500 MG PO TABS
500.0000 mg | ORAL_TABLET | Freq: Four times a day (QID) | ORAL | Status: DC | PRN
  Administered 2024-02-06: 500 mg via ORAL
  Filled 2024-02-06: qty 1

## 2024-02-06 MED ORDER — SODIUM CHLORIDE 0.45 % IV SOLN: INTRAVENOUS | Status: DC

## 2024-02-06 MED ORDER — INSULIN ASPART 100 UNIT/ML IJ SOLN
0.0000 [IU] | INTRAMUSCULAR | Status: DC
  Administered 2024-02-06 (×2): 3 [IU] via SUBCUTANEOUS
  Administered 2024-02-06: 2 [IU] via SUBCUTANEOUS

## 2024-02-06 NOTE — Progress Notes (Signed)
 On-call urology provider returned call with order to change IV fluids to 1/2 NS, maintaining the same infusion reate. Order implemented as directed.

## 2024-02-06 NOTE — Progress Notes (Signed)
 Blood glucose noted to be elevated. Patient currently receiving D5 1/2 NS at 150 ml/hr per active hour. On-call urology provider contacted regarding elevated glucose and requested to change IV fluids. Awaiting updated orders.

## 2024-02-06 NOTE — Progress Notes (Signed)
 Contacted the on call urology provider regarding patient with T2DM. No current orders or parameters in place to check blood glucose. Sliding scale insulin  orders are now available with no bedtime coverage.

## 2024-02-06 NOTE — Anesthesia Postprocedure Evaluation (Signed)
 Anesthesia Post Note  Patient: Briana Kelley  Procedure(s) Performed: NEPHRECTOMY, PARTIAL, ROBOT-ASSISTED (Left: Abdomen)     Patient location during evaluation: PACU Anesthesia Type: General Level of consciousness: awake and alert Pain management: pain level controlled Vital Signs Assessment: post-procedure vital signs reviewed and stable Respiratory status: spontaneous breathing, nonlabored ventilation, respiratory function stable and patient connected to nasal cannula oxygen Cardiovascular status: blood pressure returned to baseline and stable Postop Assessment: no apparent nausea or vomiting Anesthetic complications: no   No notable events documented.  Last Vitals:  Vitals:   02/06/24 0700 02/06/24 0800  BP: 100/71 116/65  Pulse: 72 63  Resp: 17   Temp: 37.1 C   SpO2: 96% 93%    Last Pain:  Vitals:   02/06/24 0700  TempSrc: Oral  PainSc:                  Ezell Melikian E

## 2024-02-06 NOTE — Progress Notes (Signed)
 Patient ID: Briana Kelley, female   DOB: 1960-09-06, 63 y.o.   MRN: [id number]  1 Day Post-Op Subjective: Pt doing well.  Pain controlled.  Objective: Vital signs in last 24 hours: Temp:  [97.5 F (36.4 C)-98.2 F (36.8 C)] 98.2 F (36.8 C) (09/05 0421) Pulse Rate:  [64-93] 64 (09/05 0421) Resp:  [12-20] 15 (09/05 0421) BP: (117-158)/(63-88) 121/64 (09/05 0421) SpO2:  [91 %-96 %] 95 % (09/05 0421) Weight:  [98.2 kg] 98.2 kg (09/04 1557)  Intake/Output from previous day: 09/04 0701 - 09/05 0700 In: 5652.1 [P.O.:480; I.V.:4632.5; IV Piggyback:539.6] Out: 2750 [Urine:2500; Drains:200; Blood:50] Intake/Output this shift: No intake/output data recorded.  Physical Exam:  General: Alert and oriented CV: RRR Lungs: Clear Abdomen: Soft, ND, positive BS Incisions: C/D/I Ext: NT, No erythema  Lab Results: Recent Labs    02/05/24 1341 02/06/24 0345  HGB 13.5 12.6  HCT 44.2 41.6   BMET Recent Labs    02/05/24 1341 02/06/24 0345  NA 135 132*  K 4.2 4.5  CL 100 98  CO2 21* 21*  GLUCOSE 223* 174*  BUN 10 9  CREATININE 0.64 0.61  CALCIUM  8.8* 8.7*     Studies/Results: No results found.  Assessment/Plan: POD # 1 s/p left RAL partial nephrectomy - Ambulate, IS - Advance diet - D/C foley - Check drain Cr - Oral pain medication - Evaluate for discharge this afternoon   LOS: 0 days   Noretta Ferrara 02/06/2024, 7:50 AM

## 2024-02-06 NOTE — Discharge Summary (Signed)
 Date of admission: 02/05/2024  Date of discharge: 02/06/2024  Admission diagnosis: Left renal mass  Discharge diagnosis: Left renal mass  Secondary diagnoses: Diabetes  History and Physical: For full details, please see admission history and physical. Briefly, Briana Kelley is a 63 y.o. year old patient with an incidentally detected 1.2 cm left renal mass.   Hospital Course: She was taken to the operating room on 02/05/2023 and underwent a left robot-assisted laparoscopic partial nephrectomy.  She tolerated this procedure well without complications.  She was monitored overnight and remained hemodynamically stable.  She was able to begin ambulating the following day and her pain was controlled with Tylenol .  Her drain creatinine was consistent with serum and her drain was removed.  She was able to be discharged home on the afternoon of postoperative day 1.  Laboratory values:  Recent Labs    02/05/24 1341 02/06/24 0345  HGB 13.5 12.6  HCT 44.2 41.6   Recent Labs    02/05/24 1341 02/06/24 0345  CREATININE 0.64 0.61    Disposition: Home  Discharge instruction: The patient was instructed to be ambulatory but told to refrain from heavy lifting, strenuous activity, or driving.   Discharge medications:  Allergies as of 02/06/2024       Reactions   Aspirin Shortness Of Breath, Nausea Only, Other (See Comments)   cold sweats and migraines (05/13/2012) Cold sweats and shakes. Also gets migraine   Celecoxib Shortness Of Breath, Nausea Only   Cold sweats and shakes. Also get migraine   Cinnamon Shortness Of Breath   Choking, coughing   Cinnamon Flavoring Agent (non-screening) Anaphylaxis   sneezing   Codeine Nausea Only, Nausea And Vomiting, Shortness Of Breath   Gives her migraines, makes sick   Nitroglycerin Nausea And Vomiting   Chills and migraine; patient stayed in hospital one week after given Nitroglycerin   Nortriptyline Shortness Of Breath   Jaw pain    Oxycodone-acetaminophen  Shortness Of Breath   Cold sweats and shakes. Hallucinations and bleeding   Percocet [oxycodone-acetaminophen ] Nausea Only, Other (See Comments)   Halucinations; and blood in my urine (05/13/2012)   Sumatriptan Shortness Of Breath   Ultram [tramadol] Shortness Of Breath   Pamelor [nortriptyline Hcl] Other (See Comments)   Jaw pain   2,4-d Dimethylamine    Cold sweats and shakes.    Jardiance [empagliflozin]    Yeast infection   Levothyroxine    Ineffective    Tramadol Hcl Nausea Only   Cold sweats and shakes. Also gets migraine   Wellbutrin Xl [bupropion]    Memory loss        Medication List     STOP taking these medications    cholecalciferol 1000 units tablet Commonly known as: VITAMIN D   FLAX SEED OIL PO   ibuprofen  600 MG tablet Commonly known as: ADVIL    Lutein-Zeaxanthin 25-5 MG Caps       TAKE these medications    amitriptyline  25 MG tablet Commonly known as: ELAVIL  Take 75 mg by mouth at bedtime.   Armour Thyroid  120 MG tablet Generic drug: thyroid  Take 120 mg by mouth daily.   atorvastatin  20 MG tablet Commonly known as: LIPITOR Take 20 mg by mouth daily.   baclofen  10 MG tablet Commonly known as: LIORESAL  TAKE 1 TABLET THREE TIMES A DAY AS NEEDED MUSCLE TIGHTNESS   butalbital -apap-caffeine -codeine 50-325-40-30 MG capsule Commonly known as: FIORICET  WITH CODEINE Take 1 capsule by mouth every 4 (four) hours as needed. For headache  docusate sodium  100 MG capsule Commonly known as: COLACE Take 1 capsule (100 mg total) by mouth 2 (two) times daily.   metFORMIN  500 MG tablet Commonly known as: GLUCOPHAGE  Take 2,000 mg by mouth every evening.   ondansetron  8 MG tablet Commonly known as: ZOFRAN  Take 8 mg by mouth daily as needed for nausea or vomiting.   pioglitazone 30 MG tablet Commonly known as: ACTOS Take 30 mg by mouth daily.   promethazine  12.5 MG tablet Commonly known as: PHENERGAN  Take 1 tablet  (12.5 mg total) by mouth every 6 (six) hours as needed for nausea.   Qulipta  60 MG Tabs Generic drug: Atogepant  Take 1 tablet (60 mg total) by mouth daily.   Rybelsus 14 MG Tabs Generic drug: Semaglutide Take 1 tablet by mouth daily.   Ubrelvy  100 MG Tabs Generic drug: Ubrogepant  Take one tablet at the onset of migraine. Can repeat in 2 hours if need. Only 2 tabs in 24 hours.   verapamil  240 MG CR tablet Commonly known as: CALAN -SR Take 240 mg by mouth 2 (two) times daily.        Followup:   Follow-up Information     Renda Glance, MD Follow up on 03/02/2024.   Specialty: Urology Why: at 1:45 Contact information: [address] [place] [address] [phone]

## 2024-02-09 LAB — TYPE AND SCREEN
ABO/RH(D): O POS
Antibody Screen: POSITIVE
Donor AG Type: NEGATIVE
Donor AG Type: NEGATIVE
Unit division: 0
Unit division: 0

## 2024-02-09 LAB — BPAM RBC
Blood Product Expiration Date: 202509282359
Blood Product Expiration Date: 202509282359
Unit Type and Rh: 5100
Unit Type and Rh: 5100

## 2024-02-10 LAB — SURGICAL PATHOLOGY

## 2024-02-17 DIAGNOSIS — M25512 Pain in left shoulder: Secondary | ICD-10-CM | POA: Diagnosis not present

## 2024-03-02 DIAGNOSIS — C642 Malignant neoplasm of left kidney, except renal pelvis: Secondary | ICD-10-CM | POA: Diagnosis not present

## 2024-05-13 DIAGNOSIS — E1169 Type 2 diabetes mellitus with other specified complication: Secondary | ICD-10-CM | POA: Diagnosis not present

## 2024-05-13 DIAGNOSIS — Z23 Encounter for immunization: Secondary | ICD-10-CM | POA: Diagnosis not present

## 2024-05-13 DIAGNOSIS — E039 Hypothyroidism, unspecified: Secondary | ICD-10-CM | POA: Diagnosis not present

## 2024-05-13 DIAGNOSIS — E785 Hyperlipidemia, unspecified: Secondary | ICD-10-CM | POA: Diagnosis not present

## 2024-05-13 DIAGNOSIS — N181 Chronic kidney disease, stage 1: Secondary | ICD-10-CM | POA: Diagnosis not present

## 2024-05-20 ENCOUNTER — Telehealth: Payer: Self-pay | Admitting: Pharmacist

## 2024-05-20 NOTE — Telephone Encounter (Signed)
 Pharmacy Patient Advocate Encounter   Received notification from CoverMyMeds that prior authorization for Ubrelvy  50mg  is due for renewal.   Insurance verification completed.   The patient is insured through [employer].  Medication dose has been changed based on most recent chart notes, patient now on 100mg .

## 2024-05-28 ENCOUNTER — Other Ambulatory Visit (HOSPITAL_COMMUNITY): Payer: Self-pay

## 2024-05-28 ENCOUNTER — Telehealth: Payer: Self-pay

## 2024-05-28 NOTE — Telephone Encounter (Signed)
 Pharmacy Patient Advocate Encounter  Received notification from [employer] that Prior Authorization for Ubrelvy  100mg  has been APPROVED from 05/28/2024 to 05/28/2025. Ran test claim, Copay is $0/30DS/10 Tablets. This test claim was processed through [facility]- copay amounts may vary at other pharmacies due to pharmacy/plan contracts, or as the patient moves through the different stages of their insurance plan.   PA #/Case ID/Reference #: [id number]

## 2024-11-29 ENCOUNTER — Ambulatory Visit: Admitting: Adult Health
